# Patient Record
Sex: Female | Born: 1975 | Race: Black or African American | Hispanic: No | Marital: Single | Smoking: Current every day smoker
Health system: Southern US, Community
[De-identification: ages and names within clinical notes are randomized; demographics above are authoritative.]

## PROBLEM LIST (undated history)

## (undated) DIAGNOSIS — L732 Hidradenitis suppurativa: Secondary | ICD-10-CM

## (undated) DIAGNOSIS — M549 Dorsalgia, unspecified: Secondary | ICD-10-CM

## (undated) DIAGNOSIS — Z720 Tobacco use: Secondary | ICD-10-CM

## (undated) DIAGNOSIS — E669 Obesity, unspecified: Secondary | ICD-10-CM

## (undated) DIAGNOSIS — E119 Type 2 diabetes mellitus without complications: Secondary | ICD-10-CM

## (undated) DIAGNOSIS — F319 Bipolar disorder, unspecified: Secondary | ICD-10-CM

## (undated) DIAGNOSIS — F3289 Other specified depressive episodes: Secondary | ICD-10-CM

## (undated) DIAGNOSIS — J40 Bronchitis, not specified as acute or chronic: Secondary | ICD-10-CM

## (undated) DIAGNOSIS — O24419 Gestational diabetes mellitus in pregnancy, unspecified control: Secondary | ICD-10-CM

## (undated) DIAGNOSIS — F329 Major depressive disorder, single episode, unspecified: Secondary | ICD-10-CM

## (undated) HISTORY — DX: Tobacco use: Z72.0

## (undated) HISTORY — DX: Type 2 diabetes mellitus without complications: E11.9

## (undated) HISTORY — PX: TUBAL LIGATION: SHX77

## (undated) HISTORY — DX: Bipolar disorder, unspecified: F31.9

## (undated) HISTORY — DX: Major depressive disorder, single episode, unspecified: F32.9

## (undated) HISTORY — DX: Obesity, unspecified: E66.9

## (undated) HISTORY — DX: Other specified depressive episodes: F32.89

## (undated) HISTORY — DX: Hidradenitis suppurativa: L73.2

---

## 1998-02-19 ENCOUNTER — Emergency Department (HOSPITAL_COMMUNITY): Admission: EM | Admit: 1998-02-19 | Discharge: 1998-02-19 | Payer: Self-pay | Admitting: Internal Medicine

## 1999-06-16 ENCOUNTER — Other Ambulatory Visit: Admission: RE | Admit: 1999-06-16 | Discharge: 1999-06-16 | Payer: Self-pay | Admitting: Obstetrics and Gynecology

## 2000-10-23 ENCOUNTER — Emergency Department (HOSPITAL_COMMUNITY): Admission: EM | Admit: 2000-10-23 | Discharge: 2000-10-23 | Payer: Self-pay | Admitting: Emergency Medicine

## 2001-01-02 ENCOUNTER — Encounter: Admission: RE | Admit: 2001-01-02 | Discharge: 2001-01-02 | Payer: Self-pay | Admitting: Family Medicine

## 2001-01-02 ENCOUNTER — Other Ambulatory Visit: Admission: RE | Admit: 2001-01-02 | Discharge: 2001-01-02 | Payer: Self-pay | Admitting: *Deleted

## 2001-02-14 ENCOUNTER — Encounter: Admission: RE | Admit: 2001-02-14 | Discharge: 2001-02-14 | Payer: Self-pay | Admitting: Family Medicine

## 2001-02-14 ENCOUNTER — Other Ambulatory Visit: Admission: RE | Admit: 2001-02-14 | Discharge: 2001-02-14 | Payer: Self-pay | Admitting: Family Medicine

## 2001-02-24 ENCOUNTER — Encounter: Admission: RE | Admit: 2001-02-24 | Discharge: 2001-02-24 | Payer: Self-pay | Admitting: Family Medicine

## 2001-05-19 ENCOUNTER — Inpatient Hospital Stay (HOSPITAL_COMMUNITY): Admission: AD | Admit: 2001-05-19 | Discharge: 2001-05-19 | Payer: Self-pay | Admitting: *Deleted

## 2001-07-03 ENCOUNTER — Encounter: Admission: RE | Admit: 2001-07-03 | Discharge: 2001-07-03 | Payer: Self-pay | Admitting: Family Medicine

## 2001-08-04 ENCOUNTER — Emergency Department (HOSPITAL_COMMUNITY): Admission: EM | Admit: 2001-08-04 | Discharge: 2001-08-04 | Payer: Self-pay | Admitting: Emergency Medicine

## 2001-11-19 ENCOUNTER — Emergency Department (HOSPITAL_COMMUNITY): Admission: EM | Admit: 2001-11-19 | Discharge: 2001-11-19 | Payer: Self-pay | Admitting: Emergency Medicine

## 2002-06-11 ENCOUNTER — Encounter: Admission: RE | Admit: 2002-06-11 | Discharge: 2002-06-11 | Payer: Self-pay | Admitting: Family Medicine

## 2002-06-11 ENCOUNTER — Encounter (INDEPENDENT_AMBULATORY_CARE_PROVIDER_SITE_OTHER): Payer: Self-pay | Admitting: Specialist

## 2002-06-11 ENCOUNTER — Other Ambulatory Visit: Admission: RE | Admit: 2002-06-11 | Discharge: 2002-06-22 | Payer: Self-pay | Admitting: Family Medicine

## 2002-09-27 ENCOUNTER — Emergency Department (HOSPITAL_COMMUNITY): Admission: EM | Admit: 2002-09-27 | Discharge: 2002-09-27 | Payer: Self-pay | Admitting: Emergency Medicine

## 2002-09-28 ENCOUNTER — Encounter: Payer: Self-pay | Admitting: Emergency Medicine

## 2002-10-01 ENCOUNTER — Inpatient Hospital Stay (HOSPITAL_COMMUNITY): Admission: AD | Admit: 2002-10-01 | Discharge: 2002-10-01 | Payer: Self-pay | Admitting: Obstetrics and Gynecology

## 2002-11-13 ENCOUNTER — Inpatient Hospital Stay (HOSPITAL_COMMUNITY): Admission: EM | Admit: 2002-11-13 | Discharge: 2002-11-19 | Payer: Self-pay | Admitting: Psychiatry

## 2002-12-08 ENCOUNTER — Emergency Department (HOSPITAL_COMMUNITY): Admission: EM | Admit: 2002-12-08 | Discharge: 2002-12-08 | Payer: Self-pay | Admitting: Emergency Medicine

## 2003-01-15 ENCOUNTER — Inpatient Hospital Stay (HOSPITAL_COMMUNITY): Admission: EM | Admit: 2003-01-15 | Discharge: 2003-01-20 | Payer: Self-pay | Admitting: Psychiatry

## 2003-09-04 ENCOUNTER — Encounter: Admission: RE | Admit: 2003-09-04 | Discharge: 2003-09-04 | Payer: Self-pay | Admitting: Family Medicine

## 2003-09-11 ENCOUNTER — Encounter (INDEPENDENT_AMBULATORY_CARE_PROVIDER_SITE_OTHER): Payer: Self-pay | Admitting: Specialist

## 2003-09-11 ENCOUNTER — Other Ambulatory Visit: Admission: RE | Admit: 2003-09-11 | Discharge: 2003-09-11 | Payer: Self-pay | Admitting: Family Medicine

## 2003-09-11 ENCOUNTER — Encounter: Admission: RE | Admit: 2003-09-11 | Discharge: 2003-09-11 | Payer: Self-pay | Admitting: Family Medicine

## 2003-09-13 ENCOUNTER — Ambulatory Visit (HOSPITAL_COMMUNITY): Admission: RE | Admit: 2003-09-13 | Discharge: 2003-09-13 | Payer: Self-pay | Admitting: Family Medicine

## 2003-10-22 ENCOUNTER — Encounter: Admission: RE | Admit: 2003-10-22 | Discharge: 2003-10-22 | Payer: Self-pay | Admitting: Family Medicine

## 2003-11-01 ENCOUNTER — Encounter: Admission: RE | Admit: 2003-11-01 | Discharge: 2003-11-01 | Payer: Self-pay | Admitting: Family Medicine

## 2003-11-04 ENCOUNTER — Encounter: Admission: RE | Admit: 2003-11-04 | Discharge: 2003-11-04 | Payer: Self-pay | Admitting: Family Medicine

## 2003-11-12 ENCOUNTER — Encounter: Admission: RE | Admit: 2003-11-12 | Discharge: 2003-11-12 | Payer: Self-pay | Admitting: *Deleted

## 2003-11-19 ENCOUNTER — Encounter: Admission: RE | Admit: 2003-11-19 | Discharge: 2003-11-19 | Payer: Self-pay | Admitting: Family Medicine

## 2003-12-10 ENCOUNTER — Encounter: Admission: RE | Admit: 2003-12-10 | Discharge: 2003-12-10 | Payer: Self-pay | Admitting: Family Medicine

## 2003-12-10 ENCOUNTER — Ambulatory Visit (HOSPITAL_COMMUNITY): Admission: RE | Admit: 2003-12-10 | Discharge: 2003-12-10 | Payer: Self-pay | Admitting: Family Medicine

## 2003-12-13 ENCOUNTER — Encounter: Admission: RE | Admit: 2003-12-13 | Discharge: 2003-12-13 | Payer: Self-pay | Admitting: *Deleted

## 2003-12-17 ENCOUNTER — Encounter: Admission: RE | Admit: 2003-12-17 | Discharge: 2003-12-17 | Payer: Self-pay | Admitting: Obstetrics and Gynecology

## 2003-12-18 ENCOUNTER — Encounter: Admission: RE | Admit: 2003-12-18 | Discharge: 2003-12-18 | Payer: Self-pay | Admitting: Sports Medicine

## 2003-12-24 ENCOUNTER — Ambulatory Visit (HOSPITAL_COMMUNITY): Admission: RE | Admit: 2003-12-24 | Discharge: 2003-12-24 | Payer: Self-pay | Admitting: Sports Medicine

## 2003-12-24 ENCOUNTER — Encounter: Admission: RE | Admit: 2003-12-24 | Discharge: 2003-12-24 | Payer: Self-pay | Admitting: *Deleted

## 2003-12-27 ENCOUNTER — Encounter: Admission: RE | Admit: 2003-12-27 | Discharge: 2003-12-27 | Payer: Self-pay | Admitting: Family Medicine

## 2003-12-31 ENCOUNTER — Encounter: Admission: RE | Admit: 2003-12-31 | Discharge: 2003-12-31 | Payer: Self-pay | Admitting: *Deleted

## 2004-01-05 ENCOUNTER — Inpatient Hospital Stay (HOSPITAL_COMMUNITY): Admission: AD | Admit: 2004-01-05 | Discharge: 2004-01-08 | Payer: Self-pay | Admitting: *Deleted

## 2004-01-06 ENCOUNTER — Encounter (INDEPENDENT_AMBULATORY_CARE_PROVIDER_SITE_OTHER): Payer: Self-pay | Admitting: Specialist

## 2004-02-13 ENCOUNTER — Encounter: Admission: RE | Admit: 2004-02-13 | Discharge: 2004-02-13 | Payer: Self-pay | Admitting: Family Medicine

## 2004-02-18 ENCOUNTER — Encounter (INDEPENDENT_AMBULATORY_CARE_PROVIDER_SITE_OTHER): Payer: Self-pay | Admitting: *Deleted

## 2004-02-18 ENCOUNTER — Other Ambulatory Visit: Admission: RE | Admit: 2004-02-18 | Discharge: 2004-02-18 | Payer: Self-pay | Admitting: Family Medicine

## 2004-02-18 ENCOUNTER — Encounter: Admission: RE | Admit: 2004-02-18 | Discharge: 2004-02-18 | Payer: Self-pay | Admitting: Family Medicine

## 2004-03-16 ENCOUNTER — Encounter: Admission: RE | Admit: 2004-03-16 | Discharge: 2004-03-16 | Payer: Self-pay | Admitting: Sports Medicine

## 2004-04-30 ENCOUNTER — Other Ambulatory Visit: Admission: RE | Admit: 2004-04-30 | Discharge: 2004-04-30 | Payer: Self-pay | Admitting: Family Medicine

## 2004-04-30 ENCOUNTER — Ambulatory Visit: Payer: Self-pay | Admitting: Family Medicine

## 2004-11-03 ENCOUNTER — Emergency Department (HOSPITAL_COMMUNITY): Admission: EM | Admit: 2004-11-03 | Discharge: 2004-11-03 | Payer: Self-pay | Admitting: Emergency Medicine

## 2004-11-17 ENCOUNTER — Emergency Department (HOSPITAL_COMMUNITY): Admission: EM | Admit: 2004-11-17 | Discharge: 2004-11-17 | Payer: Self-pay | Admitting: Emergency Medicine

## 2005-03-22 ENCOUNTER — Ambulatory Visit: Payer: Self-pay | Admitting: Family Medicine

## 2005-06-18 ENCOUNTER — Emergency Department (HOSPITAL_COMMUNITY): Admission: EM | Admit: 2005-06-18 | Discharge: 2005-06-18 | Payer: Self-pay | Admitting: Emergency Medicine

## 2005-08-02 ENCOUNTER — Encounter (INDEPENDENT_AMBULATORY_CARE_PROVIDER_SITE_OTHER): Payer: Self-pay | Admitting: *Deleted

## 2005-08-02 LAB — CONVERTED CEMR LAB

## 2005-08-04 ENCOUNTER — Other Ambulatory Visit: Admission: RE | Admit: 2005-08-04 | Discharge: 2005-08-04 | Payer: Self-pay | Admitting: Family Medicine

## 2005-08-04 ENCOUNTER — Ambulatory Visit: Payer: Self-pay | Admitting: Family Medicine

## 2005-12-26 IMAGING — US US OB FOLLOW-UP
1 series · 13 of 28 positions shown · non-contrast
Comparison: none

CLINICAL DATA: 36 week 0 day assigned gestational age by prior ultrasound.  Gestational diabetes.  Evaluate fetal growth.
 OBSTETRICAL ULTRASOUND RE-EVALUATION
 Number of Fetuses: 1
 Heart Rate: 147 BPM
 Movement: Yes
 Breathing: Yes
 Presentation: Cephalic
 Placental Location: Anterior
 Grade: I
 Previa: No
 Amniotic Fluid (subjective): Normal
 Amniotic Fluid (objective): 13.0 cm AFI (1th-11th %ile = 7.7 to 24.9 cm for 36 weeks)

[Series 1: unknown · 0.35mm/px · 13 of 39 slices shown]
[im 2/39]
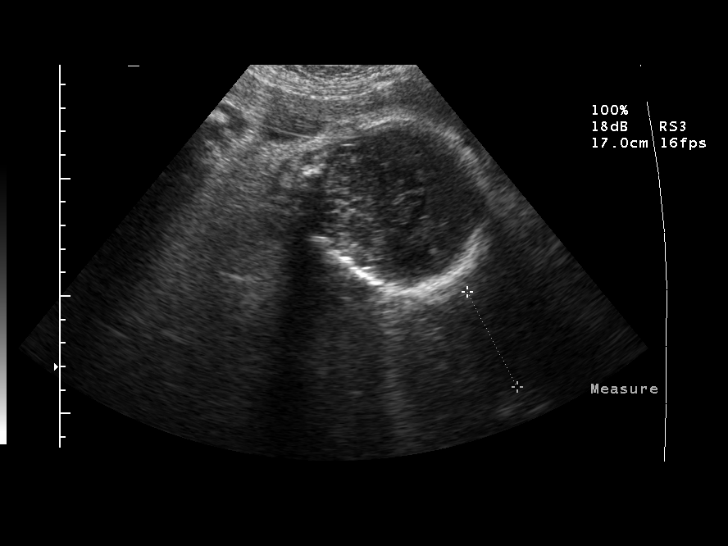
[im 5/39]
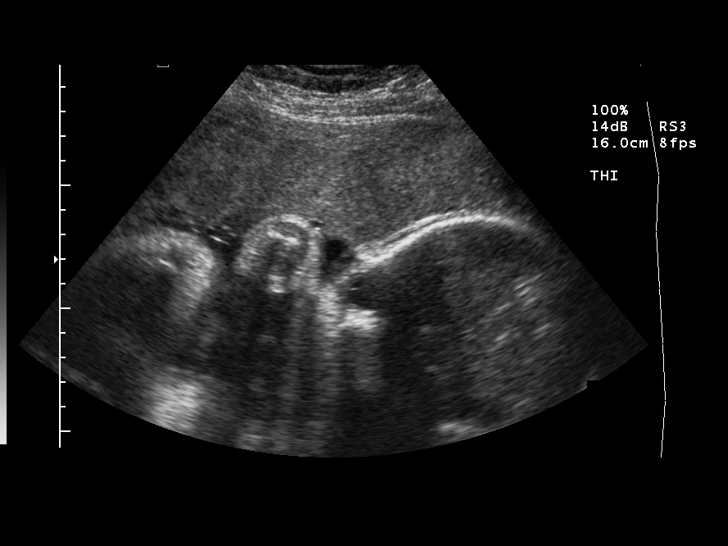
[im 8/39]
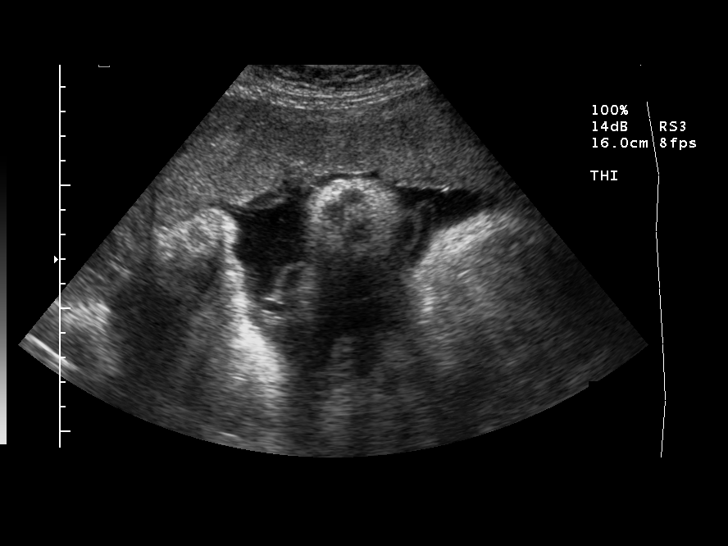
[im 10/39]
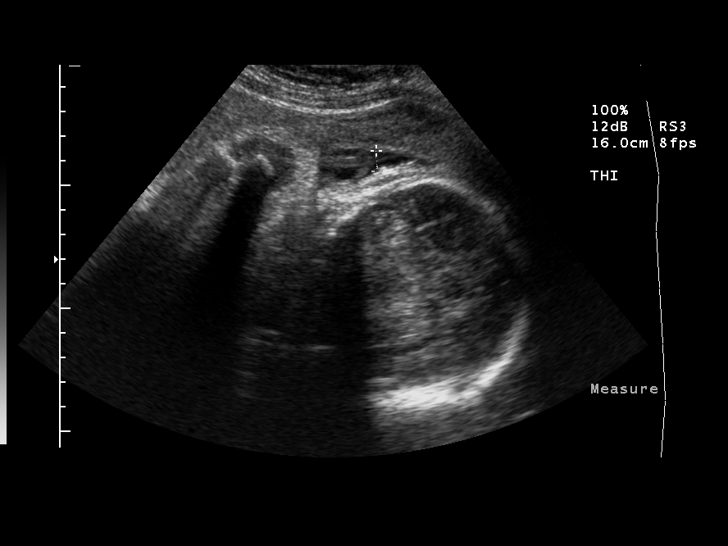
[im 13/39]
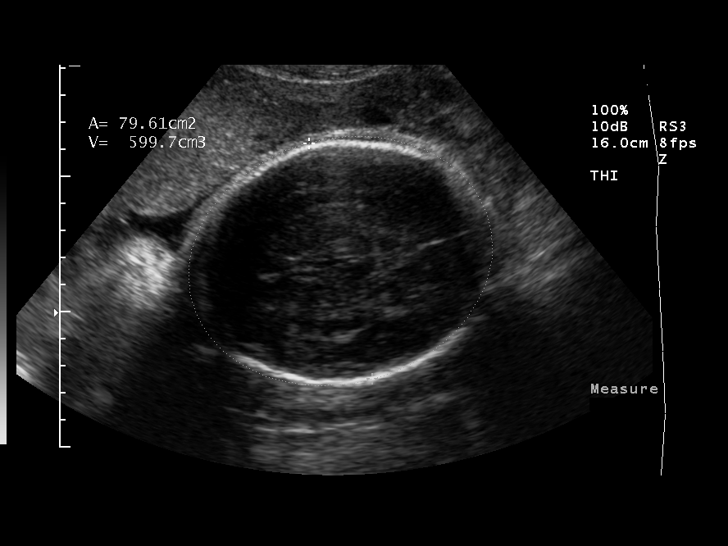
[im 16/39]
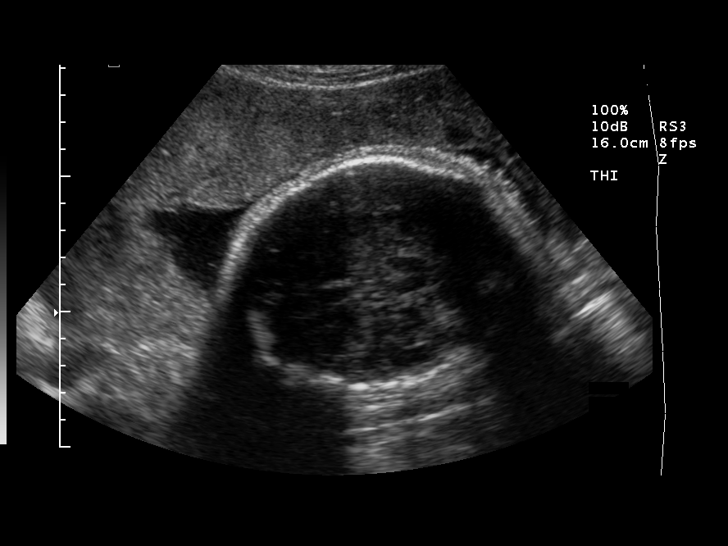
[im 20/39]
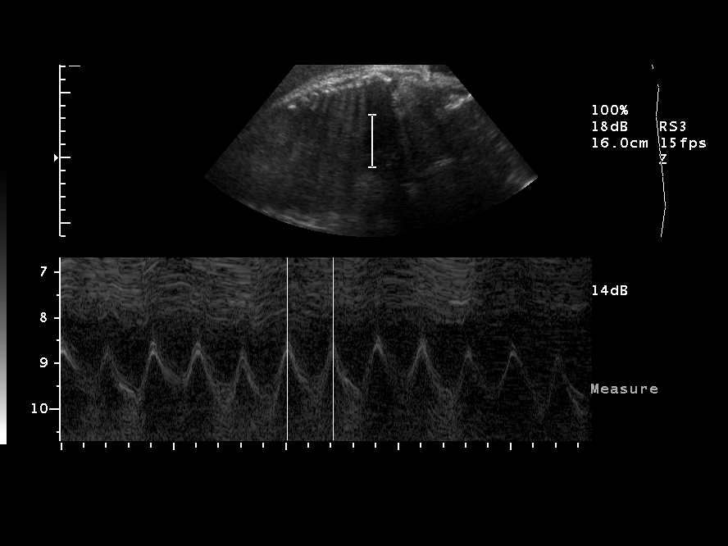
[im 23/39]
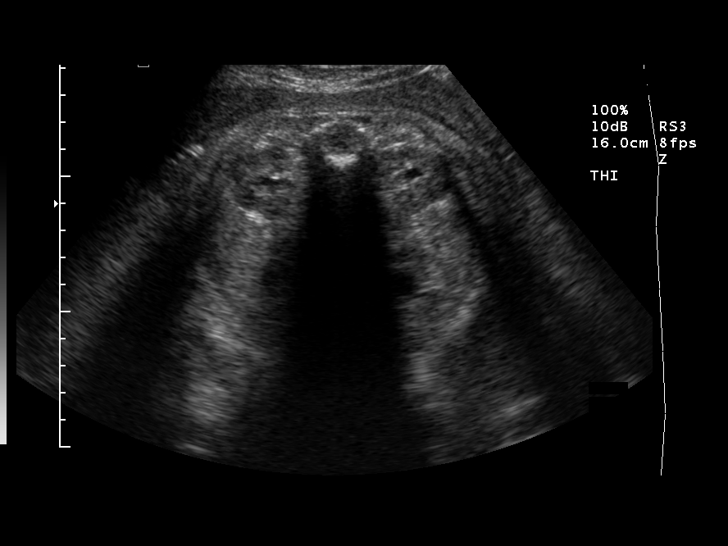
[im 26/39]
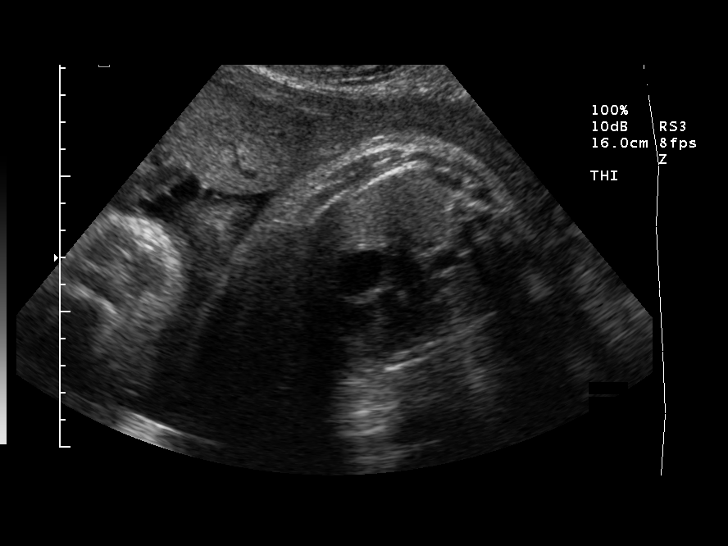
[im 29/39]
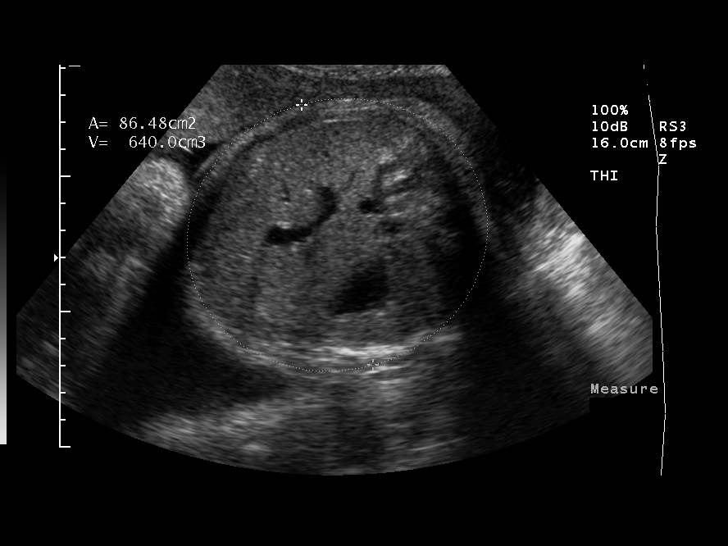
[im 31/39]
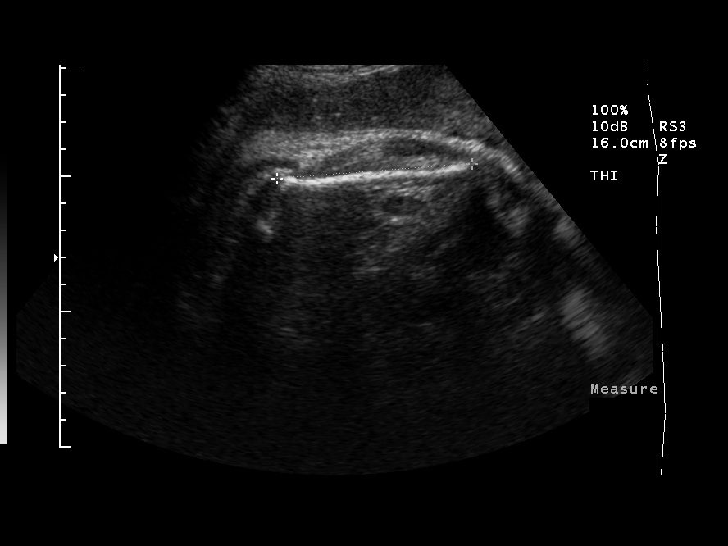
[im 34/39]
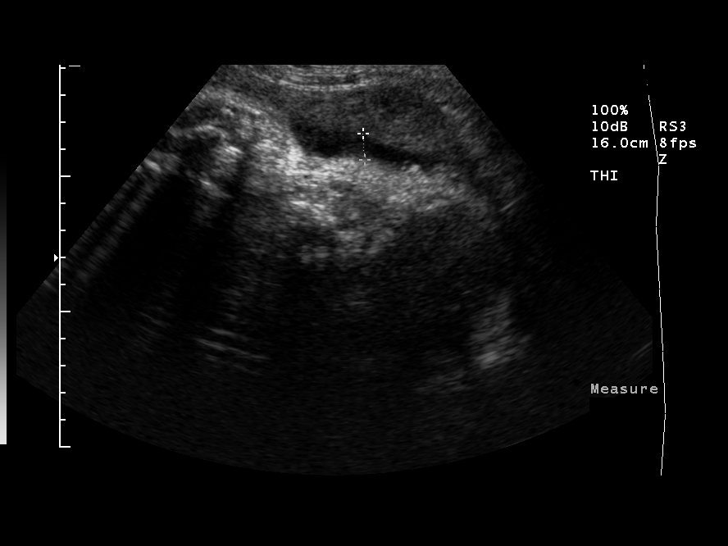
[im 37/39]
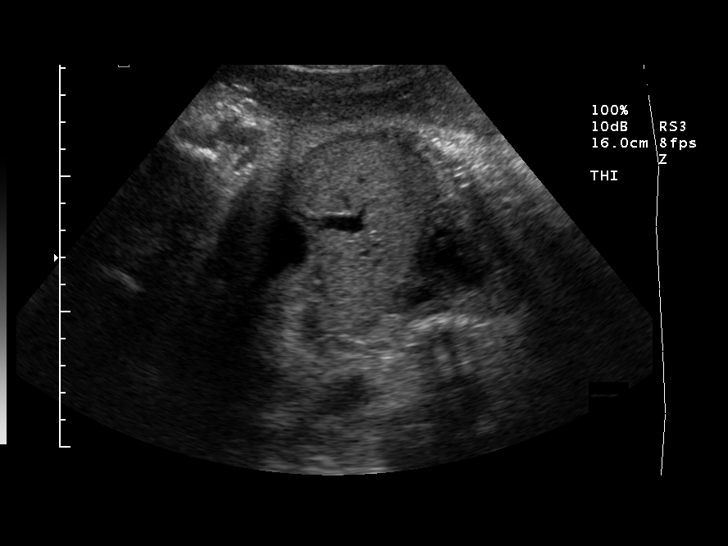

[13 of 28 positions shown; findings below may reference images not displayed]

FETAL BIOMETRY
 BPD: 8.9 cm, 36W 1D
 HC: 32.3 cm, 36W 4D
 AC: 33.0 cm, 37W 0D
 FL: 7.0 cm, 36W 0D

 Mean GA: 36W 3D
 Assigned GA:   36W 0D

 EFW: 9111 g 52th-05th %ile (2022-6068 g for 36 weeks)

 FETAL ANATOMY
 Lateral Ventricles: visualized 
 Thalami/CSP: visualized 
 Posterior Fossa: visualized 
 Nuchal Region: N/A
 Spine: not visualized   
 4 Chamber Heart on Left: visualized 
 Stomach on Left: visualized 
 3 Vessel Cord: visualized 
 Cord Insertion Site: previously seen   
 Kidneys: visualized 
 Bladder: visualized 
 Extremities: previously seen 
 MATERNAL FINDINGS
 Cervix: 4.5 cm transabdominally
IMPRESSION: Assigned gestational age is currently 36 weeks 0 days by prior ultrasound.  Appropriate fetal growth, with EFW slightly less than 75th percentile.
 Normal amniotic fluid volume.

## 2006-09-29 DIAGNOSIS — F329 Major depressive disorder, single episode, unspecified: Secondary | ICD-10-CM

## 2006-09-29 DIAGNOSIS — F17200 Nicotine dependence, unspecified, uncomplicated: Secondary | ICD-10-CM

## 2006-09-29 DIAGNOSIS — Z720 Tobacco use: Secondary | ICD-10-CM | POA: Insufficient documentation

## 2006-09-30 ENCOUNTER — Encounter (INDEPENDENT_AMBULATORY_CARE_PROVIDER_SITE_OTHER): Payer: Self-pay | Admitting: *Deleted

## 2006-11-23 ENCOUNTER — Ambulatory Visit: Payer: Self-pay | Admitting: Sports Medicine

## 2006-11-23 ENCOUNTER — Telehealth: Payer: Self-pay | Admitting: *Deleted

## 2006-11-23 DIAGNOSIS — N764 Abscess of vulva: Secondary | ICD-10-CM | POA: Insufficient documentation

## 2007-01-13 ENCOUNTER — Encounter (INDEPENDENT_AMBULATORY_CARE_PROVIDER_SITE_OTHER): Payer: Self-pay | Admitting: Family Medicine

## 2007-01-25 ENCOUNTER — Encounter (INDEPENDENT_AMBULATORY_CARE_PROVIDER_SITE_OTHER): Payer: Self-pay | Admitting: Family Medicine

## 2007-01-25 ENCOUNTER — Encounter: Payer: Self-pay | Admitting: Family Medicine

## 2007-01-25 ENCOUNTER — Other Ambulatory Visit: Admission: RE | Admit: 2007-01-25 | Discharge: 2007-01-25 | Payer: Self-pay | Admitting: Family Medicine

## 2007-01-25 ENCOUNTER — Ambulatory Visit: Payer: Self-pay | Admitting: Family Medicine

## 2007-01-25 DIAGNOSIS — F3162 Bipolar disorder, current episode mixed, moderate: Secondary | ICD-10-CM | POA: Insufficient documentation

## 2007-01-25 DIAGNOSIS — N760 Acute vaginitis: Secondary | ICD-10-CM | POA: Insufficient documentation

## 2007-01-25 LAB — CONVERTED CEMR LAB: Chlamydia, DNA Probe: NEGATIVE

## 2007-01-26 ENCOUNTER — Encounter (INDEPENDENT_AMBULATORY_CARE_PROVIDER_SITE_OTHER): Payer: Self-pay | Admitting: Family Medicine

## 2007-02-23 ENCOUNTER — Encounter: Payer: Self-pay | Admitting: Family Medicine

## 2007-06-16 ENCOUNTER — Ambulatory Visit: Payer: Self-pay | Admitting: Family Medicine

## 2007-06-16 ENCOUNTER — Encounter: Payer: Self-pay | Admitting: Family Medicine

## 2007-06-19 LAB — CONVERTED CEMR LAB
BUN: 11 mg/dL (ref 6–23)
CO2: 22 meq/L (ref 19–32)
Creatinine, Ser: 0.91 mg/dL (ref 0.40–1.20)
Glucose, Bld: 125 mg/dL — ABNORMAL HIGH (ref 70–99)
Potassium: 4.2 meq/L (ref 3.5–5.3)

## 2007-07-13 ENCOUNTER — Telehealth: Payer: Self-pay | Admitting: *Deleted

## 2008-10-31 ENCOUNTER — Encounter: Admission: RE | Admit: 2008-10-31 | Discharge: 2008-10-31 | Payer: Self-pay | Admitting: Family Medicine

## 2008-10-31 ENCOUNTER — Ambulatory Visit: Payer: Self-pay | Admitting: Family Medicine

## 2008-10-31 DIAGNOSIS — E669 Obesity, unspecified: Secondary | ICD-10-CM

## 2008-10-31 DIAGNOSIS — M545 Low back pain: Secondary | ICD-10-CM

## 2009-03-19 ENCOUNTER — Encounter: Payer: Self-pay | Admitting: Family Medicine

## 2009-03-19 ENCOUNTER — Ambulatory Visit: Payer: Self-pay | Admitting: Family Medicine

## 2009-03-19 ENCOUNTER — Encounter (INDEPENDENT_AMBULATORY_CARE_PROVIDER_SITE_OTHER): Payer: Self-pay | Admitting: Family Medicine

## 2009-03-19 ENCOUNTER — Other Ambulatory Visit: Admission: RE | Admit: 2009-03-19 | Discharge: 2009-03-19 | Payer: Self-pay | Admitting: Family Medicine

## 2009-03-24 ENCOUNTER — Encounter: Payer: Self-pay | Admitting: *Deleted

## 2009-03-24 LAB — CONVERTED CEMR LAB: Chlamydia, DNA Probe: NEGATIVE

## 2009-03-27 ENCOUNTER — Telehealth: Payer: Self-pay | Admitting: *Deleted

## 2009-06-12 ENCOUNTER — Ambulatory Visit: Payer: Self-pay | Admitting: Family Medicine

## 2009-06-12 DIAGNOSIS — L732 Hidradenitis suppurativa: Secondary | ICD-10-CM | POA: Insufficient documentation

## 2009-06-16 ENCOUNTER — Ambulatory Visit: Payer: Self-pay | Admitting: Family Medicine

## 2009-06-30 ENCOUNTER — Telehealth: Payer: Self-pay | Admitting: Family Medicine

## 2009-09-03 ENCOUNTER — Telehealth: Payer: Self-pay | Admitting: Family Medicine

## 2009-09-04 ENCOUNTER — Ambulatory Visit: Payer: Self-pay | Admitting: Family Medicine

## 2009-09-04 ENCOUNTER — Encounter: Payer: Self-pay | Admitting: Family Medicine

## 2009-09-04 DIAGNOSIS — R109 Unspecified abdominal pain: Secondary | ICD-10-CM

## 2009-09-04 LAB — CONVERTED CEMR LAB
Bilirubin Urine: NEGATIVE
Blood in Urine, dipstick: NEGATIVE
Chlamydia, DNA Probe: NEGATIVE
GC Probe Amp, Genital: NEGATIVE
Ketones, urine, test strip: NEGATIVE
Urobilinogen, UA: 0.2

## 2009-09-05 ENCOUNTER — Encounter: Payer: Self-pay | Admitting: Family Medicine

## 2009-09-08 ENCOUNTER — Ambulatory Visit: Payer: Self-pay | Admitting: Family Medicine

## 2010-05-12 ENCOUNTER — Telehealth: Payer: Self-pay | Admitting: Psychology

## 2010-06-03 ENCOUNTER — Ambulatory Visit: Payer: Self-pay | Admitting: Family Medicine

## 2010-06-03 ENCOUNTER — Telehealth: Payer: Self-pay | Admitting: Psychology

## 2010-06-11 ENCOUNTER — Ambulatory Visit: Payer: Self-pay | Admitting: Family Medicine

## 2010-06-11 ENCOUNTER — Encounter: Payer: Self-pay | Admitting: Family Medicine

## 2010-06-17 ENCOUNTER — Ambulatory Visit: Payer: Self-pay | Admitting: Family Medicine

## 2010-06-19 ENCOUNTER — Telehealth: Payer: Self-pay | Admitting: Psychology

## 2010-07-08 ENCOUNTER — Ambulatory Visit: Payer: Self-pay | Admitting: Family Medicine

## 2010-08-05 ENCOUNTER — Ambulatory Visit: Admit: 2010-08-05 | Payer: Self-pay

## 2010-09-01 NOTE — Assessment & Plan Note (Signed)
Summary: acute pelvic pain/[place]/alm   Vital Signs:  Patient profile:   35 year old female Height:      63 inches Weight:      210 pounds BMI:     37.33 BSA:     1.98 Temp:     98.6 degrees F Pulse rate:   99 / minute BP sitting:   136 / 94  Vitals Entered By: Jone Baseman CMA (September 04, 2009 10:40 AM) CC: pelvic pain x 1 day Is Patient Diabetic? No Pain Assessment Patient in pain? yes     Location: LLQ Intensity: 7   Primary Care Provider:  Ancil Boozer  MD  CC:  pelvic pain x 1 day.  History of Present Illness: 35 yo here for 1 day history of lower pelvic pain  States she has not had pain like this before.  Onset yesterday while having intercourse, immediately stopped but discomfort has continued since then.  No vaginal bleeding or discharge.  had an odor yesterday but has stopped.  Notes pain as sore, and throbbingg, worst in left lower pelvis.  Also this mornign starting having a "deep tingling" perhaps in vagina but she is unable to articulate this.   Has taken "pain pills" she was prescribed here for headache but I am unclear what this is.    Patient had BTL in 2005, no other abdominal surgeries.  States she uses  condoms but has a new partner.    ROS neg for fever, chills, dysuria, hematuria but she does note increased urinary frequency.  Habits & Providers  Alcohol-Tobacco-Diet     Tobacco Status: current     Tobacco Counseling: to quit use of tobacco products     Cigarette Packs/Day: 0.5  Allergies: No Known Drug Allergies  Review of Systems      See HPI General:  Denies fatigue, fever, loss of appetite, and weakness. GI:  Complains of gas; denies abdominal pain, change in bowel habits, diarrhea, nausea, and vomiting. GU:  Complains of urinary frequency; denies abnormal vaginal bleeding, discharge, dysuria, genital sores, hematuria, and incontinence.  Physical Exam  General:  Well-developed,well-nourished, mild distress, alert,appropriate and  cooperative throughout examination.  overweight. Abdomen:  tender to palpation greater in left pelvic area.  Mildy tender in right pelvic area.  No rebound or guarding.   No evidence of abdominal or inguinal hernia. Genitalia:  Normal introitus for age, no external lesions, thin watery vaginal discharge, mucosa pink and moist, no vaginal or cervical lesions, no vaginal atrophy, no friaility or hemorrhage, normal uterus size and position, no adnexal masses   cervical motion tenderness.     Impression & Recommendations:  Problem # 1:  PELVIC PAIN, ACUTE (ICD-789.09) Acute pelvic pain, not suspicious for STD but given pain and cervical motion tenderness with history of STD's will treat on clinical diagnosis of PID.  Possibly hemorrhagic corpus luteum cyst.   Patient also with a history of Genital herpes which may explain "tingling sensation" although nothing seen on exam and patient states this is not what it usually feels like.  Given rocpehin IM and doxycycline 100 two times a day x 10 days for PID, asked patient to return for recheck next week.  Advised that it is not uncommon to miss one period intermittantly during times of stress.  Will continue to monitor.  Orders: U Preg-[facility] (81025) Urinalysis-[facility] (00000) Wet Prep- [facility] [phone]) GC/Chlamydia-[facility] (87591/87491) Rocephin  250mg  (U0454) [facility]- Est Level  3 (09811)  Her updated medication list for  this problem includes:    Ibuprofen 600 Mg Tabs (Ibuprofen) .Marland Kitchen... Take one tablet every 6 hours as needed for pain  Complete Medication List: 1)  Bactrim Ds 800-160 Mg Tabs (Sulfamethoxazole-trimethoprim) .... One tab by mouth two times a day x 7 days then one tab by mouth daily starting one week before your period. 2)  Doxycycline Hyclate 100 Mg Caps (Doxycycline hyclate) .... One tablet twice a day for 10 days 3)  Ibuprofen 600 Mg Tabs (Ibuprofen) .... Take one tablet every 6 hours as needed for pain  Patient Instructions: 1)  Because you are  very tender in your cervix, we will treat you for pelvic inflammatory disease today with a shot and doxycycline pills 100 mg twice a day for 10 days.   2)  Make a follow-up appt on Mon-Tuesday for recheck. 3)  May use ibuprofen for pain/cramping. Prescriptions: IBUPROFEN 600 MG TABS (IBUPROFEN) take one tablet every 6 hours as needed for pain  #35 x 0   Entered and Authorized by:   Delbert Harness MD   Signed by:   Delbert Harness MD on 09/04/2009   Method used:   Electronically to        [facility] [address] [phone]* (retail)       [address] [place], [place]  [address]       Ph: [phone]       Fax: [phone]   RxID:   [id number] DOXYCYCLINE HYCLATE 100 MG CAPS (DOXYCYCLINE HYCLATE) one tablet twice a day for 10 days  #20 x 0   Entered and Authorized by:   Delbert Harness MD   Signed by:   Delbert Harness MD on 09/04/2009   Method used:   Electronically to        [facility] [address] [phone]* (retail)       [address] [place], [place]  [address]       Ph: [phone]       Fax: [phone]   RxID:   [id number]   Laboratory Results   Urine Tests  Date/Time Received: September 04, 2009 11:09 AM  Date/Time Reported: September 04, 2009 11:26 AM   Routine Urinalysis   Color: yellow Appearance: Clear Glucose: negative   (Normal Range: Negative) Bilirubin: negative   (Normal Range: Negative) Ketone: negative   (Normal Range: Negative) Spec. Gravity: 1.020   (Normal Range: 1.003-1.035) Blood: negative   (Normal Range: Negative) pH: 7.0   (Normal Range: 5.0-8.0) Protein: negative   (Normal Range: Negative) Urobilinogen: 0.2   (Normal Range: 0-1) Nitrite: negative   (Normal Range: Negative) Leukocyte Esterace: negative   (Normal Range: Negative)    Urine HCG: negative Comments: ...........test performed by..........Marland Kitchen [place], SMA  Date/Time Received: September 04, 2009 11:15 AM  Date/Time Reported: September 04, 2009 11:52 AM   [place] Source: vag WBC/hpf: Rare Bacteria/hpf: 2+ Rod and Cocci present Clue cells/hpf: none  Negative whiff Yeast/hpf: none Trichomonas/hpf: none Comments: ...........test performed by..........Marland Kitchen [place], SMA      Medication Administration  Injection # 1:    Medication: Rocephin  250mg     Diagnosis: PELVIC PAIN, ACUTE (ICD-789.09)    Route: IM    Site: LUOQ gluteus    Exp Date: 10/01/2010    Lot #: [id number]    Mfr: hospira    Patient tolerated injection without complications    Given by: Jone Baseman CMA (  September 04, 2009 12:09 PM)  Orders Added: 1)  U Preg-[facility] [81025] 2)  Urinalysis-[facility] [00000] 3)  Wet Prep- [facility] [87210] 4)  GC/Chlamydia-[facility] [87591/87491] 5)  Rocephin  250mg  [J0696] 6)  [facility]- Est Level  3 [62130]

## 2010-09-01 NOTE — Letter (Signed)
Summary: Out of School  [facility]  [address]   [place], [place] [address]   Phone: [phone]  Fax: [phone]    June 11, 2010   Student:  [name]    To Whom It May Concern:   For Medical reasons, please excuse the above named student from school for the following dates:  Start:   October 26th, 2011  End:    June 15, 2010  If you need additional information, please feel free to contact our office.   Sincerely,    Alvia Grove DO    ****This is a legal document and cannot be tampered with.  Schools are authorized to verify all information and to do so accordingly.

## 2010-09-01 NOTE — Assessment & Plan Note (Signed)
Summary: Initial [facility]   Primary Care Corban Kistler:  Julie Boozer  MD   History of Present Illness: Initial [facility] appt.  Patient called for the appt reporting she was coming off of a manic episode.  Taking Abilify 10 mg and Depakote 750 mg "when she needs it to come down."  History taking today was challenging secondary to a lack of emotional control, pressured speech and racing, tangential thoughts.  We did determine:  2004 - hospitalization at [facility] for schizophrenia.  She reported her mother has this diagnosis.  2008 - hospitalized involuntarily for mania and diagnosed with Bipolar Disorder.  She was "aggressive and sarcastic."  Thinks it was a mixed episode.  She thinks her Dad has Bipolar Disorder.  Report of symptomatology is scattered and tough to nail down.  See assessment for further details.  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  Reports daily THC use.  Usually about three times a day (morning, afternoon and night).  Denied use of cocaine and meth.  Denied use of prescription pills.  In order to feel better, she thinks she needs to: Come off of caffeine Take correct medications Structure for her week (weekly planner) Stop marijuana (but this was more our agenda than hers).  Doesn't want to be a "fat girl."  Doesn't want to take a medicine forever.  Is attending [employer] and thinks she needs special accommodations to be successful.  Has sought disability on at least two occasions.  Is likely going to consult with a lawyer.     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  Affect was tearful / sad.  Displayed pressured speech and thinking was tangential.  Elements of grandiosity.  Treatment team sensed volatility and patient report was consistent  (I can be aggressive).  Evidence of impulsivity as well.  Denied SI and HI.  Although risk is still present given her mental health issues, not immediate risk.  Close follow-up and plan was discussed.  Patient in agreement.  Meets criteria for Bipolar I disorder, current episode mixed based on her brief report of symptoms and history but mostly on her presentation.  Schizophrenia diagnosis that was made initially was likely revised based on presentation with mania in 2008.  No signs of schizophrenic symptoms outside of the mood issue as far as we can tell.  Patient seemed relieved to hear our diagnosis as she wanted to know "what is wrong with [her]" but at the same time did not want a label.  Also does not want to be treated like a [place] pig.  Will pay attention to this issue.  Did educate that we would expect she would need a daily medication for at least a year to determine efficacy.  This was news to her.  Clearly long-term treatment is warranted however creating expectations for one year out seemed reasonable today.  Treatment team strongly considered whether [facility] with our limited meeting times would be an appropriate place to initiate treatment.  Patient has had a bad (traumatic) experience at [facility] (involuntarily committed) and does not feel safe there.  Agreed to initiate a medication today and follow in one week with a plan in place for safety.  See patient instructions for further information.  Treatment team also educated on the detrimental  effects of marijuana, specifically that continued use would be incompatible with good mental health - especially given her Bipolar illness.  She voiced a desire to quit.  This will likely be more challenging than she anticipates.  Complete Medication List: 1)  Bactrim Ds 800-160 Mg Tabs (Sulfamethoxazole-trimethoprim) .... One tab by mouth two times a day x 7 days then one tab by mouth daily starting one week before your  period. 2)  Doxycycline Hyclate 100 Mg Caps (Doxycycline hyclate) .... One tablet twice a day for 10 days 3)  Ibuprofen 600 Mg Tabs (Ibuprofen) .... Take one tablet every 6 hours as needed for pain 4)  Abilify 10 Mg Tabs (Aripiprazole) .... Take one a night.  per dr. Kathrynn Running in mood disorder clinic. 5)  Abilify 5 Mg Tabs (Aripiprazole) .... After a few days on 10 mg a day, add one 5 mg pill a day.  per dr. Kathrynn Running in mood disorder clinic.  [phone]  Patient Instructions: 1)  Please schedule a follow-up appt for [facility] on November 9th at 9:00. 2)  Dr. Kathrynn Running started a medicine that you are familiar with called Abilify.  He recommended you take one 10 mg pill at night for several nights.  After two nights, please add one 5 mg pill of Abilify at night. 3)  A great website is:  [email or web address] 4)  If you are in crisis, please go to the [facility] or [facility].  Your safety is our biggest concern. 5)  Stopping the marijuana and taking a daily medication are two of the best things you can do to help restore a sense of balance in your life. 6)  Call [phone] with questions or concerns. 7)  This is a journey.  It will be helpful to keep that in mind.  There is no easy answer or fix.   Prescriptions: ABILIFY 5 MG TABS (ARIPIPRAZOLE) After a few days on 10 mg a day, add one 5 mg pill a day.  Per Dr. Kathrynn Running in [facility].  #30 x 0   Entered and Authorized by:   Spero Geralds PsyD   Signed by:   Spero Geralds PsyD on 06/03/2010   Method used:   Handwritten   RxID:   [id number] ABILIFY 10 MG TABS (ARIPIPRAZOLE) Take one a night.  Per Dr. Kathrynn Running in [facility].  #30 x 0   Entered and Authorized by:   Spero Geralds PsyD   Signed by:   Spero Geralds PsyD on 06/03/2010   Method used:   Handwritten   RxID:   [id number]    Orders Added: 1)  Diagnostic Interview- [facility] [32440]

## 2010-09-01 NOTE — Progress Notes (Signed)
Summary: triage   Phone Note Call from Patient Call back at Home Phone [phone]   Caller: Patient Summary of Call: having problem with bottom of her stomach and wants to come in today Initial call taken by: De Nurse,  September 03, 2009 10:15 AM  Follow-up for Phone Call        LM Follow-up by: Golden Circle RN,  September 03, 2009 10:15 AM  Additional Follow-up for Phone Call Additional follow up Details #1::        pain has subsided per pt. took a pain pill & is better. pain started after sex. lower abd hurts very bad.  appt made for 8:30am tomorrow. advised no sex until seen Additional Follow-up by: Golden Circle RN,  September 03, 2009 2:17 PM

## 2010-09-01 NOTE — Assessment & Plan Note (Signed)
Summary: [facility]DC appt/eo   Primary Care Provider:  Ancil Boozer  MD   History of Present Illness: Patient is taking 15 mg of Abilify with a few doses of Depakote she threw in to try to help "bring her down."  This has been effective but she is still "not there."  She remains restless and has difficulty focusing.   Denied AE.    She is attending school, reading about Bipolar Illness and getting support from her family.    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  Affect is consistent.  She is energetic but not labile like last visit.  Thoughts are clear and goal directed.  History is much easier to follow.  Modest benefit with Abilify 15 mg.  No evidence of SI / HI.  Options discussed:  Push Abilify dose; restart Depakote consistently; start Llithium.  [name] was not at all interested in increased Abilify dose.  She opted for Depakote but is concerned about dosing.  She is comfortable with an initial target dose of 500 mg.  On an importance scale, taking daily medicines for her Bipolar illness is an 8.  On a confidence scale, it is a 6.  Forgetting is an issue.  Taking the medicines together with a meal was discussed as potentially helpful.  Labs next visit.  Orders: Therapy 20-30 min- [facility] (04540) CBC-[facility] [phone]) Comp Met-[facility] (80053-22900)Future Orders: Valproic Acid-[facility] ([id number]) ... 06/04/2011  Complete Medication List: 1)  Doxycycline Hyclate 100 Mg Caps (Doxycycline hyclate) .... One tablet twice a day for 10 days 2)  Ibuprofen 600 Mg Tabs (Ibuprofen) .... Take one tablet every 6 hours as needed for pain 3)  Abilify 10 Mg Tabs (Aripiprazole) .... Take one a night.  per dr. Kathrynn Running in mood disorder clinic. 4)  Abilify 5 Mg Tabs (Aripiprazole) .... After a few days on 10 mg a day, add one 5 mg pill a day.  per dr. Kathrynn Running in mood disorder  clinic.  Patient Instructions: 1)  Please schedule a follow-up for December 7th at 11:30.  Please get your lab drawn that morning before you see [place]. 2)  Dr. Kathrynn Running left the Abilify the same.  We are glad you are feeling better. 3)  Dr. Kathrynn Running recommended you restart the Depakote at 500 mg.  Taking your medicines DAILY will have the best effect.   Glad you are onboard with the journey piece of this.  There will be steps forward and backward.  We are here to help. 4)  The Unquiet Mind and Touched [employer] by Jorene Minors are two recommended books.  Good luck!   Orders Added: 1)  Therapy 20-30 min- [facility] [90804] 2)  CBC-[facility] [85027] 3)  Comp Met-[facility] [30865-78469] 4)  Valproic Acid-[facility] [62952-84132]

## 2010-09-01 NOTE — Letter (Signed)
Summary: Results Follow-up Letter  [facility]  [address]   [place], [place] [address]   Phone: [phone]  Fax: [phone]    09/05/2009  [address] [place], [place]  [address]  Dear [name],   The following are the results of your recent test(s):  Your tests for gonorrhea and chlamydia are negative.  Sincerely,  Delbert Harness MD [facility]           Appended Document: Results Follow-up Letter mailed.

## 2010-09-01 NOTE — Progress Notes (Signed)
Summary: MDC appt   Phone Note Call from Patient Call back at Home Phone [phone]   Summary of Call: pt tried scheduling appt for [facility]DC on 11/9 but [facility] does not have a template for that date, please advise Initial call taken by: Knox Royalty,  June 03, 2010 12:05 PM  Follow-up for Phone Call        Indeed we are not meeting on the 9th - our mistake.  We are meeting on the 16th.  I phoned the patient and left a VM to call me to talk about an appt on the 16th.  Will schedule her then. Follow-up by: Spero Geralds PsyD,  June 03, 2010 1:02 PM     Appended Document: MDC appt Left VM with her new appt date and time.  Asked her to call to confirm.

## 2010-09-01 NOTE — Progress Notes (Signed)
Summary: Discuss mental health referral   Phone Note Outgoing Call   Call placed by: Spero Geralds PsyD,  May 12, 2010 1:51 PM Call placed to: Patient Summary of Call: Angelique Blonder from front office spoke to this woman about her mental health services.  The patient has been seen at [facility] but does not want to go back.  I reviewed the chart and called the patient.  Left a VM. Initial call taken by: Spero Geralds PsyD,  May 12, 2010 1:54 PM  Follow-up for Phone Call        Exchanged VMs again.  Additional Follow-up for Phone Call Additional follow up Details #1::        Finally connected this a.m.  Reports she has Bipolar Disorder with a recent manic episode.  She is sleeping better (more than four hours per night now) but the racing thoughts continue.  Going through a break-up as well.  Denied SI and HI.  Restarted Depakote and Abilify to help curb the mania but only takes it for as long as she thinks she needs it.  Thinks Abilify makes her sluggish and gain weight.  Wants to look at alternatives.  Scheduled [facility] for November 2nd at 11:00. Additional Follow-up by: Spero Geralds PsyD,  May 22, 2010 9:13 AM

## 2010-09-01 NOTE — Progress Notes (Signed)
Summary: [facility] Clinic   Phone Note Call from Patient   Caller: Patient Call For: Spero Geralds, Psy.D. Summary of Call: Patient called feeling overwhelmed.  Is taking 500 mg of Depakote and 15 mg of Abilify as prescribed.  Feels like people are going to know she is taking the medicine and think she is crazy.  Has to go to school but is afraid won't be able to answer questions.  Feeling overwhelmed and believes she has too much on her plate.  Discussed therapy.  She thought that is what [facility] was for.  We overlooked the therapy referral thus far (not sure how that happened).  My next opening is now into January.  Referred to [name] [phone]).  [name] says she is safe.  In our brief conversation, she was able to state she was "working [her]self up" and needed to focus on the next thing.  Would benefit from support of therapy relationship as well as some reality testing / structure.  Will let [name] know [name] may call. Initial call taken by: Spero Geralds PsyD,  June 19, 2010 11:14 AM
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
## 2010-09-01 NOTE — Assessment & Plan Note (Signed)
Summary: f/u eo   Vital Signs:  Patient profile:   35 year old female Height:      63 inches Weight:      207 pounds BMI:     36.80 BSA:     1.96 Temp:     98.4 degrees F Pulse rate:   80 / minute BP sitting:   133 / 85  Vitals Entered By: Jone Baseman CMA (September 08, 2009 8:42 AM) CC: f/u pelvic pain  Is Patient Diabetic? No Pain Assessment Patient in pain? yes     Location: stomach Intensity: 1   Primary Care Provider:  Ancil Boozer  MD  CC:  f/u pelvic pain .  History of Present Illness: 1) Pelvic pain: Pain started 09/03/09. Treated presumptively for PID (w/ cervical motion tenderness on exam)  w/ rocephin x 1 and doxycycline x 10 days, ibuprofen for pain. Pain has improved to the point that it is almost completely resolved. Started during sexual intercourse, then with constant discomfort. Pain was in her left lower abdomen - sore and throbbing. Denies lesions, discharge, bleeding, diarrhea, constipation, fevers/chills, back pain, dysuria, hematuria. Reports emesis x 1 episode after taking doxyxyxline (this is now resolved). LMP was 08/19/09 and lasted for one day.     Habits & Providers  Alcohol-Tobacco-Diet     Tobacco Status: current     Tobacco Counseling: to quit use of tobacco products     Cigarette Packs/Day: 1.0  Current Medications (verified): 1)  Bactrim Ds 800-160 Mg Tabs (Sulfamethoxazole-Trimethoprim) .... One Tab By Mouth Two Times A Day X 7 Days Then One Tab By Mouth Daily Starting One Week Before Your Period. 2)  Doxycycline Hyclate 100 Mg Caps (Doxycycline Hyclate) .... One Tablet Twice A Day For 10 Days 3)  Ibuprofen 600 Mg Tabs (Ibuprofen) .... Take One Tablet Every 6 Hours As Needed For Pain  Allergies (verified): No Known Drug Allergies  Social History: Packs/Day:  1.0  Physical Exam  General:  Well-developed,well-nourished, mild distress, alert,appropriate and cooperative throughout examination.  overweight. Abdomen:  mild tender to  palpation left pelvic area.  No rebound or guarding.   No evidence of abdominal or inguinal hernia. +BS   Impression & Recommendations:  Problem # 1:  PELVIC PAIN, ACUTE (ICD-789.09) Assessment Improved  Upreg, GC/CT negative at assessment on 09/04/09. Likely ovarian cyst now resolving. Continued pain control advised. Will have patient finish course of abx. Follow up as needed.  Her updated medication list for this problem includes:    Ibuprofen 600 Mg Tabs (Ibuprofen) .Marland Kitchen... Take one tablet every 6 hours as needed for pain  Orders: [facility]- Est Level  3 ([id number])  Complete Medication List: 1)  Bactrim Ds 800-160 Mg Tabs (Sulfamethoxazole-trimethoprim) .... One tab by mouth two times a day x 7 days then one tab by mouth daily starting one week before your period. 2)  Doxycycline Hyclate 100 Mg Caps (Doxycycline hyclate) .... One tablet twice a day for 10 days 3)  Ibuprofen 600 Mg Tabs (Ibuprofen) .... Take one tablet every 6 hours as needed for pain

## 2010-09-01 NOTE — Assessment & Plan Note (Signed)
[facility]   Primary Care Provider:  Ancil Boozer  MD   History of Present Illness: [name] reports taking 500 mg of Depakote with an occasional increase to 750 mg.  She has also sporadically increased her Abilify to 20 mg.  These have been without apparent side effects.  She reports five days in last two weeks of depressed mood.  Thirteen days in the last two weeks of "mania."  Biggest issue for her is focus - can't get her homework done.  She is able to meet the needs of her kids (from her perspective).    She reports she has decreased from three times a day marijuana use to using Friday and Saturday.  She went to the [facility] but it wasn't a good match for her.  She is seeing Letha Cape.    Allergies: No Known Drug Allergies   Impression & Recommendations:  Problem # 1:  BPLR I, MIXED, MOST RECENT EPSD, MODERATE (ICD-296.62)  Report of mood is moderate manic symptoms with racing thoughts and some decreased need for sleep.  Affect is consistent.  She is able to listen and take in information.  No evidence of suicidal or homicidal ideation.  Partially remitted mixed episode.  Less depression and manic symptoms remain present but seems to be responding to medication.  Sought to give her as much control as possible since she needs that in order to feel comfortable.  She agreed with the treatment plan (see patient instructions).  Collaborating on follow-up interval.  She was okay to wait until January 4th.  Will not get bloodwork today but will wait since we are increasing the Depakote.  Orders: Therapy 20-30 min- [facility] ([id number])  Complete Medication List: 1)  Ibuprofen 600 Mg Tabs (Ibuprofen) .... Take one tablet every 6 hours as needed for pain 2)  Abilify 10 Mg Tabs (Aripiprazole) .... Take one a night.  per dr[facility] Kathrynn Running [facility]. 3)  Abilify 5 Mg Tabs (Aripiprazole) .... After a few days on 10 mg a day, add one 5 mg pill a day.  per dr.  Kathrynn Running [facility]. 4)  Depakote 250 Mg Tbec (Divalproex sodium) .... Bid 5)  Bactrim Ds 800-160 Mg Tabs (Sulfamethoxazole-trimethoprim)  Patient Instructions: 1)  Please schedule an appt for 1/4 at 10:00. 2)  Please take 750 mg of Depakote.  Always with a meal. 3)  Dr. Kathrynn Running also asked you to increase your Abilify dose to 20 mg. 4)  You may call me crying in two to three days. 5)  Meeting with Judeth Horn is incredibly important to your treatment.  Look at your plate. See if there is anything that can be taken off.   Orders Added: 1)  Therapy 20-30 min- [facility] [[id number]]

## 2010-09-01 NOTE — Letter (Signed)
Summary: Out of School  [facility]  [address]   [place], [place] [address]   Phone: [phone]  Fax: [phone]    June 11, 2010   Student:  [name]    To Whom It May Concern:   For Medical reasons, please excuse the above named student from school for the following dates:  Start:   October  End:     If you need additional information, please feel free to contact our office.   Sincerely,    Alvia Grove DO    ****This is a legal document and cannot be tampered with.  Schools are authorized to verify all information and to do so accordingly.

## 2010-10-02 ENCOUNTER — Encounter: Payer: Self-pay | Admitting: Family Medicine

## 2010-10-02 ENCOUNTER — Other Ambulatory Visit: Payer: Self-pay | Admitting: Family Medicine

## 2010-10-02 ENCOUNTER — Ambulatory Visit (INDEPENDENT_AMBULATORY_CARE_PROVIDER_SITE_OTHER): Payer: Medicaid Other | Admitting: Family Medicine

## 2010-10-02 ENCOUNTER — Encounter: Payer: Self-pay | Admitting: *Deleted

## 2010-10-02 VITALS — BP 137/89 | HR 74 | Temp 99.0°F | Ht 63.0 in | Wt 200.5 lb

## 2010-10-02 DIAGNOSIS — R109 Unspecified abdominal pain: Secondary | ICD-10-CM

## 2010-10-02 DIAGNOSIS — R358 Other polyuria: Secondary | ICD-10-CM

## 2010-10-02 DIAGNOSIS — R102 Pelvic and perineal pain: Secondary | ICD-10-CM

## 2010-10-02 DIAGNOSIS — N760 Acute vaginitis: Secondary | ICD-10-CM

## 2010-10-02 DIAGNOSIS — N912 Amenorrhea, unspecified: Secondary | ICD-10-CM

## 2010-10-02 LAB — POCT WET PREP (WET MOUNT)
Clue Cells Wet Prep HPF POC: NEGATIVE
Trichomonas Wet Prep HPF POC: NEGATIVE

## 2010-10-02 NOTE — Assessment & Plan Note (Signed)
UA, Urine pregnancy test negative. Given prior history of STD will check GC/CT with concern for possible PID as cause, though no cervical motion tenderness or cervical discharge noted. Will check wet prep with vaginal discharge. Will check pelvic (transvaginal) ultrasound. Given lack of red flag symptoms no need for urgent imaging. Reviewed red flags with patient and handout given. Follow up if not improving. Advised regarding high dose ibuprofen to help alleviate pain and cramping

## 2010-10-02 NOTE — Progress Notes (Signed)
  Subjective:    Patient ID: [name], female    DOB: [birth year], 35 y.o.   MRN: [id number]  HPI 1) Pelvic pain: x past week. Left lower pelvis. Feels like cramping pain "similar to menstrual cramps". Partial relief with low-dose ibuprofen. Last menstrual period was mid January 2012 - periods are usually 30 days apart and last for 5 days and are intermediate flow. Had similar pain in early 2011 felt to be secondary to ovarian cyst. Reports some mild nausea, mild increase in urination and some vaginal tingling. She is concerned about possible STD today. Denies dysuria, vaginal discharge, odor, dyspareunia, emesis, diarrhea, constipation,  Patient is [id number]; 1 SAB at 6wks in 2004, GDM w/ G6 (2005), TAB x 2 (1998, 2003). History of multiple STD including HPV, HSV, chlamydia, trichomonas.    Review of Systems As per HPI otherwise negative     Objective:   Physical Exam  Abdominal: Soft. Normal appearance and bowel sounds are normal. She exhibits no distension and no mass. There is tenderness in the left lower quadrant. There is no rigidity, no rebound, no guarding, no CVA tenderness, no tenderness at McBurney's point and negative Murphy's sign. No hernia. Hernia confirmed negative in the right inguinal area and confirmed negative in the left inguinal area.  Genitourinary: Uterus normal. There is no rash, tenderness or lesion on the right labia. There is no rash, tenderness or lesion on the left labia. Cervix exhibits no motion tenderness, no discharge and no friability. Right adnexum displays no mass, no tenderness and no fullness. Left adnexum displays tenderness. Left adnexum displays no mass and no fullness. No erythema, tenderness or bleeding around the vagina. No foreign body around the vagina. Vaginal discharge found.  White vaginal discharge. Mild to moderate left adnexal tenderness.         Assessment & Plan:
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
## 2010-10-03 LAB — GC/CHLAMYDIA PROBE AMP, GENITAL: Chlamydia, DNA Probe: NEGATIVE

## 2010-10-05 ENCOUNTER — Telehealth: Payer: Self-pay | Admitting: Family Medicine

## 2010-10-05 NOTE — Telephone Encounter (Signed)
Checking status of ultrasound appt that is suppose to be made

## 2010-10-05 NOTE — Telephone Encounter (Signed)
Attempted to contact patient at number but it went VM.  Did not LM because when Asha called Thursday she spoke with an [place] but not the correct one and I did not feel comfortable leaving a message.  Will forward to admin with date of appt so when patient calls back she can get the info.  Appointment is 3.8.12 @ 9:45am @ [facility] Chael Urenda, [employer]

## 2010-10-08 ENCOUNTER — Ambulatory Visit (HOSPITAL_COMMUNITY)
Admission: RE | Admit: 2010-10-08 | Discharge: 2010-10-08 | Disposition: A | Discharge status: Home / Self Care | Payer: Medicaid Other | Source: Ambulatory Visit | Attending: Family Medicine | Admitting: Family Medicine

## 2010-10-08 DIAGNOSIS — N72 Inflammatory disease of cervix uteri: Secondary | ICD-10-CM | POA: Insufficient documentation

## 2010-10-08 DIAGNOSIS — R102 Pelvic and perineal pain: Secondary | ICD-10-CM

## 2010-10-08 DIAGNOSIS — N949 Unspecified condition associated with female genital organs and menstrual cycle: Secondary | ICD-10-CM | POA: Insufficient documentation

## 2010-10-08 DIAGNOSIS — D259 Leiomyoma of uterus, unspecified: Secondary | ICD-10-CM | POA: Insufficient documentation

## 2010-10-09 ENCOUNTER — Telehealth: Payer: Self-pay | Admitting: Family Medicine

## 2010-10-09 NOTE — Telephone Encounter (Signed)
Please call pt with results from U/S

## 2010-10-12 NOTE — Telephone Encounter (Signed)
Called patient and reviewed results of U/S
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
## 2010-12-18 NOTE — H&P (Signed)
NAME:  [name], [name]                         ACCOUNT NO.:  [id number]   MEDICAL RECORD NO.:  [id number]                   PATIENT TYPE:  IPS   LOCATION:  0401                                 [facility]ACILITY[facility]  [facility]   PHYSICIAN:  Jeanice Lim, M.D.              DATE OF BIRTH:  1976/06/24   DATE OF ADMISSION:  01/15/2003  DATE OF DISCHARGE:  01/20/2003                         PSYCHIATRIC ADMISSION ASSESSMENT   IDENTIFYING INFORMATION:  This is a 35 year old single African-American  female who was voluntarily admitted on January 15, 2003.   HISTORY OF PRESENT ILLNESS:  The patient presents with a history of  psychotic symptoms.  She was admitted with increased pacing, agitation,  irritability and paranoia.  She reports that she has been having little  sleep over the past three to four days.  She feels someone is coming to her  home at night ever since Ice, which is her boyfriend, had left.  She is  afraid that someone will hurt her.  She feels very afraid in this facility.  She states that someone is in the system and knows her whereabouts.  Her  stressors include that the patient has not seen her children recently.  She  is convinced that she is schizophrenic like her mother.   PAST PSYCHIATRIC HISTORY:  Second admission to behavioral health center.  She was hospitalized at [facility] in the past in May 2004 for psychotic  symptoms.   SOCIAL HISTORY:  She is a 35 year old single African-American female.  She  has two children, ages 89 and 71.  She lives with her children.  She lost her  job working at [employer].  She was fired for attendance  purposes, was working there for 10 months.  The patient has a court date in  September.   FAMILY HISTORY:  Mother schizophrenic.   ALCOHOL AND DRUG HISTORY:  The patient smokes a pack a day.  Reports alcohol  is not a problem.  No apparent drug use.  Primary care Brittan Butterbaugh is [facility].   MEDICAL PROBLEMS:  None.   MEDICATIONS:  The patient was taking Seroquel, but states that she did not  feel right.  It was prescribed by Dr. Dub Mikes and states she stopped her  medication three days ago.  Also was on Paxil CR 40 mg but states that she  felt too funny.   DRUG ALLERGIES:  No known allergies.   REVIEW OF SYSTEMS:  No cardiac or pulmonary problems, neurological, or  endocrine problems, GI or GU.  Last menstrual period was June 2004.  She has  a history of STDs, herpes.  No ear, nose, and throat problem,  musculoskeletal, or skin problems.  Denies any current pain.   PHYSICAL EXAMINATION:  VITAL SIGNS:  Temperature 99.3.  Heart 53.  Respirations 20.  Blood pressure 147/84.  She is 5 ft. 3 inches tall.  The  patient is  a 35 year old African-American female in no acute distress.  HEENT:  Normocephalic, atraumatic.  No nasal discharge.  No sinus  tenderness.  Negative lymphadenopathy.  CHEST:  Clear to auscultation.  HEART:  Regular rate and rhythm.  ABDOMEN:  Soft, nontender.  MUSCULOSKELETAL:  Muscle strength and tone is equal bilaterally.  No  swelling.  No deformities.  Cranial nerves are grossly intact.  NEUROLOGIC:  No neurological findings.   LABORATORY DATA:  CBC is within normal limits.  CMET is within normal  limits.  TSH is 0.558.  Urine pregnancy test is negative.  Urine drug screen  is positive for marijuana.  Urinalysis is negative.   MENTAL STATUS EXAM:  She is an alert young female.  She is cooperative.  Good eye contact.  Casually dressed.  Speech is clear.  Mood - the patient  feels afraid and anxious.  There is some mild guarding.  There is some  thought disorganization, paranoia evident.  She does not appear to be  responding to internal stimuli.  She voices no suicidal or homicidal  thoughts.  Cognitive function is intact.  Memory is intact.  Judgment and  insight are poor.   DIAGNOSES:   AXIS I:  Psychosis, not otherwise specified.   AXIS II:  Deferred.   AXIS III:   None.   AXIS IV:  Problems with primary support group, occupation, other  psychosocial problems.   AXIS V:  Current is 25; this past year 5.   PLAN:  Involuntary admission for psychotic symptoms.  Contract for safety.  Check every 15 minutes.  The patient will initially be placed on the 400  hall for close monitoring.  Will initiate Zyprexa to decrease psychotic  symptoms.  Will have Ativan available for anxiety.  Short term plan is to  stabilize her mood and thinking so patient can be safe and functional.  The  patient is to follow up with mental health, be medication compliant.  Tentative length of stay is four to five days depending on patient's  response to medication.     Landry Corporal, N.P.                       Jeanice Lim, M.D.    JO/MEDQ  D:  02/11/2003  T:  02/11/2003  Job:  [id number]

## 2010-12-18 NOTE — Op Note (Signed)
NAME:  [name], [name]                         ACCOUNT NO.:  [id number]   MEDICAL RECORD NO.:  [id number]                   PATIENT TYPE:  INP   LOCATION:  9120                                 [facility]ILITY:  [facility]   PHYSICIAN:  Phil D. Okey Dupre, M.D.                  DATE OF BIRTH:  [birth year]   DATE OF PROCEDURE:  01/06/2004  DATE OF DISCHARGE:                                 OPERATIVE REPORT   PREOPERATIVE DIAGNOSIS:  Voluntary sterilization.   POSTOPERATIVE DIAGNOSIS:  Voluntary sterilization.   PROCEDURE:  Bilateral tubal ligation and partial salpingectomy.   ANESTHESIA:  Epidural.   ESTIMATED BLOOD LOSS:  Less than 5 mL.   POSTOPERATIVE CONDITION:  Satisfactory.   SPECIMENS TO PATHOLOGY:  Sections of each fallopian tube.   The procedure went as follows:  Under satisfactory epidural anesthesia with  the patient in the dorsal supine position, the abdomen was prepped and  draped in the usual sterile manner.  The abdomen was entered through a  transverse subumbilical incision, extended for a total length of 4 cm, and  situated 1 cm below the umbilicus in a transverse configuration.  The  abdomen was entered by layers.  On entering the peritoneal cavity, each  fallopian tube was grasped in the midportion with a Babcock clamp and  opening made in the mesosalpinx beneath the tube in an avascular area and a  1 plain suture brought through this opening and tied around the distal and  proximal end of the tube to form a loop approximately 2 cm above the tie.  A  second tie was placed just below the aforementioned tie using the same  material, and a section of tube above the ties was excised and sent for  pathologic diagnosis.  The ends of the tubes thus exposed by the removal of  that portion were coagulated with hot cautery.  They were observed for  bleeding, none was noted, and the tubes were placed back into the peritoneal  cavity, which was closed with a continuous running 0 Vicryl on an  atraumatic  needle that included the peritoneum as well as the fascia.  This was  continued up to a subcuticular closure and then dry sterile dressings  applied.  The patient transferred to the recovery room in satisfactory  condition, having tolerated the procedure well.  Tape, instrument, sponge,  and needle count were reported correct at the end of the procedure.                                               Phil D. Okey Dupre, M.D.    PDR/MEDQ  D:  01/06/2004  T:  01/06/2004  Job:  [id number]

## 2010-12-18 NOTE — Group Therapy Note (Signed)
NAMETHREASA, KINCH NO.:  [id number]   MEDICAL RECORD NO.:  [id number]          PATIENT TYPE:  WOC   LOCATION:  [facility]                   FACILITY:  [facility]   PHYSICIAN:  Tinnie Gens, MD        DATE OF BIRTH:  1975-12-20   DATE OF SERVICE:  04/30/2004                                    CLINIC NOTE   CHIEF COMPLAINT:  Abnormal Pap.   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  The patient is status post BTL.   After appropriate consent was obtained, a colposcopy was performed with  acetic acid.  A small amount of acetowhite area was noted at the 5 o'clock  region.  The cervix was injected with 6 mL of lidocaine with epinephrine for  a cervical block.  A medium size loop was then used to perform the LEEP in  two swipes across the cervix for an anterior and a posterior portion.  The  bed of the LEEP was then cauterized with the electrocautery and Monsel's  used to achieve hemostasis.  The patient will return in 2 weeks.  She was  instructed to have pelvic rest.      TP/MEDQ  D:  04/30/2004  T:  04/30/2004  Job:  [id number]
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
## 2010-12-18 NOTE — Discharge Summary (Signed)
[name], [name]                         ACCOUNT NO.:  [id number]   MEDICAL RECORD NO.:  [id number]                   PATIENT TYPE:  INP   LOCATION:  9120                                 [facility]ILITY[facility]   PHYSICIAN:  Conni Elliot, M.D.             DATE OF BIRTH:  31-Mar-1976   DATE OF ADMISSION:  01/05/2004  DATE OF DISCHARGE:  01/08/2004                                 DISCHARGE SUMMARY   DISCHARGE DIAGNOSES:  1. Spontaneous vaginal delivery of a viable female.  2. Postpartum tubal ligation.   DISCHARGE MEDICATIONS:  1. Ibuprofen 600 mg one tablet p.o. q.6h. p.r.n. cramping.  2. Percocet 5/325 mg one to two pills q.6h. p.r.n. moderate to severe pain     #20 with no refills.  3. Prenatal vitamin one p.o. daily x6 weeks.  4. Simethicone 120 mg one tablet p.o. up to four times a day for gas.   DISCHARGE INSTRUCTIONS:  1. Wound care:  Per instruction booklet.  2. Diet:  Regular.  3. Activity:  No heavy lifting for 3 weeks and nothing in vagina for 6     weeks.  4. Follow-up:  Return to [facility] to see Dr. Dahlia Byes in     6 weeks; call for an appointment.   BRIEF ADMISSION HISTORY:  The patient is a 35 year old G6 P2-0-3-2 with late  prenatal care and suboptimally-controlled gestational diabetes who presented  at 79 and four-sevenths weeks to maternity admissions after spontaneous  rupture of membranes earlier that morning.  The patient had been taking  prenatal vitamin daily and Glucophage 500 mg p.o. b.i.d.  She was GBS  negative and her hepatitis B surface antigen, RPR, and HIV were nonreactive.  On admission her cervix was 3 cm dilated, 50% effaced, and the baby was at a  -2 station.  Fetal heart rate was reassuring with no decelerations and her  contractions were happening every 2-6 minutes.  She was admitted in active  labor.   HOSPITAL COURSE:  The patient progressed quite well and at 5-6 cm dilated,  80% effaced, and -1 station she began having  some variable decelerations for  which she was started on amnioinfusion with improvement in the variability  and resolution of the decelerations.  She was maintained on amnioinfusion  and continued to progress well.  Her blood sugars were within goal of 100-  150.   At 6:43 a.m. on January 06, 2004 the patient became complete and began pushing.  At 7:05 a.m. she delivered a viable female infant by SVD under epidural  anesthesia with mild shoulder dystocia resolved with McRoberts and  suprapubic pressure.  There was no nuchal cord.  The baby was taken to the  warmer with waiting R.N. and the NICU team arrived shortly thereafter.  Apgars were 6 at one minute, 8 at five minutes.  At 7:12 a.m. an intact  three-vessel-cord placenta was delivered spontaneously.  There were no  lacerations requiring suturing.  EBL was less than 500 mL.   Later on postpartum day #0 the patient underwent a bilateral tubal ligation  by Dr. Okey Dupre.  Please see the dictated operative note for further details.  The patient did tolerated the procedure well.   For the remainder of her hospitalization the patient recovered appropriately  and was ambulating without difficulty up to the NICU where her baby was  taken for low blood sugar and poor feeding.  Her pain control was good and  her postpartum hemoglobin fell to 9.9 from 11.9.  On the day of discharge  the patient's vital signs were stable.  Her BTL incision was clean, dry, and  intact with Steri-Strips.  Her fundus was firm below the umbilicus.  She did  complain of some mild gas pain which was improved.  Her lochia had decreased  and she continued to have good pain control and ambulate well.  She was  discharged home and is to follow up at [facility] in 6  weeks.  Of note, the patient needs to have a colposcopy done postpartum  secondary to history of high-grade SIL.   At the time of the patient's discharge her newborn remained in the NICU  secondary  to continued problems with feeding and low blood sugars.     Georgina Peer, M.D.                 Conni Elliot, M.D.    JM/MEDQ  D:  01/25/2004  T:  01/25/2004  Job:  [id number]

## 2010-12-18 NOTE — Discharge Summary (Signed)
NAME:  [name], [name]                         ACCOUNT NO.:  [id number]   MEDICAL RECORD NO.:  [id number]                   PATIENT TYPE:  IPS   LOCATION:  0401                                 FACILITY:  [facility]   PHYSICIAN:  Jeanice Lim, M.D.              DATE OF BIRTH:  October 19, 1975   DATE OF ADMISSION:  01/15/2003  DATE OF DISCHARGE:  01/20/2003                                 DISCHARGE SUMMARY   IDENTIFYING DATA:  A 35 year old single African-American female, voluntarily  admitted, presented with a history of psychosis, increased agitation,  irritability, paranoia, not sleeping for three to four days, afraid someone  was trying to hurt her.   MEDICATIONS:  1. Seroquel.  2. Paxil.   DRUG ALLERGIES:  No known drug allergies.   PHYSICAL EXAMINATION:  GENERAL:  Essentially within normal limits.  NEUROLOGICAL:  Nonfocal.   LABORATORY DATA:  Routine admission labs within normal limits.   MENTAL STATUS EXAM:  An alert, young female.  Good eye contact.  Casually  dressed.  Speech clear.  Mood afraid, anxious, depressed.  Thought processes  goal directed.  Thought content positive for paranoia.  Cognitively intact.  Judgment and insight poor.   ADMISSION DIAGNOSES:   AXIS I:  Psychosis, not otherwise specified.   AXIS II:  Deferred.   AXIS III:  None.   AXIS IV:  Moderate problems with primary support group and other  psychosocial issues.   AXIS V:  25/65.   HOSPITAL COURSE:  Patient was admitted, ordered routine p.r.n. medications,  underwent further monitoring, was requested to contract for safety every 15  minutes initially, and Zyprexa was started along with Ativan for anxiety to  stabilize mood and psychotic symptoms.  Patient was quite paranoid, wanting  to be discharged, felt there might be a plot, and that M.D. and Dr. May were  possibly involved, suspicious.  Patient continued to want to leave.  Was  initially seen by Dr. Dub Mikes and then Dr. Kathrynn Running.  Sat labile.   Patient had  multiple p.r.n. medications of Ativan, Zyprexa secondary to agitation.  Reported a decrease in paranoia, although appeared still quite guarded and  suspicious on January 19, 2003.  On January 20, 2003, there was improvement of  insight, patient was more calm but still somewhat suspicious with racial  themes, clearly paranoid, but more reality based.  On January 20, 2003, patient  had shown significant improvement, reporting wanting to be discharged,  reported positive insight, realizing that she did have an illness, that she  needed to take medications, and that she would be complaint.  Her paranoia  had almost resolved.  Patient was appropriate, showing good judgment and  insight, and discharge planning was pursued.   CONDITION AT DISCHARGE:  Markedly improved.  Patient was less paranoid.  Mood was more stable, affect brighter, thought process goal directed,  thought content negative for dangerous ideation or psychotic  symptoms.   DISCHARGE MEDICATIONS:  1. Zyprexa Zydis 5 mg q.a.m. and 10 mg q.h.s.  2. Ativan 0.5 mg one-half q.8h. p.r.n. agitation.   DISCHARGE INSTRUCTIONS:  Patient was advised not to stop Zyprexa, be  complaint with the medications.   FOLLOW UP:  Patient was to be followed up by the ACT team at [facility].   DISCHARGE DIAGNOSES:   AXIS I:  Psychosis, not otherwise specified.   AXIS II:  Deferred.   AXIS III:  None.   AXIS IV:  Moderate problems with primary support group and other  psychosocial issues.   AXIS V:  Global assessment of functioning on discharge was 50 to 55.                                               Jeanice Lim, M.D.    JEM/MEDQ  D:  02/13/2003  T:  02/14/2003  Job:  [id number]

## 2011-05-31 ENCOUNTER — Emergency Department (HOSPITAL_COMMUNITY)
Admission: EM | Admit: 2011-05-31 | Discharge: 2011-06-01 | Disposition: A | Discharge status: Home / Self Care | Payer: Medicaid Other | Attending: Emergency Medicine | Admitting: Emergency Medicine

## 2011-05-31 ENCOUNTER — Emergency Department (HOSPITAL_COMMUNITY): Payer: Medicaid Other

## 2011-05-31 DIAGNOSIS — R079 Chest pain, unspecified: Secondary | ICD-10-CM | POA: Insufficient documentation

## 2011-05-31 DIAGNOSIS — T148XXA Other injury of unspecified body region, initial encounter: Secondary | ICD-10-CM | POA: Insufficient documentation

## 2011-05-31 DIAGNOSIS — J40 Bronchitis, not specified as acute or chronic: Secondary | ICD-10-CM | POA: Insufficient documentation

## 2011-05-31 DIAGNOSIS — X58XXXA Exposure to other specified factors, initial encounter: Secondary | ICD-10-CM | POA: Insufficient documentation

## 2011-05-31 LAB — POCT PREGNANCY, URINE: Preg Test, Ur: NEGATIVE

## 2011-06-01 LAB — CBC
MCV: 77.1 fL — ABNORMAL LOW (ref 78.0–100.0)
RDW: 13.9 % (ref 11.5–15.5)
WBC: 9.4 10*3/uL (ref 4.0–10.5)

## 2011-06-01 LAB — URINE MICROSCOPIC-ADD ON

## 2011-06-01 LAB — POCT I-STAT, CHEM 8
BUN: 10 mg/dL (ref 6–23)
Calcium, Ion: 1.21 mmol/L (ref 1.12–1.32)
Creatinine, Ser: 0.9 mg/dL (ref 0.50–1.10)
Glucose, Bld: 137 mg/dL — ABNORMAL HIGH (ref 70–99)
HCT: 43 % (ref 36.0–46.0)
Hemoglobin: 14.6 g/dL (ref 12.0–15.0)
TCO2: 26 mmol/L (ref 0–100)

## 2011-06-01 LAB — URINALYSIS, ROUTINE W REFLEX MICROSCOPIC
Leukocytes, UA: NEGATIVE
Nitrite: NEGATIVE
Protein, ur: NEGATIVE mg/dL

## 2011-06-01 LAB — DIFFERENTIAL
Eosinophils Absolute: 0.3 10*3/uL (ref 0.0–0.7)
Eosinophils Relative: 3 % (ref 0–5)
Lymphocytes Relative: 53 % — ABNORMAL HIGH (ref 12–46)
Monocytes Relative: 6 % (ref 3–12)
Neutrophils Relative %: 38 % — ABNORMAL LOW (ref 43–77)

## 2011-11-05 ENCOUNTER — Telehealth: Payer: Self-pay | Admitting: *Deleted

## 2011-11-05 ENCOUNTER — Encounter (HOSPITAL_COMMUNITY): Payer: Self-pay | Admitting: Family Medicine

## 2011-11-05 ENCOUNTER — Emergency Department (HOSPITAL_COMMUNITY)
Admission: EM | Admit: 2011-11-05 | Discharge: 2011-11-05 | Disposition: A | Discharge status: Home / Self Care | Payer: Medicaid Other | Attending: Emergency Medicine | Admitting: Emergency Medicine

## 2011-11-05 DIAGNOSIS — E119 Type 2 diabetes mellitus without complications: Secondary | ICD-10-CM | POA: Insufficient documentation

## 2011-11-05 DIAGNOSIS — Z79899 Other long term (current) drug therapy: Secondary | ICD-10-CM | POA: Insufficient documentation

## 2011-11-05 DIAGNOSIS — R739 Hyperglycemia, unspecified: Secondary | ICD-10-CM

## 2011-11-05 DIAGNOSIS — H538 Other visual disturbances: Secondary | ICD-10-CM | POA: Insufficient documentation

## 2011-11-05 DIAGNOSIS — R350 Frequency of micturition: Secondary | ICD-10-CM | POA: Insufficient documentation

## 2011-11-05 DIAGNOSIS — F3130 Bipolar disorder, current episode depressed, mild or moderate severity, unspecified: Secondary | ICD-10-CM | POA: Insufficient documentation

## 2011-11-05 DIAGNOSIS — E669 Obesity, unspecified: Secondary | ICD-10-CM | POA: Insufficient documentation

## 2011-11-05 HISTORY — DX: Gestational diabetes mellitus in pregnancy, unspecified control: O24.419

## 2011-11-05 HISTORY — DX: Bronchitis, not specified as acute or chronic: J40

## 2011-11-05 LAB — BLOOD GAS, VENOUS
Bicarbonate: 23.3 mEq/L (ref 20.0–24.0)
FIO2: 0.21 %
O2 Saturation: 57.9 %
Patient temperature: 98.6
TCO2: 20.9 mmol/L (ref 0–100)
pH, Ven: 7.372 — ABNORMAL HIGH (ref 7.250–7.300)

## 2011-11-05 LAB — BASIC METABOLIC PANEL
BUN: 9 mg/dL (ref 6–23)
CO2: 22 mEq/L (ref 19–32)
Chloride: 98 mEq/L (ref 96–112)
Creatinine, Ser: 0.68 mg/dL (ref 0.50–1.10)
Glucose, Bld: 402 mg/dL — ABNORMAL HIGH (ref 70–99)
Potassium: 4.8 mEq/L (ref 3.5–5.1)

## 2011-11-05 LAB — GLUCOSE, CAPILLARY

## 2011-11-05 MED ORDER — PROPOFOL 10 MG/ML IV EMUL
INTRAVENOUS | Status: AC
  Filled 2011-11-05: qty 20

## 2011-11-05 MED ORDER — PROPOFOL 10 MG/ML IV BOLUS: INTRAVENOUS | Status: AC | PRN

## 2011-11-05 MED ORDER — METFORMIN HCL ER 750 MG PO TB24: 750.0000 mg | ORAL_TABLET | Freq: Every day | ORAL | Status: DC

## 2011-11-05 MED ORDER — SODIUM CHLORIDE 0.9 % IV BOLUS (SEPSIS)
2000.0000 mL | Freq: Once | INTRAVENOUS | Status: AC
  Administered 2011-11-05: 1000 mL via INTRAVENOUS

## 2011-11-05 NOTE — Telephone Encounter (Signed)
Pt is calling because she is tested her BS (325) @ 1230 today because she had been having increased urination and thirst. She stated that her vision has been blurry. She has not had anything to eat or drink and would like to come in to be seen. She also stated that this has been occuring for the past 2 weeks but today is the only day that she has checked her BS. She has had a hx of gestational DM 7 years ago.Laureen Ochs, Viann Shove

## 2011-11-05 NOTE — ED Provider Notes (Addendum)
History     CSN: [id number]  Arrival date & time 11/05/11  1526   First MD Initiated Contact with Patient 11/05/11 1618      Chief Complaint  Patient presents with  . Hyperglycemia    (Consider location/radiation/quality/duration/timing/severity/associated sxs/prior treatment) HPI Patient reports thirsty with increased urination and blurred vision for approximately 2 weeks. She checked her blood sugar today was noted to be 345 at 12:30 PM. Patient otherwise asymptomatic and feels well. Diagnosed with hyperglycemia 2 weeks ago. No medications started. After checking her blood sugar this afternoon she drank a pepsi and ate a chicken sandwich. Called her PMD at [facility] family practice who advised her to come here. Patient otherwise asymptomatic no treatment prior to coming here Past Medical History  Diagnosis Date  . Hidradenitis suppurativa   . Depressive disorder, not elsewhere classified   . Tobacco abuse   . Bipolar 1 disorder   . Obesity   . Gestational diabetes   . Bronchitis     Past Surgical History  Procedure Date  . Tubal ligation     Family History  Problem Relation Age of Onset  . Hypertension Mother   . Schizophrenia Mother   . Hypertension Father     History  Substance Use Topics  . Smoking status: Current Everyday Smoker -- 0.5 packs/day    Types: Cigarettes  . Smokeless tobacco: Not on file  . Alcohol Use: No   marijuana use  OB History    Grav Para Term Preterm Abortions TAB SAB Ect Mult Living                  Review of Systems  Constitutional: Negative.   HENT: Negative.   Eyes: Positive for visual disturbance.  Respiratory: Negative.   Cardiovascular: Negative.   Gastrointestinal: Negative.   Musculoskeletal: Negative.   Skin: Negative.   Neurological: Negative.   Hematological: Negative.   Psychiatric/Behavioral: Negative.   All other systems reviewed and are negative.    Allergies  Review of patient's allergies indicates no  known allergies.  Home Medications   Current Outpatient Rx  Name Route Sig Dispense Refill  . ARIPIPRAZOLE 10 MG PO TABS Oral Take 10 mg by mouth daily.    Marland Kitchen DIPHENHYDRAMINE-APAP (SLEEP) 25-500 MG PO TABS Oral Take 1 tablet by mouth at bedtime as needed. For night time pain relief    . DIVALPROEX SODIUM 500 MG PO TBEC Oral Take 500 mg by mouth daily.      BP 132/93  Pulse 101  Temp(Src) 99 F (37.2 C) (Oral)  Resp 16  Wt 195 lb (88.451 kg)  SpO2 100%  LMP 10/05/2011  Physical Exam  Constitutional: She is oriented to person, place, and time. She appears well-developed and well-nourished.  HENT:  Head: Normocephalic and atraumatic.       Mucous membranes slightly dry  Eyes: Conjunctivae are normal. Pupils are equal, round, and reactive to light.  Neck: Neck supple. No tracheal deviation present. No thyromegaly present.  Cardiovascular: Normal rate and regular rhythm.   No murmur heard. Pulmonary/Chest: Effort normal and breath sounds normal.  Abdominal: Soft. Bowel sounds are normal. She exhibits no distension. There is no tenderness.       Obeseobese  Musculoskeletal: Normal range of motion. She exhibits no edema and no tenderness.  Neurological: She is alert and oriented to person, place, and time. Coordination normal.  Skin: Skin is warm and dry. No rash noted.  Psychiatric: She has a normal mood  and affect.    ED Course  Procedures (including critical care time)  Labs Reviewed  GLUCOSE, CAPILLARY - Abnormal; Notable for the following:    Glucose-Capillary 530 (*)    All other components within normal limits   No results found.   No diagnosis found.  8:50 PM patient asymptomatic. Vision is now normal patient resting comfortably alert Glasgow Coma Score 15  MDM  Plan prescription metformin Patient advised to avoid sweets Follow up with [facility] next week Diagnosis hyperglycemia        Doug Sou, MD 11/05/11 2055  Doug Sou, MD 11/06/11 1610

## 2011-11-05 NOTE — Discharge Instructions (Signed)
Hyperglycemia Hyperglycemia occurs when the glucose (sugar) in your blood is too high. Hyperglycemia can happen for many reasons, but it most often happens to people who do not know they have diabetes or are not managing their diabetes properly.  CAUSES  Whether you have diabetes or not, there are other causes of hyperglycemia. Hyperglycemia can occur when you have diabetes, but it can also occur in other situations that you might not be as aware of, such as: Diabetes  If you have diabetes and are having problems controlling your blood glucose, hyperglycemia could occur because of some of the following reasons:   Not following your meal plan.   Not taking your diabetes medications or not taking it properly.   Exercising less or doing less activity than you normally do.   Being sick.  Pre-diabetes  This cannot be ignored. Before people develop Type 2 diabetes, they almost always have "pre-diabetes." This is when your blood glucose levels are higher than normal, but not yet high enough to be diagnosed as diabetes. Research has shown that some long-term damage to the body, especially the heart and circulatory system, may already be occurring during pre-diabetes. If you take action to manage your blood glucose when you have pre-diabetes, you may delay or prevent Type 2 diabetes from developing.  Stress  If you have diabetes, you may be "diet" controlled or on oral medications or insulin to control your diabetes. However, you may find that your blood glucose is higher than usual in the hospital whether you have diabetes or not. This is often referred to as "stress hyperglycemia." Stress can elevate your blood glucose. This happens because of hormones put out by the body during times of stress. If stress has been the cause of your high blood glucose, it can be followed regularly by your caregiver. That way he/she can make sure your hyperglycemia does not continue to get worse or progress to diabetes.    Steroids  Steroids are medications that act on the infection fighting system (immune system) to block inflammation or infection. One side effect can be a rise in blood glucose. Most people can produce enough extra insulin to allow for this rise, but for those who cannot, steroids make blood glucose levels go even higher. It is not unusual for steroid treatments to "uncover" diabetes that is developing. It is not always possible to determine if the hyperglycemia will go away after the steroids are stopped. A special blood test called an A1c is sometimes done to determine if your blood glucose was elevated before the steroids were started.  SYMPTOMS  Thirsty.   Frequent urination.   Dry mouth.   Blurred vision.   Tired or fatigue.   Weakness.   Sleepy.   Tingling in feet or leg.  DIAGNOSIS  Diagnosis is made by monitoring blood glucose in one or all of the following ways:  A1c test. This is a chemical found in your blood.   Fingerstick blood glucose monitoring.   Laboratory results.  TREATMENT  First, knowing the cause of the hyperglycemia is important before the hyperglycemia can be treated. Treatment may include, but is not be limited to:  Education.   Change or adjustment in medications.   Change or adjustment in meal plan.   Treatment for an illness, infection, etc.   More frequent blood glucose monitoring.   Change in exercise plan.   Decreasing or stopping steroids.   Lifestyle changes.  HOME CARE INSTRUCTIONS   Test your blood glucose  as directed.   Exercise regularly. Your caregiver will give you instructions about exercise. Pre-diabetes or diabetes which comes on with stress is helped by exercising.   Eat wholesome, balanced meals. Eat often and at regular, fixed times. Your caregiver or nutritionist will give you a meal plan to guide your sugar intake.   Being at an ideal weight is important. If needed, losing as little as 10 to 15 pounds may help  improve blood glucose levels.  SEEK MEDICAL CARE IF:   You have questions about medicine, activity, or diet.   You continue to have symptoms (problems such as increased thirst, urination, or weight gain).  SEEK IMMEDIATE MEDICAL CARE IF:   You are vomiting or have diarrhea.   Your breath smells fruity.   You are breathing faster or slower.   You are very sleepy or incoherent.   You have numbness, tingling, or pain in your feet or hands.   You have chest pain.   Your symptoms get worse even though you have been following your caregiver's orders.   If you have any other questions or concerns.  Document Released: 01/12/2001 Document Revised: 07/08/2011 Document Reviewed: 03/10/2009 [facility] Patient Information 2012 [place], [place].   Call the family practice Center on 11/08/2011 to arrange to be seen next week. Return if your condition worsens for any reason. Avoid all sweets. Drain diet beveragesHyperglycemia Hyperglycemia occurs when the glucose (sugar) in your blood is too high. Hyperglycemia can happen for many reasons, but it most often happens to people who do not know they have diabetes or are not managing their diabetes properly.  CAUSES  Whether you have diabetes or not, there are other causes of hyperglycemia. Hyperglycemia can occur when you have diabetes, but it can also occur in other situations that you might not be as aware of, such as: Diabetes  If you have diabetes and are having problems controlling your blood glucose, hyperglycemia could occur because of some of the following reasons:   Not following your meal plan.   Not taking your diabetes medications or not taking it properly.   Exercising less or doing less activity than you normally do.   Being sick.  Pre-diabetes  This cannot be ignored. Before people develop Type 2 diabetes, they almost always have "pre-diabetes." This is when your blood glucose levels are higher than normal, but not yet high enough  to be diagnosed as diabetes. Research has shown that some long-term damage to the body, especially the heart and circulatory system, may already be occurring during pre-diabetes. If you take action to manage your blood glucose when you have pre-diabetes, you may delay or prevent Type 2 diabetes from developing.  Stress  If you have diabetes, you may be "diet" controlled or on oral medications or insulin to control your diabetes. However, you may find that your blood glucose is higher than usual in the hospital whether you have diabetes or not. This is often referred to as "stress hyperglycemia." Stress can elevate your blood glucose. This happens because of hormones put out by the body during times of stress. If stress has been the cause of your high blood glucose, it can be followed regularly by your caregiver. That way he/she can make sure your hyperglycemia does not continue to get worse or progress to diabetes.  Steroids  Steroids are medications that act on the infection fighting system (immune system) to block inflammation or infection. One side effect can be a rise in blood glucose. Most people  can produce enough extra insulin to allow for this rise, but for those who cannot, steroids make blood glucose levels go even higher. It is not unusual for steroid treatments to "uncover" diabetes that is developing. It is not always possible to determine if the hyperglycemia will go away after the steroids are stopped. A special blood test called an A1c is sometimes done to determine if your blood glucose was elevated before the steroids were started.  SYMPTOMS  Thirsty.   Frequent urination.   Dry mouth.   Blurred vision.   Tired or fatigue.   Weakness.   Sleepy.   Tingling in feet or leg.  DIAGNOSIS  Diagnosis is made by monitoring blood glucose in one or all of the following ways:  A1c test. This is a chemical found in your blood.   Fingerstick blood glucose monitoring.   Laboratory  results.  TREATMENT  First, knowing the cause of the hyperglycemia is important before the hyperglycemia can be treated. Treatment may include, but is not be limited to:  Education.   Change or adjustment in medications.   Change or adjustment in meal plan.   Treatment for an illness, infection, etc.   More frequent blood glucose monitoring.   Change in exercise plan.   Decreasing or stopping steroids.   Lifestyle changes.  HOME CARE INSTRUCTIONS   Test your blood glucose as directed.   Exercise regularly. Your caregiver will give you instructions about exercise. Pre-diabetes or diabetes which comes on with stress is helped by exercising.   Eat wholesome, balanced meals. Eat often and at regular, fixed times. Your caregiver or nutritionist will give you a meal plan to guide your sugar intake.   Being at an ideal weight is important. If needed, losing as little as 10 to 15 pounds may help improve blood glucose levels.  SEEK MEDICAL CARE IF:   You have questions about medicine, activity, or diet.   You continue to have symptoms (problems such as increased thirst, urination, or weight gain).  SEEK IMMEDIATE MEDICAL CARE IF:   You are vomiting or have diarrhea.   Your breath smells fruity.   You are breathing faster or slower.   You are very sleepy or incoherent.   You have numbness, tingling, or pain in your feet or hands.   You have chest pain.   Your symptoms get worse even though you have been following your caregiver's orders.   If you have any other questions or concerns.  Document Released: 01/12/2001 Document Revised: 07/08/2011 Document Reviewed: 03/10/2009 [facility] Patient Information 2012 [place], [place].

## 2011-11-05 NOTE — ED Notes (Signed)
Shoulder reduced 

## 2011-11-05 NOTE — ED Notes (Signed)
MD at bedside. 

## 2011-11-05 NOTE — ED Notes (Signed)
Pt reports CBG was 335 at home at 12:30 today. Pt states that she does not feel bad but for the past 2 weeks she has been having increased thirst, urination, and blurred vision. Reports having gestational diabetes x8 years ago, but has not had any problems since.

## 2011-11-05 NOTE — Telephone Encounter (Signed)
Spoke with patient and  advised her to go to Urgent Care today to be evaluated. Consulted with Dr. Leveda Anna

## 2011-11-08 LAB — BLOOD GAS, VENOUS

## 2011-11-17 ENCOUNTER — Ambulatory Visit: Payer: Medicaid Other | Admitting: Family Medicine

## 2011-12-07 ENCOUNTER — Ambulatory Visit (INDEPENDENT_AMBULATORY_CARE_PROVIDER_SITE_OTHER): Payer: Medicaid Other | Admitting: Family Medicine

## 2011-12-07 ENCOUNTER — Encounter: Payer: Self-pay | Admitting: Family Medicine

## 2011-12-07 VITALS — BP 108/76 | HR 88 | Temp 98.7°F | Ht 63.0 in | Wt 190.0 lb

## 2011-12-07 DIAGNOSIS — E1169 Type 2 diabetes mellitus with other specified complication: Secondary | ICD-10-CM

## 2011-12-07 DIAGNOSIS — E1165 Type 2 diabetes mellitus with hyperglycemia: Secondary | ICD-10-CM | POA: Insufficient documentation

## 2011-12-07 DIAGNOSIS — F3162 Bipolar disorder, current episode mixed, moderate: Secondary | ICD-10-CM

## 2011-12-07 DIAGNOSIS — R062 Wheezing: Secondary | ICD-10-CM | POA: Insufficient documentation

## 2011-12-07 DIAGNOSIS — E669 Obesity, unspecified: Secondary | ICD-10-CM

## 2011-12-07 DIAGNOSIS — E119 Type 2 diabetes mellitus without complications: Secondary | ICD-10-CM

## 2011-12-07 DIAGNOSIS — E785 Hyperlipidemia, unspecified: Secondary | ICD-10-CM

## 2011-12-07 DIAGNOSIS — R739 Hyperglycemia, unspecified: Secondary | ICD-10-CM

## 2011-12-07 DIAGNOSIS — R7309 Other abnormal glucose: Secondary | ICD-10-CM

## 2011-12-07 LAB — LIPID PANEL
Cholesterol: 201 mg/dL — ABNORMAL HIGH (ref 0–200)
HDL: 42 mg/dL (ref >39)
Triglycerides: 213 mg/dL — ABNORMAL HIGH (ref <150)

## 2011-12-07 LAB — COMPREHENSIVE METABOLIC PANEL
Albumin: 4.1 g/dL (ref 3.5–5.2)
BUN: 9 mg/dL (ref 6–23)
CO2: 24 mEq/L (ref 19–32)
Glucose, Bld: 340 mg/dL — ABNORMAL HIGH (ref 70–99)
Potassium: 4.2 mEq/L (ref 3.5–5.3)
Sodium: 134 mEq/L — ABNORMAL LOW (ref 135–145)
Total Protein: 6.6 g/dL (ref 6.0–8.3)

## 2011-12-07 MED ORDER — ONETOUCH ULTRASOFT LANCETS MISC: Status: DC

## 2011-12-07 MED ORDER — GLUCOSE BLOOD VI STRP: ORAL_STRIP | Status: DC

## 2011-12-07 MED ORDER — GLIPIZIDE 5 MG PO TABS: 5.0000 mg | ORAL_TABLET | Freq: Two times a day (BID) | ORAL | Status: DC

## 2011-12-07 MED ORDER — ALBUTEROL SULFATE HFA 108 (90 BASE) MCG/ACT IN AERS: 2.0000 | INHALATION_SPRAY | Freq: Four times a day (QID) | RESPIRATORY_TRACT | Status: DC | PRN

## 2011-12-07 MED ORDER — METFORMIN HCL ER (OSM) 1000 MG PO TB24: 2000.0000 mg | ORAL_TABLET | Freq: Every day | ORAL | Status: DC

## 2011-12-07 NOTE — Patient Instructions (Signed)
Please take one pill of metformin and one pill of glipizide daily for one week Next week increase to 2 pills of metformin  Take your blood sugar every morning before you eat I would like your blood sugar to be less than 200 to start  If after 2 weeks your blood sugar is still greater than 200, increase to 2 pills of glipizide  Come back and see me in 2 weeks. Be seen sooner if you are having symptoms  Monitoring for Diabetes There are two blood tests that help you monitor and manage your diabetes. These include:  An A1c (hemoglobin A1c) test.   This test is an average of your glucose (or blood sugar) control over the past 3 months.   This is recommended as a way for you and your caregiver to understand how well your glucose levels are controlled on the average.   Your A1c goal will be determined by your caregiver, but it is usually best if it is less than 6.5% to 7.0%.   Glucose (sugar) attaches itself to red blood cells. The amount of glucose then can then be measured. The amount of glucose on the cells depends on how high your blood glucose has been.   SMBG test (self-monitoring blood glucose).   Using a blood glucose monitor (meter) to do SMBG testing is an easy way to monitor the amount of glucose in your blood and can help you improve your control. The monitor will tell you what your blood glucose is at that very moment. Every person with diabetes should have a blood glucose monitor and know how to use it. The better you control your blood sugar on a daily basis, the better your A1c levels will be.  HOW OFTEN SHOULD I HAVE AN A1C LEVEL?  Every 3 months if your diabetes is not well controlled or if therapy has changed.   Every 6 months if you are meeting your treatment goals.  HOW OFTEN SHOULD I DO SMBG TESTING?   Your caregiver will recommend how often you should test. Testing times are based on the kind of medicine you take, type of diabetes you have, and your blood glucose  control. Testing times can include:  Type 1 diabetes: test 3 or 4 times a day or as directed.   Type 2 diabetes and if you are taking insulin and diabetes pills: test 3 or 4 times a day or as directed.   If you are taking diabetes pills only and not reaching your target A1c: test 2 to 4 times a day or as directed.   If you are taking diabetes pills and are controlling your diabetes well with diet and exercise, your caregiver will help you decide what is appropriate.  WHAT TIME OF DAY SHOULD I TEST?   The best time of day to test your blood glucose depends on medications, mealtimes, exercise, and blood glucose control. It is best to test at different times because this will help you know how you are doing throughout the day. Your caregiver will help you decide what is best. WHAT SHOULD MY BLOOD GLUCOSE BE? Blood glucose target goals may vary depending on each persons needs, whether they have type 1 or type 2 diabetes or what medications they are taking. However, as a general rule, blood glucose should be:  Before meals...70-130 mg/dl.   After meals .Marland Kitchen...less than 180 mg/dl.  CHECK YOUR BLOOD GLUCOSE IF:  You have symptoms of low blood sugar (hypoglycemia), which may include dizziness,  shaking, sweating, chills and confusion.   You have symptoms of high blood sugar (hyperglycemia), which may include sleepiness, blurred vision, frequent urination and excessive thirst.   You are learning how meals, physical activity and medicine affect your blood glucose level. The more you learn about how various foods, your medications, and activities affect you, the better job you will do of taking care of yourself.   You have a job in which poor control could cause safety problems while driving or operating machinery.  CHECK YOUR BLOOD SUGAR MORE FREQUENTLY:  If you have medication or dietary changes.   If you begin taking other kinds of medicines.   If you become sick or your level of stress  increases. With an illness, your blood sugar may even be high without eating.   Before and after exercise.  Follow your caregiver's testing recommendations during this time.   TO DISPOSE OF SHARPS: Each city or state may have different regulations. Check with your public works or Engineer, structural.  Sharps containers can be purchased from [employer] all used sharps in a container. You do not need to replace any protective covers over the needle or break the needle.   Sharps should be contained in a ridge, leakproof, puncture-resistant container.   Plastic detergent bottle.   Bleach bottle.   When container is almost full, add a solution that is 1 part laundry bleach and 9 parts tap water (it is ok to use undiluted bleach if you wish). You may want to wear gloves since bleach can damage tissue. Let the solution sit for 30 minutes.   Carefully pour all the liquid into the sanitary sewer. Be sure to prevent the sharps from falling out.   Once liquid is drained, reseal the container with lid and tape it shut with duct tape. This will prevent the cap from coming off.   Dispose of the container with your regular household trash and waste. It is a good idea to let your trash hauler know that you will be disposing of sharps.  Document Released: 07/22/2003 Document Revised: 07/08/2011 Document Reviewed: 01/20/2009 [facility] Patient Information 2012 [place], [place].

## 2011-12-09 ENCOUNTER — Telehealth: Payer: Self-pay | Admitting: Family Medicine

## 2011-12-09 NOTE — Telephone Encounter (Signed)
Will route request to Dr. Hulen Luster.  Gaylene Brooks, RN

## 2011-12-09 NOTE — Telephone Encounter (Addendum)
Called pharmacy and was told that medicaid does not pay for One Touch Ultra strips any longer. They will pay for Accu Chek. Advised patient that I will get Dr. Hulen Luster to write Rx for accu-chek Nano and can give her a card for a free meter. Will ask Dr. Hulen Luster to write Rx for strips and lancets for Nano also and she can plan to  pick up tomorrow afternoon

## 2011-12-09 NOTE — Telephone Encounter (Signed)
Need to have new test strips (Accuchek)rx sent to pharmacy.  These are Medicaid approved and only cost 3.00.  The others were more expensive.

## 2011-12-10 DIAGNOSIS — E785 Hyperlipidemia, unspecified: Secondary | ICD-10-CM | POA: Insufficient documentation

## 2011-12-10 MED ORDER — PRAVASTATIN SODIUM 20 MG PO TABS: 20.0000 mg | ORAL_TABLET | Freq: Every day | ORAL | Status: DC

## 2011-12-10 MED ORDER — ACCU-CHEK FASTCLIX LANCET KIT: 1.0000 | PACK | Freq: Every day | Status: DC

## 2011-12-10 MED ORDER — ACCU-CHEK NANO SMARTVIEW W/DEVICE KIT: 1.0000 | PACK | Freq: Once | Status: DC

## 2011-12-10 MED ORDER — GLUCOSE BLOOD VI STRP: ORAL_STRIP | Status: DC

## 2011-12-10 NOTE — Progress Notes (Signed)
  Subjective:    Patient ID: [name], female    DOB: 05-21-1976, 36 y.o.   MRN: [id number]  HPI Patient is following up from a trip to the ED for weeks ago. At that time she was diagnosed with diabetes. She was started on metformin but only given 14 day supply. She's been out of it for 2 weeks. She had an early appointment but she had to cancel it with [place]. She has had increased urination and increased thirst as well as blurred vision. She has not had any chest pain, shortness of breath, headache. She checks her blood sugar at work and her last blood sugar was 369.  Patient is bipolar but she is not on her medications now. She says that she does not like the way they make her feel. She has not had any manic episodes or depressive episodes. She says that she is aware of when she is getting sick and she can take her medicines then. She has a doctor that she can follow with at that time. She says that she feels safe at home and denies SI, HI  Smoking-patient states that she smokes daily. She says that this makes her wheeze and she has an albuterol inhaler help with this. She knows that she needs to quit smoking and she is over this idea. She is not considered how she wants to quit.  Smoker, bipolar. Review of Systems Denies CP, SOB, HA, N/V/D, fever     Objective:   Physical Exam Vital signs reviewed General appearance - alert, well appearing, and in no distress Moist mucous membrane Heart - normal rate, regular rhythm, normal S1, S2, no murmurs, rubs, clicks or gallops Chest - clear to auscultation, no wheezes, rales or rhonchi, symmetric air entry, no tachypnea, retractions or cyanosis Feet-normal, no suspicious lesions, normal nerves sensation       Assessment & Plan:

## 2011-12-10 NOTE — Telephone Encounter (Signed)
Sent in rx to pharmacy.

## 2011-12-10 NOTE — Assessment & Plan Note (Signed)
Will start Pravachol today. See back in 4 weeks for recheck.

## 2011-12-10 NOTE — Assessment & Plan Note (Signed)
Patient with wheezing triggered by smoking. We discussed that smoking cessation would be best for her. She's agreed to talk to Dr. Raymondo Band about this.

## 2011-12-10 NOTE — Telephone Encounter (Signed)
Patient notified that RX have been done and she will need to pick up free coupon for meter.

## 2011-12-10 NOTE — Assessment & Plan Note (Signed)
Not currently on any medications. She says that she can know when she is getting sick. I asked her to please call us or call her psychiatrist if she needs help with this

## 2011-12-10 NOTE — Assessment & Plan Note (Addendum)
New diagnosis of diabetes. Restart metformin and started glipizide. Order diabetes education. We'll follow her in 2 weeks. I have asked her to see Dr. Raymondo Band for help with smoking cessation increased risk reduction.

## 2012-01-11 ENCOUNTER — Ambulatory Visit: Payer: Medicaid Other | Admitting: *Deleted

## 2012-06-12 ENCOUNTER — Encounter: Payer: Self-pay | Admitting: Home Health Services

## 2012-06-13 ENCOUNTER — Encounter: Payer: Self-pay | Admitting: Home Health Services

## 2012-10-04 ENCOUNTER — Telehealth: Payer: Self-pay | Admitting: *Deleted

## 2012-10-04 NOTE — Telephone Encounter (Signed)
Call made to schedule A1C check-overdue. Pt states has no insurance and medicaid expired. Encouraged pt to reapply for medicaid, if unsuccessful, apply for orange card.

## 2012-10-13 ENCOUNTER — Emergency Department (HOSPITAL_COMMUNITY)
Admission: EM | Admit: 2012-10-13 | Discharge: 2012-10-14 | Disposition: A | Discharge status: Home / Self Care | Payer: Medicaid Other | Attending: Emergency Medicine | Admitting: Emergency Medicine

## 2012-10-13 ENCOUNTER — Encounter (HOSPITAL_COMMUNITY): Payer: Self-pay | Admitting: Emergency Medicine

## 2012-10-13 DIAGNOSIS — E669 Obesity, unspecified: Secondary | ICD-10-CM | POA: Insufficient documentation

## 2012-10-13 DIAGNOSIS — S139XXA Sprain of joints and ligaments of unspecified parts of neck, initial encounter: Secondary | ICD-10-CM | POA: Insufficient documentation

## 2012-10-13 DIAGNOSIS — Z8632 Personal history of gestational diabetes: Secondary | ICD-10-CM | POA: Insufficient documentation

## 2012-10-13 DIAGNOSIS — S161XXA Strain of muscle, fascia and tendon at neck level, initial encounter: Secondary | ICD-10-CM

## 2012-10-13 DIAGNOSIS — S39012A Strain of muscle, fascia and tendon of lower back, initial encounter: Secondary | ICD-10-CM

## 2012-10-13 DIAGNOSIS — M549 Dorsalgia, unspecified: Secondary | ICD-10-CM

## 2012-10-13 DIAGNOSIS — F17200 Nicotine dependence, unspecified, uncomplicated: Secondary | ICD-10-CM | POA: Insufficient documentation

## 2012-10-13 DIAGNOSIS — Y92410 Unspecified street and highway as the place of occurrence of the external cause: Secondary | ICD-10-CM | POA: Insufficient documentation

## 2012-10-13 DIAGNOSIS — Y9389 Activity, other specified: Secondary | ICD-10-CM | POA: Insufficient documentation

## 2012-10-13 DIAGNOSIS — S335XXA Sprain of ligaments of lumbar spine, initial encounter: Secondary | ICD-10-CM | POA: Insufficient documentation

## 2012-10-13 DIAGNOSIS — M62830 Muscle spasm of back: Secondary | ICD-10-CM

## 2012-10-13 DIAGNOSIS — Z8659 Personal history of other mental and behavioral disorders: Secondary | ICD-10-CM | POA: Insufficient documentation

## 2012-10-13 DIAGNOSIS — Z8709 Personal history of other diseases of the respiratory system: Secondary | ICD-10-CM | POA: Insufficient documentation

## 2012-10-13 DIAGNOSIS — Z872 Personal history of diseases of the skin and subcutaneous tissue: Secondary | ICD-10-CM | POA: Insufficient documentation

## 2012-10-13 MED ORDER — NAPROXEN 500 MG PO TABS: 500.0000 mg | ORAL_TABLET | Freq: Two times a day (BID) | ORAL | Status: DC | PRN

## 2012-10-13 MED ORDER — METHOCARBAMOL 750 MG PO TABS: 750.0000 mg | ORAL_TABLET | Freq: Four times a day (QID) | ORAL | Status: DC | PRN
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
## 2012-10-14 NOTE — ED Provider Notes (Signed)
Medical screening examination/treatment/procedure(s) were performed by non-physician practitioner and as supervising physician I was immediately available for consultation/collaboration.  [name], MD 10/14/12 [phone]

## 2012-10-18 ENCOUNTER — Encounter: Payer: Self-pay | Admitting: *Deleted

## 2012-10-24 IMAGING — US US PELV - US TRANSVAGINAL
1 series · 13 of 25 positions shown · non-contrast
Comparison: None.

CLINICAL DATA: Pelvic pain.  Last menses missed.  History of
ovarian cyst.  LMP 08/03/2010



[Series 1: us pelvis complete + us transvaginal non-ob · 13 of 83 slices shown]
[im 1/83]
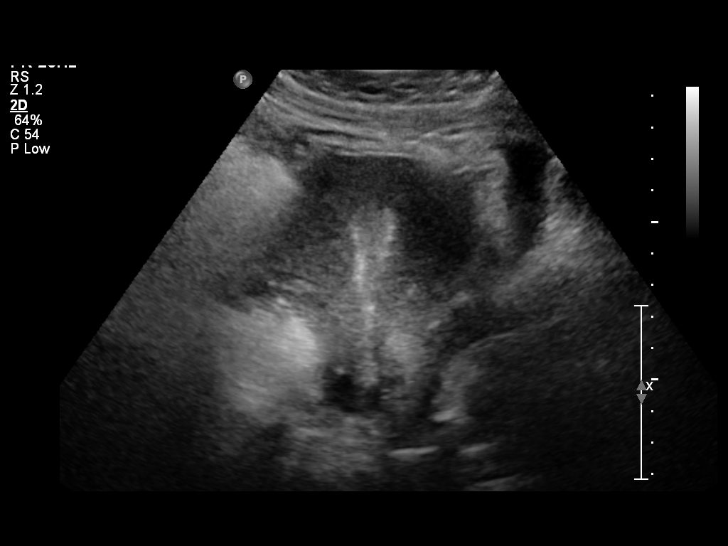
[im 7/83]
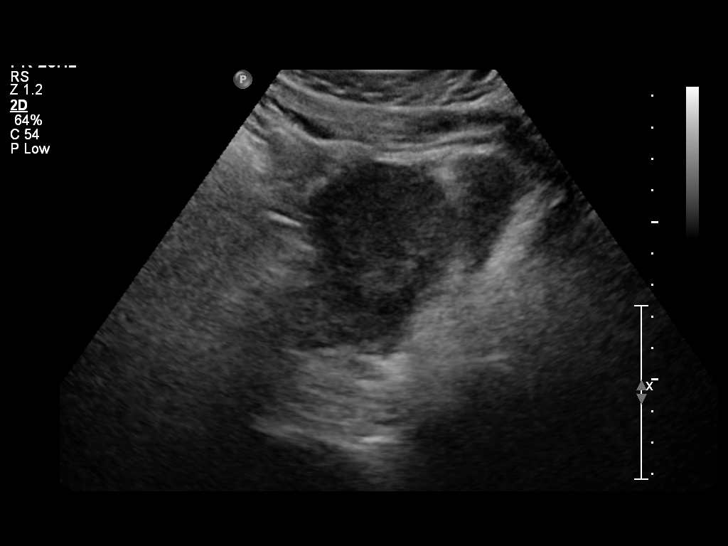
[im 14/83]
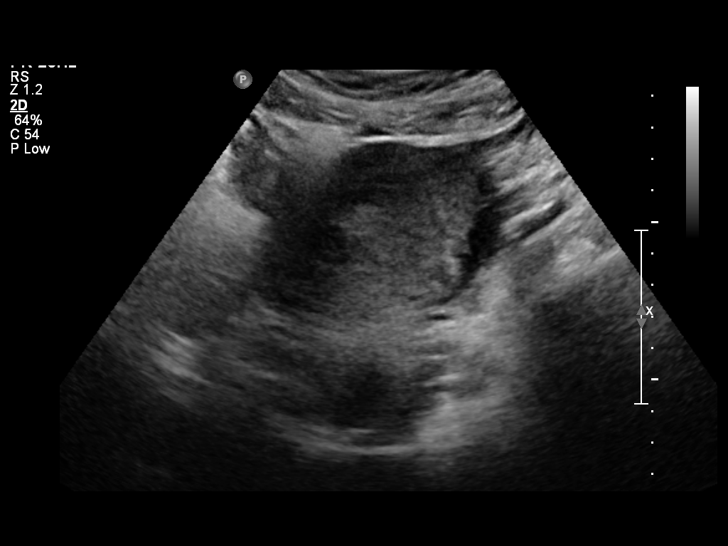
[im 21/83]
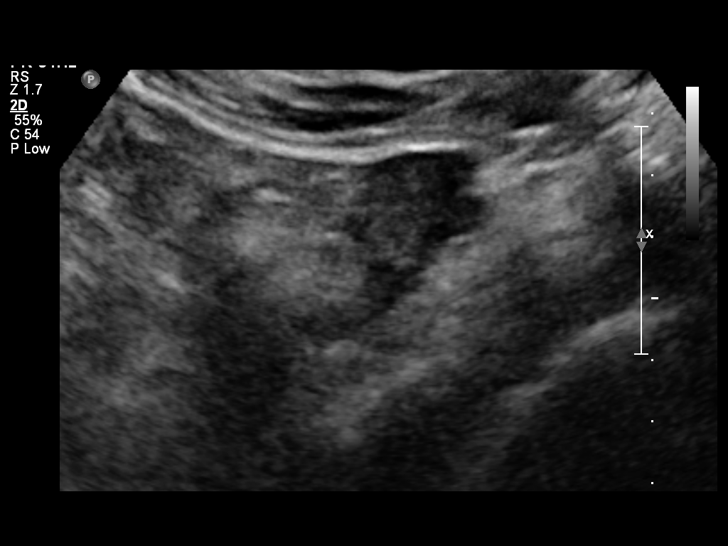
[im 28/83]
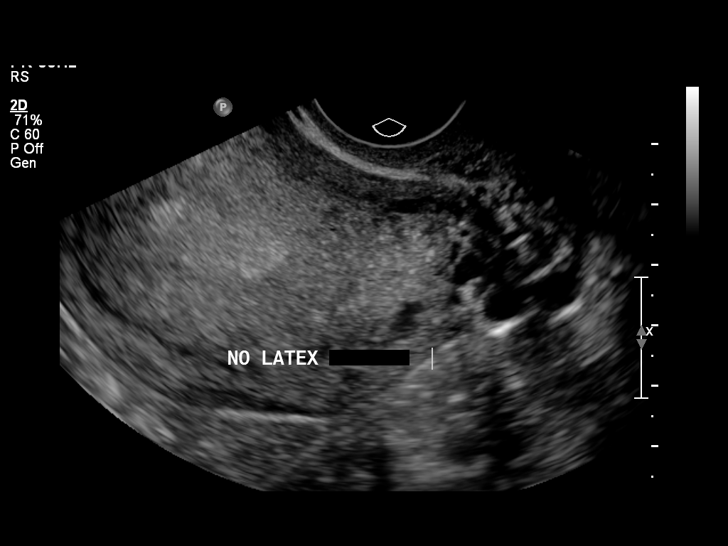
[im 35/83]
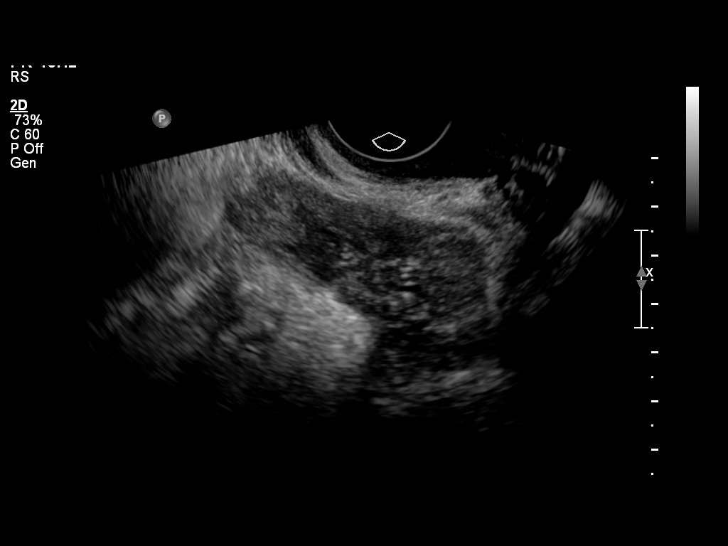
[im 42/83]
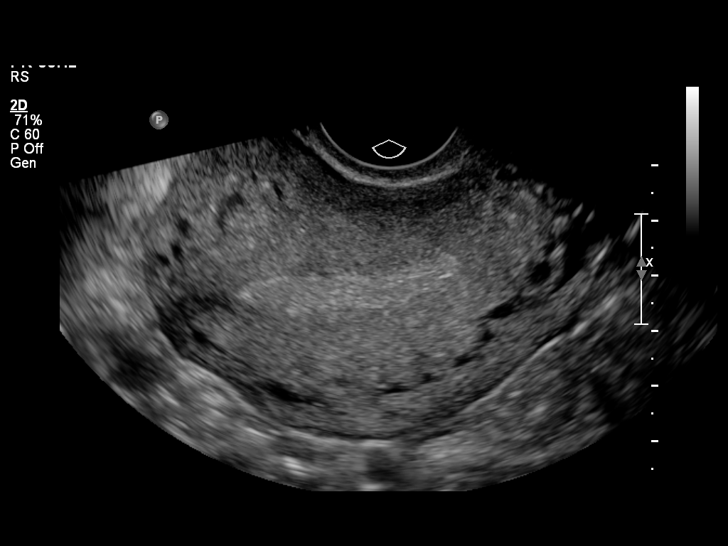
[im 48/83]
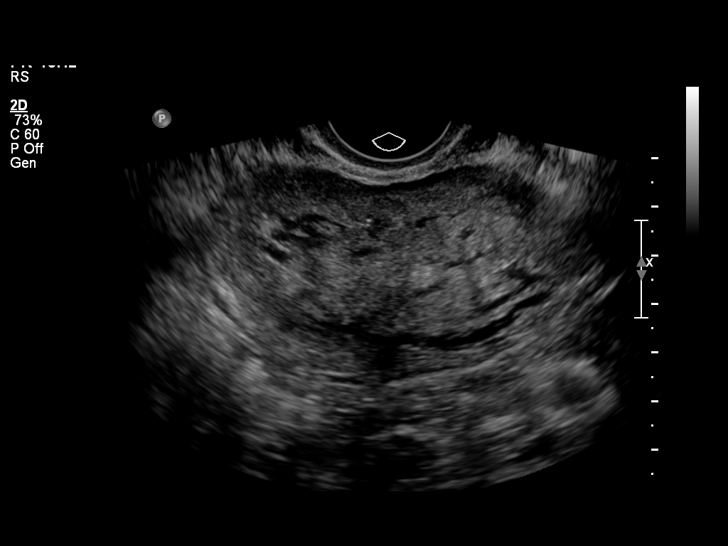
[im 55/83]
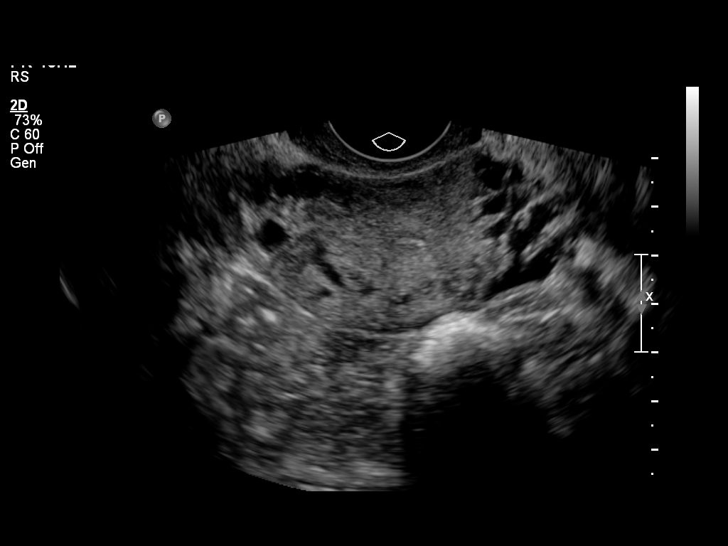
[im 62/83]
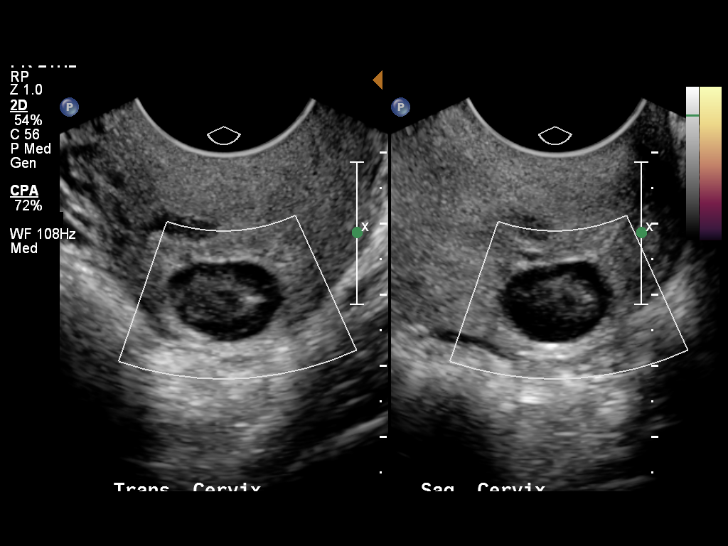
[im 69/83]
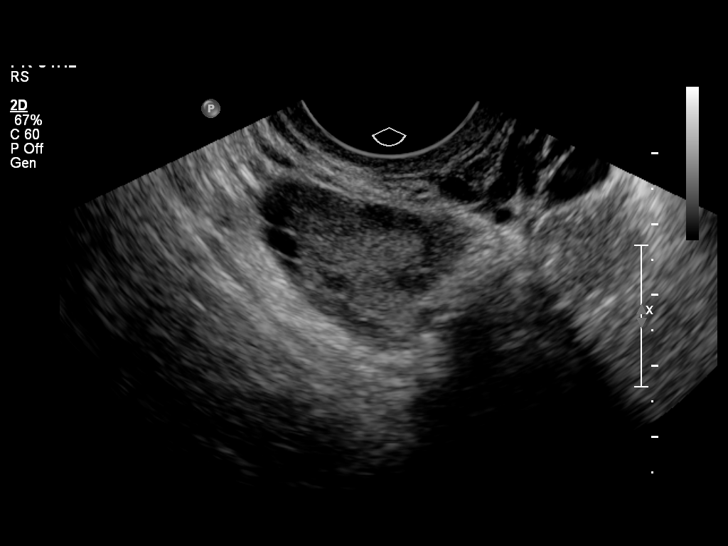
[im 76/83]
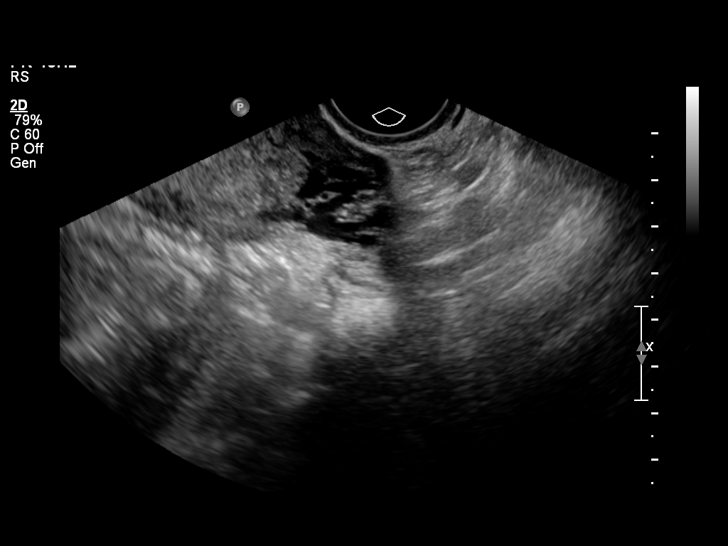
[im 83/83]
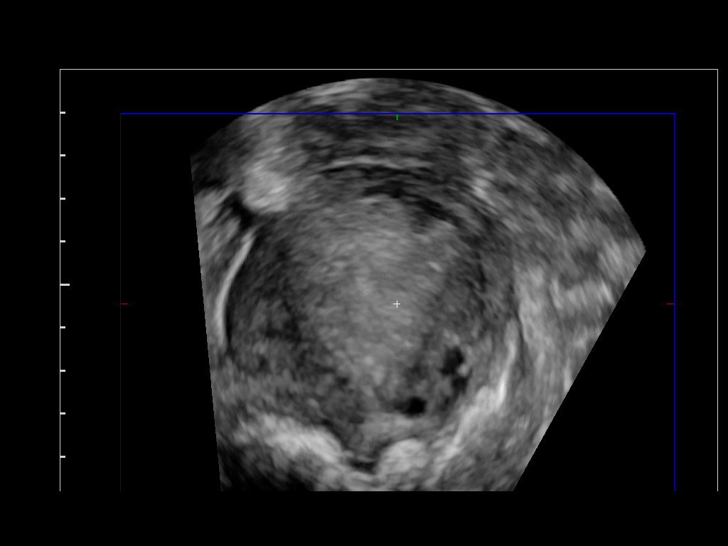

[13 of 25 positions shown; findings below may reference images not displayed]

FINDINGS: Uterus measures 9.4 cm sagittally, 5.3 cm in depth and 8.8 cm in
transverse width.  A small focal fibroid is identified in the
posterior fundal region measuring 0.9 x 0.8 x 0.7 cm and this
demonstrates a small submucosal component.  The remainder of the
myometrium is homogeneous.  A complex Nabothian cyst is seen within
the cervix measuring 1.7 x 1.5 x 1.1 cm and contains diffuse low
level echoes compatible with some proteinaceous debris.

Endometrium measures 11 mm in thickness.  The lining is
homogeneously echogenic with no areas of focal thickening or
heterogeneity and this would suggest a presecretory phase of the
cycle.

Right ovary measures measures 2.5 x 1.8 x 2.5 cm.  Normal
appearance.

Left ovary measures measures 2.6 x 1.4 x 2.0 cm.  Normal
appearance.

Other Findings:  No pelvic fluid or separate adnexal masses are
seen.
IMPRESSION: Small focal fibroid with a partial submucosal component.Otherwise
unremarkable pelvic ultrasound with normal endometrial lining and
ovaries.

## 2013-02-05 ENCOUNTER — Encounter (HOSPITAL_COMMUNITY): Payer: Self-pay | Admitting: Emergency Medicine

## 2013-02-05 ENCOUNTER — Emergency Department (HOSPITAL_COMMUNITY)
Admission: EM | Admit: 2013-02-05 | Discharge: 2013-02-05 | Discharge status: Against Medical Advice | Payer: Medicaid Other | Source: Home / Self Care | Attending: Emergency Medicine | Admitting: Emergency Medicine

## 2013-02-05 ENCOUNTER — Inpatient Hospital Stay (HOSPITAL_COMMUNITY): Admission: AD | Admit: 2013-02-05 | Payer: Medicaid Other | Source: Intra-hospital | Admitting: Psychiatry

## 2013-02-05 ENCOUNTER — Emergency Department (HOSPITAL_COMMUNITY)
Admission: EM | Admit: 2013-02-05 | Discharge: 2013-02-05 | Disposition: A | Discharge status: Home / Self Care | Payer: Medicaid Other | Attending: Emergency Medicine | Admitting: Emergency Medicine

## 2013-02-05 DIAGNOSIS — L732 Hidradenitis suppurativa: Secondary | ICD-10-CM | POA: Insufficient documentation

## 2013-02-05 DIAGNOSIS — Z79899 Other long term (current) drug therapy: Secondary | ICD-10-CM | POA: Insufficient documentation

## 2013-02-05 DIAGNOSIS — F319 Bipolar disorder, unspecified: Secondary | ICD-10-CM | POA: Insufficient documentation

## 2013-02-05 DIAGNOSIS — F17200 Nicotine dependence, unspecified, uncomplicated: Secondary | ICD-10-CM | POA: Insufficient documentation

## 2013-02-05 DIAGNOSIS — Z8632 Personal history of gestational diabetes: Secondary | ICD-10-CM | POA: Insufficient documentation

## 2013-02-05 DIAGNOSIS — E669 Obesity, unspecified: Secondary | ICD-10-CM | POA: Insufficient documentation

## 2013-02-05 DIAGNOSIS — F329 Major depressive disorder, single episode, unspecified: Secondary | ICD-10-CM | POA: Insufficient documentation

## 2013-02-05 DIAGNOSIS — F3289 Other specified depressive episodes: Secondary | ICD-10-CM | POA: Insufficient documentation

## 2013-02-05 DIAGNOSIS — J40 Bronchitis, not specified as acute or chronic: Secondary | ICD-10-CM | POA: Insufficient documentation

## 2013-02-05 LAB — BASIC METABOLIC PANEL
BUN: 12 mg/dL (ref 6–23)
CO2: 24 mEq/L (ref 19–32)
Calcium: 9.2 mg/dL (ref 8.4–10.5)
Chloride: 101 mEq/L (ref 96–112)
Creatinine, Ser: 0.64 mg/dL (ref 0.50–1.10)
GFR calc Af Amer: >90 mL/min (ref >90)
GFR calc non Af Amer: >90 mL/min (ref >90)
Glucose, Bld: 215 mg/dL — ABNORMAL HIGH (ref 70–99)
Potassium: 4.3 mEq/L (ref 3.5–5.1)
Sodium: 134 mEq/L — ABNORMAL LOW (ref 135–145)

## 2013-02-05 LAB — CBC
HCT: 39 % (ref 36.0–46.0)
Hemoglobin: 14.2 g/dL (ref 12.0–15.0)
MCH: 28.3 pg (ref 26.0–34.0)
MCHC: 36.4 g/dL — ABNORMAL HIGH (ref 30.0–36.0)
MCV: 77.7 fL — ABNORMAL LOW (ref 78.0–100.0)
Platelets: 348 10*3/uL (ref 150–400)
RBC: 5.02 MIL/uL (ref 3.87–5.11)
RDW: 14.5 % (ref 11.5–15.5)
WBC: 9.5 10*3/uL (ref 4.0–10.5)

## 2013-02-05 LAB — RAPID URINE DRUG SCREEN, HOSP PERFORMED
Amphetamines: NOT DETECTED
Barbiturates: NOT DETECTED
Benzodiazepines: NOT DETECTED
Cocaine: NOT DETECTED
Opiates: NOT DETECTED
Tetrahydrocannabinol: NOT DETECTED

## 2013-02-05 LAB — ETHANOL: Alcohol, Ethyl (B): <11 mg/dL (ref 0–11)

## 2013-02-05 MED ORDER — LORAZEPAM 1 MG PO TABS: 1.0000 mg | ORAL_TABLET | Freq: Three times a day (TID) | ORAL | Status: DC | PRN

## 2013-02-05 MED ORDER — ZOLPIDEM TARTRATE 5 MG PO TABS: 5.0000 mg | ORAL_TABLET | Freq: Every evening | ORAL | Status: DC | PRN

## 2013-02-05 MED ORDER — NICOTINE 21 MG/24HR TD PT24
21.0000 mg | MEDICATED_PATCH | Freq: Once | TRANSDERMAL | Status: DC
  Administered 2013-02-05: 21 mg via TRANSDERMAL
  Filled 2013-02-05: qty 1

## 2013-02-05 MED ORDER — IBUPROFEN 200 MG PO TABS: 600.0000 mg | ORAL_TABLET | Freq: Three times a day (TID) | ORAL | Status: DC | PRN

## 2013-02-05 MED ORDER — ACETAMINOPHEN 325 MG PO TABS: 650.0000 mg | ORAL_TABLET | ORAL | Status: DC | PRN

## 2013-02-05 MED ORDER — ALUM & MAG HYDROXIDE-SIMETH 200-200-20 MG/5ML PO SUSP: 30.0000 mL | ORAL | Status: DC | PRN

## 2013-02-05 MED ORDER — ONDANSETRON HCL 4 MG PO TABS: 4.0000 mg | ORAL_TABLET | Freq: Three times a day (TID) | ORAL | Status: DC | PRN

## 2013-02-05 NOTE — ED Notes (Signed)
Pt wanted to smoke a cigarette, refusing to change into blue scrubs.  Pt advised that if she stepped outside she will need to check back in.

## 2013-02-05 NOTE — ED Notes (Signed)
Per pt, states she has a history of bipolar, states manic last 3 weeks now she feels depressed

## 2013-02-05 NOTE — ED Provider Notes (Signed)
Medical screening examination/treatment/procedure(s) were performed by non-physician practitioner and as supervising physician I was immediately available for consultation/collaboration.  Shequita Peplinski M Sukhmani Fetherolf, MD 02/05/13 1640 

## 2013-02-05 NOTE — ED Notes (Signed)
Patient states she is here to be put on bipolar medications. States she has a lot of stressors in her life, she is a single mother and is hard on her
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
## 2013-02-05 NOTE — ED Provider Notes (Signed)
Patient no longer once this day in the SI daily. Patient's no longer suicidal or homicidal. Patient has mental health followup all are ready arranged for her diagnosis of bipolar disorder. She'll followup as an outpatient. Patient will be discharged.     Shelda Jakes, MD 02/05/13 2149

## 2013-02-05 NOTE — ED Provider Notes (Signed)
History    CSN: [id number] Arrival date & time 02/05/13  1120  First MD Initiated Contact with Patient 02/05/13 1153     Chief Complaint  Patient presents with  . Medical Clearance   (Consider location/radiation/quality/duration/timing/severity/associated sxs/prior Treatment) HPI Patient presents emergency department for traction, and bipolar disorder.  Patient, states, that she is aware former self, but she got hurt while harming others, she would not mind, it.  The patient, states, that she has several people she would like to harm, "right now."  Patient denies chest pain, shortness of breath, nausea, vomiting, diarrhea, abdominal pain, blurred vision, hallucinations, headache, neck pain, back pain, dizziness, or syncope.  Patient, states, that nothing seems to make her condition, better patient, states she would like to be back on her medication. Past Medical History  Diagnosis Date  . Hidradenitis suppurativa   . Depressive disorder, not elsewhere classified   . Tobacco abuse   . Bipolar 1 disorder   . Obesity   . Gestational diabetes   . Bronchitis    Past Surgical History  Procedure Laterality Date  . Tubal ligation     Family History  Problem Relation Age of Onset  . Hypertension Mother   . Schizophrenia Mother   . Hypertension Father    History  Substance Use Topics  . Smoking status: Current Every Day Smoker -- 0.50 packs/day    Types: Cigarettes  . Smokeless tobacco: Not on file  . Alcohol Use: No   OB History   Grav Para Term Preterm Abortions TAB SAB Ect Mult Living                 Review of Systems All other systems negative except as documented in the HPI. All pertinent positives and negatives as reviewed in the HPI. Allergies  Review of patient's allergies indicates no known allergies.  Home Medications   Current Outpatient Rx  Name  Route  Sig  Dispense  Refill  . albuterol (PROVENTIL HFA;VENTOLIN HFA) 108 (90 BASE) MCG/ACT inhaler    Inhalation   Inhale 2 puffs into the lungs every 6 (six) hours as needed for wheezing or shortness of breath.         Marland Kitchen ibuprofen (ADVIL,MOTRIN) 200 MG tablet   Oral   Take 400 mg by mouth every 8 (eight) hours as needed for pain.         . Melatonin 5 MG CAPS   Oral   Take 1 capsule by mouth at bedtime.         Marland Kitchen OVER THE COUNTER MEDICATION   Oral   Take 1 tablet by mouth 2 (two) times daily. OTC Stress Formula vitamin.         . vitamin B-12 (CYANOCOBALAMIN) 1000 MCG tablet   Oral   Take 1,000 mcg by mouth daily.          LMP 02/05/2013 Physical Exam  Nursing note and vitals reviewed. Constitutional: She is oriented to person, place, and time. She appears well-developed and well-nourished.  HENT:  Head: Normocephalic and atraumatic.  Mouth/Throat: Oropharynx is clear and moist.  Eyes: Pupils are equal, round, and reactive to light.  Neck: Normal range of motion. Neck supple.  Cardiovascular: Normal rate, regular rhythm and normal heart sounds.  Exam reveals no gallop and no friction rub.   No murmur heard. Pulmonary/Chest: Effort normal and breath sounds normal.  Neurological: She is alert and oriented to person, place, and time. She exhibits normal muscle tone. Coordination  normal.  Skin: Skin is warm and dry. No rash noted.  Psychiatric: Her affect is angry and blunt. Her speech is rapid and/or pressured. She is agitated.    ED Course  Procedures (including critical care time) Labs Reviewed  URINE RAPID DRUG SCREEN (HOSP PERFORMED)    Patient will need act team assessment.  Patient is advised of the plan and voices an understanding MDM    Carlyle Dolly, PA-C 02/05/13 1340

## 2013-02-05 NOTE — ED Notes (Addendum)
Pt is wanting to go outside to smoke again.  Pt explained to pt that she cannot leave the ED while she is a pt in the ED d/t liability.  Pt states that she is going outside to smoke, she has asked for a cigarette patch when she checked back in the second time but still waiting for it.  Pt made aware that this nurse needs an order for it and still needs to see the provider.  Pt is telling her family member that "we" (staff) are being difficult and is just wanting to make "[place]" get mad.  After giving her family member keys to her house, pt decided that she is not going outside to smoke.

## 2013-02-08 ENCOUNTER — Other Ambulatory Visit: Payer: Self-pay

## 2013-03-12 ENCOUNTER — Encounter: Payer: Self-pay | Admitting: Family Medicine

## 2013-03-12 ENCOUNTER — Ambulatory Visit (INDEPENDENT_AMBULATORY_CARE_PROVIDER_SITE_OTHER): Payer: Medicaid Other | Admitting: Family Medicine

## 2013-03-12 ENCOUNTER — Other Ambulatory Visit (HOSPITAL_COMMUNITY)
Admission: RE | Admit: 2013-03-12 | Discharge: 2013-03-12 | Disposition: A | Discharge status: Home / Self Care | Payer: Medicaid Other | Source: Ambulatory Visit | Attending: Family Medicine | Admitting: Family Medicine

## 2013-03-12 VITALS — BP 96/62 | HR 71 | Temp 98.6°F | Ht 63.0 in | Wt 182.0 lb

## 2013-03-12 DIAGNOSIS — F633 Trichotillomania: Secondary | ICD-10-CM

## 2013-03-12 DIAGNOSIS — Z124 Encounter for screening for malignant neoplasm of cervix: Secondary | ICD-10-CM

## 2013-03-12 DIAGNOSIS — F6389 Other impulse disorders: Secondary | ICD-10-CM

## 2013-03-12 DIAGNOSIS — J45909 Unspecified asthma, uncomplicated: Secondary | ICD-10-CM

## 2013-03-12 DIAGNOSIS — Z1151 Encounter for screening for human papillomavirus (HPV): Secondary | ICD-10-CM | POA: Insufficient documentation

## 2013-03-12 DIAGNOSIS — E785 Hyperlipidemia, unspecified: Secondary | ICD-10-CM

## 2013-03-12 DIAGNOSIS — N898 Other specified noninflammatory disorders of vagina: Secondary | ICD-10-CM

## 2013-03-12 DIAGNOSIS — F17200 Nicotine dependence, unspecified, uncomplicated: Secondary | ICD-10-CM

## 2013-03-12 DIAGNOSIS — F3162 Bipolar disorder, current episode mixed, moderate: Secondary | ICD-10-CM

## 2013-03-12 DIAGNOSIS — J4530 Mild persistent asthma, uncomplicated: Secondary | ICD-10-CM

## 2013-03-12 DIAGNOSIS — Z01419 Encounter for gynecological examination (general) (routine) without abnormal findings: Secondary | ICD-10-CM | POA: Insufficient documentation

## 2013-03-12 DIAGNOSIS — Z113 Encounter for screening for infections with a predominantly sexual mode of transmission: Secondary | ICD-10-CM | POA: Insufficient documentation

## 2013-03-12 DIAGNOSIS — E1165 Type 2 diabetes mellitus with hyperglycemia: Secondary | ICD-10-CM

## 2013-03-12 LAB — LIPID PANEL
HDL: 67 mg/dL (ref >39)
LDL Cholesterol: 117 mg/dL — ABNORMAL HIGH (ref 0–99)
Total CHOL/HDL Ratio: 3 Ratio
Triglycerides: 95 mg/dL (ref <150)
VLDL: 19 mg/dL (ref 0–40)

## 2013-03-12 LAB — COMPREHENSIVE METABOLIC PANEL
ALT: 10 U/L (ref 0–35)
AST: 11 U/L (ref 0–37)
Alkaline Phosphatase: 49 U/L (ref 39–117)
BUN: 10 mg/dL (ref 6–23)
Chloride: 106 mEq/L (ref 96–112)
Creat: 0.78 mg/dL (ref 0.50–1.10)
Total Bilirubin: 0.4 mg/dL (ref 0.3–1.2)

## 2013-03-12 LAB — POCT GLYCOSYLATED HEMOGLOBIN (HGB A1C): Hemoglobin A1C: 7.8

## 2013-03-12 LAB — POCT WET PREP (WET MOUNT): Clue Cells Wet Prep Whiff POC: POSITIVE

## 2013-03-12 MED ORDER — ALBUTEROL SULFATE HFA 108 (90 BASE) MCG/ACT IN AERS: 2.0000 | INHALATION_SPRAY | Freq: Four times a day (QID) | RESPIRATORY_TRACT | Status: DC | PRN

## 2013-03-12 MED ORDER — METFORMIN HCL 500 MG PO TABS: 500.0000 mg | ORAL_TABLET | Freq: Two times a day (BID) | ORAL | Status: DC

## 2013-03-12 MED ORDER — GLIPIZIDE 5 MG PO TABS: 5.0000 mg | ORAL_TABLET | Freq: Two times a day (BID) | ORAL | Status: DC

## 2013-03-12 MED ORDER — SPACER/AERO-HOLDING CHAMBERS KIT: PACK | Status: DC

## 2013-03-12 NOTE — Assessment & Plan Note (Signed)
Wanting to quit Resources for [facility] QUitline given Discussed E-cig use and need to replace 20mg  nicotine per pack of cig

## 2013-03-12 NOTE — Patient Instructions (Addendum)
You are doing well Please continue to take your medicines prescribed by Dr. Midge Aver Please come back to see me in 3 months Please restart your metformin (at the reduced dose) and glipizide Please use the  Spacer tube with yoru albuterol Please call with any further concerns.

## 2013-03-12 NOTE — Assessment & Plan Note (Signed)
No exacerbation today Albuterol pump sent Counseled on use w/ spacer Spacer Rx sent

## 2013-03-12 NOTE — Assessment & Plan Note (Signed)
A1c 7.8 tyofay Not taking metformin and glipizide GI intolerance to 2000mg  metformin bid Restart glipizide at 5mg  BID and metformin 500 mg BID

## 2013-03-12 NOTE — Assessment & Plan Note (Signed)
F/u Dr Midge Aver PRN

## 2013-03-12 NOTE — Assessment & Plan Note (Addendum)
Cont prava Fasting lipid panel and CMET

## 2013-03-12 NOTE — Assessment & Plan Note (Addendum)
Cont current therapy as directed by Dr. Midge Aver

## 2013-03-12 NOTE — Progress Notes (Signed)
[name] is a 37 y.o. female who presents to [facility] today for routine physical  Depression and bipolar: going to Dr Midge Aver who just started pt on escitalopram and lamotrigine. Well controlled per pt. Denies HI/SI  PAP: not sure when last one done 2010. Unprotected intercourse last week. Denies vaginal discharge bleeding, abdominal pain.   DM: Does not check sugars at home b/c makes nervous. Not taking glipizide and metformin as not sure which was causing diarrhea. Prescribed metformin 2000mg  BID and 5mg  glipizide BID. Denies HA, tremors, syncope. One episode of waxing and waning vision for a few hours.  Asthma: uses albuterol QOD. Pump not helping. No spacer. Uses goddaughters nebulizer w/ significant relief. Triggers includes smoking.   Insomnia: stopped melatonin.   Stress: using Zinc supplement.   Tobacco: 1ppd. Wants to quit.    The following portions of the patient's history were reviewed and updated as appropriate: allergies, current medications, past medical history, family and social history, and problem list.  Patient is a 1ppd smoker  Past Medical History  Diagnosis Date  . Hidradenitis suppurativa   . Depressive disorder, not elsewhere classified   . Tobacco abuse   . Bipolar 1 disorder   . Obesity   . Gestational diabetes   . Bronchitis     ROS as above otherwise neg.    Medications reviewed. Current Outpatient Prescriptions  Medication Sig Dispense Refill  . albuterol (PROVENTIL HFA;VENTOLIN HFA) 108 (90 BASE) MCG/ACT inhaler Inhale 2 puffs into the lungs every 6 (six) hours as needed for wheezing or shortness of breath.      . escitalopram (LEXAPRO) 10 MG tablet Take 10 mg by mouth daily.      Marland Kitchen ibuprofen (ADVIL,MOTRIN) 200 MG tablet Take 400 mg by mouth every 8 (eight) hours as needed for pain.      Marland Kitchen lamoTRIgine (LAMICTAL) 25 MG tablet Take 25 mg by mouth daily.      . vitamin B-12 (CYANOCOBALAMIN) 1000 MCG tablet Take 1,000 mcg by mouth daily.      .  Melatonin 5 MG CAPS Take 1 capsule by mouth at bedtime.      Marland Kitchen OVER THE COUNTER MEDICATION Take 1 tablet by mouth 2 (two) times daily. OTC Stress Formula vitamin.      . pravastatin (PRAVACHOL) 20 MG tablet Take 20 mg by mouth daily.       No current facility-administered medications for this visit.    Exam: BP 96/62  Pulse 71  Temp(Src) 98.6 F (37 C) (Oral)  Ht 5\' 3"  (1.6 m)  Wt 182 lb (82.555 kg)  BMI 32.25 kg/m2 Gen: Well NAD HEENT: EOMI,  MMM Lungs: CTABL Nl WOB Heart: RRR no MRG Abd: NABS, NT, ND Exts: Non edematous BL  LE, warm and well perfused.  GU: vaginal wall well rugated and moist. Some old blood. Cervix closed w/o lesions  Results for orders placed in visit on 03/12/13 (from the past 72 hour(s))  POCT GLYCOSYLATED HEMOGLOBIN (HGB A1C)     Status: None   Collection Time    03/12/13 11:20 AM      Result Value Range   Hemoglobin A1C 7.8

## 2013-10-06 ENCOUNTER — Emergency Department (HOSPITAL_COMMUNITY)
Admission: EM | Admit: 2013-10-06 | Discharge: 2013-10-06 | Disposition: A | Discharge status: Home / Self Care | Payer: Medicaid Other | Source: Home / Self Care

## 2013-10-06 ENCOUNTER — Other Ambulatory Visit (HOSPITAL_COMMUNITY)
Admission: RE | Admit: 2013-10-06 | Discharge: 2013-10-06 | Disposition: A | Discharge status: Home / Self Care | Payer: Medicaid Other | Source: Ambulatory Visit | Attending: Family Medicine | Admitting: Family Medicine

## 2013-10-06 ENCOUNTER — Encounter (HOSPITAL_COMMUNITY): Payer: Self-pay | Admitting: Emergency Medicine

## 2013-10-06 DIAGNOSIS — E119 Type 2 diabetes mellitus without complications: Secondary | ICD-10-CM

## 2013-10-06 DIAGNOSIS — N72 Inflammatory disease of cervix uteri: Secondary | ICD-10-CM

## 2013-10-06 DIAGNOSIS — N730 Acute parametritis and pelvic cellulitis: Secondary | ICD-10-CM

## 2013-10-06 DIAGNOSIS — L0390 Cellulitis, unspecified: Secondary | ICD-10-CM

## 2013-10-06 DIAGNOSIS — N898 Other specified noninflammatory disorders of vagina: Secondary | ICD-10-CM

## 2013-10-06 DIAGNOSIS — L0291 Cutaneous abscess, unspecified: Secondary | ICD-10-CM

## 2013-10-06 DIAGNOSIS — Z113 Encounter for screening for infections with a predominantly sexual mode of transmission: Secondary | ICD-10-CM | POA: Insufficient documentation

## 2013-10-06 DIAGNOSIS — N760 Acute vaginitis: Secondary | ICD-10-CM | POA: Insufficient documentation

## 2013-10-06 LAB — POCT URINALYSIS DIP (DEVICE)
BILIRUBIN URINE: NEGATIVE
GLUCOSE, UA: NEGATIVE mg/dL
Hgb urine dipstick: NEGATIVE
Ketones, ur: NEGATIVE mg/dL
Leukocytes, UA: NEGATIVE
NITRITE: NEGATIVE
PH: 6 (ref 5.0–8.0)
Protein, ur: NEGATIVE mg/dL
Specific Gravity, Urine: 1.025 (ref 1.005–1.030)
Urobilinogen, UA: 0.2 mg/dL (ref 0.0–1.0)

## 2013-10-06 LAB — POCT PREGNANCY, URINE: Preg Test, Ur: NEGATIVE

## 2013-10-06 LAB — GLUCOSE, CAPILLARY: Glucose-Capillary: 196 mg/dL — ABNORMAL HIGH (ref 70–99)

## 2013-10-06 MED ORDER — ONDANSETRON 4 MG PO TBDP
4.0000 mg | ORAL_TABLET | Freq: Once | ORAL | Status: AC
  Administered 2013-10-06: 4 mg via ORAL

## 2013-10-06 MED ORDER — AZITHROMYCIN 250 MG PO TABS: ORAL_TABLET | ORAL | Status: DC

## 2013-10-06 MED ORDER — CEPHALEXIN 250 MG PO CAPS: 250.0000 mg | ORAL_CAPSULE | Freq: Four times a day (QID) | ORAL | Status: DC

## 2013-10-06 MED ORDER — METRONIDAZOLE 500 MG PO TABS: 500.0000 mg | ORAL_TABLET | Freq: Two times a day (BID) | ORAL | Status: DC

## 2013-10-06 MED ORDER — CEFTRIAXONE SODIUM 1 G IJ SOLR
1.0000 g | Freq: Once | INTRAMUSCULAR | Status: AC
  Administered 2013-10-06: 1 g via INTRAMUSCULAR

## 2013-10-06 MED ORDER — CEFTRIAXONE SODIUM 1 G IJ SOLR
INTRAMUSCULAR | Status: AC
  Filled 2013-10-06: qty 10

## 2013-10-06 MED ORDER — ONDANSETRON 4 MG PO TBDP
ORAL_TABLET | ORAL | Status: AC
  Filled 2013-10-06: qty 1

## 2013-10-06 MED ORDER — LIDOCAINE HCL (PF) 1 % IJ SOLN
INTRAMUSCULAR | Status: AC
  Filled 2013-10-06: qty 5

## 2013-10-06 NOTE — ED Notes (Signed)
Patient is aware of post injection delay prior to discharge.

## 2013-10-06 NOTE — ED Provider Notes (Signed)
CSN: [id number]     Arrival date & time 10/06/13  1004 History   First MD Initiated Contact with Patient 10/06/13 1113     Chief Complaint  Patient presents with  . Vaginal Discharge   (Consider location/radiation/quality/duration/timing/severity/associated sxs/prior Treatment) HPI Comments: 38 year old female complaining of periumbilical tenderness for 3 days. Last night she saw a thick tan colored fluid exuding from the umbilicus. Second complaint is that of a vaginal discharge. Her female sexual partner recently released from prison has been discovered to be having sex with other females. She is concerned about STDs. Has a history of diabetes and was treated with metformin and Glucotrol. She stopped taking metformin 6 months ago due to GI side effects. She also refuses to check her blood sugars because it makes her nervous.   Past Medical History  Diagnosis Date  . Hidradenitis suppurativa   . Depressive disorder, not elsewhere classified   . Tobacco abuse   . Bipolar 1 disorder   . Obesity   . Gestational diabetes   . Bronchitis    Past Surgical History  Procedure Laterality Date  . Tubal ligation     Family History  Problem Relation Age of Onset  . Hypertension Mother   . Schizophrenia Mother   . Hypertension Father   . Diabetes Father    History  Substance Use Topics  . Smoking status: Current Every Day Smoker -- 1.00 packs/day    Types: Cigarettes  . Smokeless tobacco: Not on file  . Alcohol Use: Yes   OB History   Grav Para Term Preterm Abortions TAB SAB Ect Mult Living                 Review of Systems  Constitutional: Negative.   HENT: Negative.   Respiratory: Negative.   Cardiovascular: Negative.   Gastrointestinal: Positive for nausea and abdominal pain. Negative for vomiting and blood in stool.  Genitourinary: Positive for vaginal discharge and pelvic pain. Negative for dysuria, flank pain and vaginal bleeding.  Musculoskeletal: Negative.   Skin:  Negative for rash.    Allergies  Review of patient's allergies indicates no known allergies.  Home Medications   Current Outpatient Rx  Name  Route  Sig  Dispense  Refill  . albuterol (PROVENTIL HFA;VENTOLIN HFA) 108 (90 BASE) MCG/ACT inhaler   Inhalation   Inhale 2 puffs into the lungs every 6 (six) hours as needed for wheezing or shortness of breath.   18 g   6   . azithromycin (ZITHROMAX) 250 MG tablet      Take all 4 tabs po stat   4 each   0   . cephALEXin (KEFLEX) 250 MG capsule   Oral   Take 1 capsule (250 mg total) by mouth 4 (four) times daily. Start 10/07/13   28 capsule   0   . escitalopram (LEXAPRO) 10 MG tablet   Oral   Take 10 mg by mouth daily.         Marland Kitchen glipiZIDE (GLUCOTROL) 5 MG tablet   Oral   Take 1 tablet (5 mg total) by mouth 2 (two) times daily before a meal.   60 tablet   3   . ibuprofen (ADVIL,MOTRIN) 200 MG tablet   Oral   Take 400 mg by mouth every 8 (eight) hours as needed for pain.         Marland Kitchen lamoTRIgine (LAMICTAL) 25 MG tablet   Oral   Take 25 mg by mouth daily.         Marland Kitchen  Melatonin 5 MG CAPS   Oral   Take 1 capsule by mouth at bedtime.         . metFORMIN (GLUCOPHAGE) 500 MG tablet   Oral   Take 1 tablet (500 mg total) by mouth 2 (two) times daily with a meal.   180 tablet   3   . metroNIDAZOLE (FLAGYL) 500 MG tablet   Oral   Take 1 tablet (500 mg total) by mouth 2 (two) times daily. X 7 days   14 tablet   0   . OVER THE COUNTER MEDICATION   Oral   Take 1 tablet by mouth 2 (two) times daily. OTC Stress Formula vitamin.         . pravastatin (PRAVACHOL) 20 MG tablet   Oral   Take 20 mg by mouth daily.         Marland Kitchen Spacer/Aero-Holding Chambers KIT      Use spacer as directed with inhaler for every breathing treatment   2 each   1   . vitamin B-12 (CYANOCOBALAMIN) 1000 MCG tablet   Oral   Take 1,000 mcg by mouth daily.          BP 132/91  Pulse 62  Temp(Src) 98.1 F (36.7 C) (Oral)  Resp 17  SpO2  100%  LMP 09/09/2013 Physical Exam  Nursing note and vitals reviewed. Constitutional: She is oriented to person, place, and time. She appears well-developed and well-nourished. No distress.  Eyes: Conjunctivae and EOM are normal.  Neck: Normal range of motion. Neck supple.  Cardiovascular: Normal rate and normal heart sounds.   Pulmonary/Chest: Effort normal and breath sounds normal.  Abdominal: Soft. Bowel sounds are normal. There is no rebound and no guarding.  Is moderate tenderness surrounding the umbilicus. Most of the tenderness is approximately 5 cm inferior. No erythema and no current drainage. No bleeding. No swelling. 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  Genitourinary: Vaginal discharge found.  Normal external female genitalia Vaginal walls and cervical vault is covered with white creamy discharge. Cervix is left of midline and posterior. There is a white fluid exuding from the os. Mild ectocervical erythema. No other lesions seen. Bimanual positive CMT and left adnexal tenderness in which she attributes to an ovarian cyst. No right adnexal tenderness.  Neurological: She is alert and oriented to person, place, and time. She exhibits normal muscle tone.  Skin: Skin is warm and dry.  Psychiatric: She has a normal mood and affect.    ED Course  Procedures (including critical care time) Labs Review Labs Reviewed  GLUCOSE, CAPILLARY - Abnormal; Notable for the following:    Glucose-Capillary 196 (*)    All other components within normal limits  POCT URINALYSIS DIP (DEVICE)  POCT PREGNANCY, URINE  CERVICOVAGINAL ANCILLARY ONLY   Imaging Review No results found. .this Results for orders placed during the hospital encounter of 10/06/13  GLUCOSE, CAPILLARY      Result Value Ref Range   Glucose-Capillary 196 (*) 70 - 99 mg/dL  POCT URINALYSIS DIP (DEVICE)      Result Value Ref Range   Glucose, UA NEGATIVE  NEGATIVE  mg/dL   Bilirubin Urine NEGATIVE  NEGATIVE   Ketones, ur NEGATIVE  NEGATIVE mg/dL   Specific Gravity, Urine 1.025  1.005 - 1.030   Hgb urine dipstick NEGATIVE  NEGATIVE   pH 6.0  5.0 - 8.0   Protein, ur NEGATIVE  NEGATIVE mg/dL  Urobilinogen, UA 0.2  0.0 - 1.0 mg/dL   Nitrite NEGATIVE  NEGATIVE   Leukocytes, UA NEGATIVE  NEGATIVE  POCT PREGNANCY, URINE      Result Value Ref Range   Preg Test, Ur NEGATIVE  NEGATIVE      MDM   1. PID (acute pelvic inflammatory disease)   2. Cervicitis   3. Vaginal discharge   4. Cellulitis   5. T2DM (type 2 diabetes mellitus)      Rocephin 1 gm po, larger dose to cover periumbilical infection. Azithromycin 1 gm ppo by Rx Flagyl 500 bid Keflex 500  To start tomorrow. zofran 4 mg po now. Pending vag cytology Must see your PCP to have your diabetes managed.    Janne Napoleon, NP 10/06/13 1159

## 2013-10-06 NOTE — ED Notes (Signed)
Multiple complaints.  Patient is a diabetic that has taken metformin, but patient has not taken metformin in 6 months.  Patient reports skin irritation around naval and has concerns about vaginal odor, not so much a discharge.  Patient has been having unprotected sex with boyfriend recently released from jail.  Patient has concerns

## 2013-10-06 NOTE — ED Provider Notes (Signed)
Medical screening examination/treatment/procedure(s) were performed by resident physician or non-physician practitioner and as supervising physician I was immediately available for consultation/collaboration.   Pauline Good MD.   Billy Fischer, MD 10/06/13 [phone]

## 2013-10-06 NOTE — ED Notes (Signed)
Patient not ready for discharge.  Patient has had injection and is waiting prior to discharge.

## 2013-10-06 NOTE — Discharge Instructions (Signed)
Cellulitis Cellulitis is an infection of the skin and the tissue beneath it. The infected area is usually red and tender. Cellulitis occurs most often in the arms and lower legs.  CAUSES  Cellulitis is caused by bacteria that enter the skin through cracks or cuts in the skin. The most common types of bacteria that cause cellulitis are Staphylococcus and Streptococcus. SYMPTOMS   Redness and warmth.  Swelling.  Tenderness or pain.  Fever. DIAGNOSIS  Your caregiver can usually determine what is wrong based on a physical exam. Blood tests may also be done. TREATMENT  Treatment usually involves taking an antibiotic medicine. HOME CARE INSTRUCTIONS   Take your antibiotics as directed. Finish them even if you start to feel better.  Keep the infected arm or leg elevated to reduce swelling.  Apply a warm cloth to the affected area up to 4 times per day to relieve pain.  Only take over-the-counter or prescription medicines for pain, discomfort, or fever as directed by your caregiver.  Keep all follow-up appointments as directed by your caregiver. SEEK MEDICAL CARE IF:   You notice red streaks coming from the infected area.  Your red area gets larger or turns dark in color.  Your bone or joint underneath the infected area becomes painful after the skin has healed.  Your infection returns in the same area or another area.  You notice a swollen bump in the infected area.  You develop new symptoms. SEEK IMMEDIATE MEDICAL CARE IF:   You have a fever.  You feel very sleepy.  You develop vomiting or diarrhea.  You have a general ill feeling (malaise) with muscle aches and pains. MAKE SURE YOU:   Understand these instructions.  Will watch your condition.  Will get help right away if you are not doing well or get worse. Document Released: 04/28/2005 Document Revised: 01/18/2012 Document Reviewed: 10/04/2011 [facility] Patient Information 2014 [place].  Cervicitis Cervicitis is a soreness and puffiness (inflammation) of the cervix.  HOME CARE  Do not have sex (intercourse) until your doctor says it is okay.  Do not have sex until your partner is treated or as told by your doctor.  Take your antibiotic medicine as told. Finish it even if you start to feel better. GET HELP IF:   Yours symptoms that brought you to the doctor come back.  You have a fever. MAKE SURE YOU:   Understand these instructions.  Will watch your condition.  Will get help right away if you are not doing well or get worse. Document Released: 04/27/2008 Document Revised: 03/21/2013 Document Reviewed: 01/10/2013 [facility] Patient Information 2014 [place].  Monitoring for Diabetes There are two blood tests that help you monitor and manage your diabetes. These include:  An A1c (hemoglobin A1c) test.  This test is an average of your glucose (or blood sugar) control over the past 3 months.  This is recommended as a way for you and your caregiver to understand how well your glucose levels are controlled on the average.  Your A1c goal will be determined by your caregiver, but it is usually best if it is less than 6.5% to 7.0%.  Glucose (sugar) attaches itself to red blood cells. The amount of glucose then can then be measured. The amount of glucose on the cells depends on how high your blood glucose has been.  SMBG test (self-monitoring blood glucose).  Using a blood glucose monitor (meter) to do SMBG testing is an easy way to monitor the  amount of glucose in your blood and can help you improve your control. The monitor will tell you what your blood glucose is at that very moment. Every person with diabetes should have a blood glucose monitor and know how to use it. The better you control your blood sugar on a daily basis, the better your A1c levels will be. HOW OFTEN SHOULD I HAVE AN A1C LEVEL?  Every 3 months if your diabetes is not well  controlled or if therapy has changed.  Every 6 months if you are meeting your treatment goals. HOW OFTEN SHOULD I DO SMBG TESTING?  Your caregiver will recommend how often you should test. Testing times are based on the kind of medicine you take, type of diabetes you have, and your blood glucose control. Testing times can include:  Type 1 diabetes: test 3 or 4 times a day or as directed.  Type 2 diabetes and if you are taking insulin and diabetes pills: test 3 or 4 times a day or as directed.  If you are taking diabetes pills only and not reaching your target A1c: test 2 to 4 times a day or as directed.  If you are taking diabetes pills and are controlling your diabetes well with diet and exercise, your caregiver will help you decide what is appropriate. WHAT TIME OF DAY SHOULD I TEST?  The best time of day to test your blood glucose depends on medications, mealtimes, exercise, and blood glucose control. It is best to test at different times because this will help you know how you are doing throughout the day. Your caregiver will help you decide what is best. WHAT SHOULD MY BLOOD GLUCOSE BE? Blood glucose target goals may vary depending on each persons needs, whether they have type 1 or type 2 diabetes or what medications they are taking. However, as a general rule, blood glucose should be:  Before meals   70-130 mg/dl.  After meals    ..less than 180 mg/dl. CHECK YOUR BLOOD GLUCOSE IF:  You have symptoms of low blood sugar (hypoglycemia), which may include dizziness, shaking, sweating, chills and confusion.  You have symptoms of high blood sugar (hyperglycemia), which may include sleepiness, blurred vision, frequent urination and excessive thirst.  You are learning how meals, physical activity and medicine affect your blood glucose level. The more you learn about how various foods, your medications, and activities affect you, the better job you will do of taking care of yourself.  You  have a job in which poor control could cause safety problems while driving or operating machinery. CHECK YOUR BLOOD SUGAR MORE FREQUENTLY:  If you have medication or dietary changes.  If you begin taking other kinds of medicines.  If you become sick or your level of stress increases. With an illness, your blood sugar may even be high without eating.  Before and after exercise. Follow your caregiver's testing recommendations during this time.  TO DISPOSE OF SHARPS: Each city or state may have different regulations. Check with your public works or Theatre manager.  Sharps containers can be purchased from pharmacies.  Place all used sharps in a container. You do not need to replace any protective covers over the needle or break the needle.  Sharps should be contained in a ridge, leakproof, puncture-resistant container.  Plastic detergent bottle.  Bleach bottle.  When container is almost full, add a solution that is 1 part laundry bleach and 9 parts tap water (it is okay to use undiluted  bleach if you wish). You may want to wear gloves since bleach can damage tissue. Let the solution sit for 30 minutes.  Carefully pour all the liquid into the sanitary sewer. Be sure to prevent the sharps from falling out.  Once liquid is drained, reseal the container with lid and tape it shut with duct tape. This will prevent the cap from coming off.  Dispose of the container with your regular household trash and waste. It is a good idea to let your trash hauler know that you will be disposing of sharps. Document Released: 07/22/2003 Document Revised: 04/12/2012 Document Reviewed: 01/20/2009 [facility] Patient Information 2014 [place], [place].  Pelvic Inflammatory Disease Pelvic inflammatory disease (PID) refers to an infection in some or all of the female organs. The infection can be in the uterus, ovaries, fallopian tubes, or the surrounding tissues in the pelvis. PID can cause abdominal or  pelvic pain that comes on suddenly (acute pelvic pain). PID is a serious infection because it can lead to lasting (chronic) pelvic pain or the inability to have children (infertile).  CAUSES  The infection is often caused by the normal bacteria found in the vaginal tissues. PID may also be caused by an infection that is spread during sexual contact. PID can also occur following:   The birth of a baby.   A miscarriage.   An abortion.   Major pelvic surgery.   The use of an intrauterine device (IUD).   A sexual assault.  RISK FACTORS Certain factors can put a person at higher risk for PID, such as:  Being younger than 25 years.  Being sexually active at [place] age.  Usingnonbarrier contraception.  Havingmultiple sexual partners.  Having sex with someone who has symptoms of a genital infection.  Using oral contraception. Other times, certain behaviors can increase the possibility of getting PID, such as:  Having sex during your period.  Using a vaginal douche.  Having an intrauterine device (IUD) in place. SYMPTOMS   Abdominal or pelvic pain.   Fever.   Chills.   Abnormal vaginal discharge.  Abnormal uterine bleeding.   Unusual pain shortly after finishing your period. DIAGNOSIS  Your caregiver will choose some of the following methods to make a diagnosis, such as:   Performinga physical exam and history. A pelvic exam typically reveals a very tender uterus and surrounding pelvis.   Ordering laboratory tests including a pregnancy test, blood tests, and urine test.  Orderingcultures of the vagina and cervix to check for a sexually transmitted infection (STI).  Performing an ultrasound.   Performing a laparoscopic procedure to look inside the pelvis.  TREATMENT   Antibiotic medicines may be prescribed and taken by mouth.   Sexual partners may be treated when the infection is caused by a sexually transmitted disease (STD).    Hospitalization may be needed to give antibiotics intravenously.  Surgery may be needed, but this is rare. It may take weeks until you are completely well. If you are diagnosed with PID, you should also be checked for human immunodeficiency virus (HIV). HOME CARE INSTRUCTIONS   If given, take your antibiotics as directed. Finish the medicine even if you start to feel better.   Only take over-the-counter or prescription medicines for pain, discomfort, or fever as directed by your caregiver.   Do not have sexual intercourse until treatment is completed or as directed by your caregiver. If PID is confirmed, your recent sexual partner(s) will need treatment.   Keep your follow-up appointments. SEEK  MEDICAL CARE IF:   You have increased or abnormal vaginal discharge.   You need prescription medicine for your pain.   You vomit.   You cannot take your medicines.   Your partner has an STD.  SEEK IMMEDIATE MEDICAL CARE IF:   You have a fever.   You have increased abdominal or pelvic pain.   You have chills.   You have pain when you urinate.   You are not better after 72 hours following treatment.  MAKE SURE YOU:   Understand these instructions.  Will watch your condition.  Will get help right away if you are not doing well or get worse. Document Released: 07/19/2005 Document Revised: 11/13/2012 Document Reviewed: 07/15/2011 [facility] Patient Information 2014 [place], [place].  Sexually Transmitted Disease A sexually transmitted disease (STD) is a disease or infection that may be passed (transmitted) from person to person, usually during sexual activity. This may happen by way of saliva, semen, blood, vaginal mucus, or urine. Common STDs include:   Gonorrhea.   Chlamydia.   Syphilis.   HIV and AIDS.   Genital herpes.   Hepatitis B and C.   Trichomonas.   Human papillomavirus (HPV).   Pubic lice.   Scabies.  Mites.  Bacterial  vaginosis. WHAT ARE CAUSES OF STDs? An STD may be caused by bacteria, a virus, or parasites. STDs are often transmitted during sexual activity if one person is infected. However, they may also be transmitted through nonsexual means. STDs may be transmitted after:   Sexual intercourse with an infected person.   Sharing sex toys with an infected person.   Sharing needles with an infected person or using unclean piercing or tattoo needles.  Having intimate contact with the genitals, mouth, or rectal areas of an infected person.   Exposure to infected fluids during birth. WHAT ARE THE SIGNS AND SYMPTOMS OF STDs? Different STDs have different symptoms. Some people may not have any symptoms. If symptoms are present, they may include:   Painful or bloody urination.   Pain in the pelvis, abdomen, vagina, anus, throat, or eyes.   Skin rash, itching, irritation, growths, sores (lesions), ulcerations, or warts in the genital or anal area.  Abnormal vaginal discharge with or without bad odor.   Penile discharge in men.   Fever.   Pain or bleeding during sexual intercourse.   Swollen glands in the groin area.   Yellow skin and eyes (jaundice). This is seen with hepatitis.   Swollen testicles.  Infertility.  Sores and blisters in the mouth. HOW ARE STDs DIAGNOSED? To make a diagnosis, your health care provider may:   Take a medical history.   Perform a physical exam.   Take a sample of any discharge for examination.  Swab the throat, cervix, opening to the penis, rectum, or vagina for testing.  Test a sample of your first morning urine.   Perform blood tests.   Perform a Pap smear, if this applies.   Perform a colposcopy.   Perform a laparoscopy.  HOW ARE STDs TREATED? Treatment depends on the STD. Some STDs may be treated but not cured.   Chlamydia, gonorrhea, trichomonas, and syphilis can be cured with antibiotics.   Genital herpes, hepatitis,  and HIV can be treated, but not cured, with prescribed medicines. The medicines lessen symptoms.   Genital warts from HPV can be treated with medicine or by freezing, burning (electrocautery), or surgery. Warts may come back.   HPV cannot be cured with medicine or surgery.  However, abnormal areas may be removed from the cervix, vagina, or vulva.   If your diagnosis is confirmed, your recent sexual partners need treatment. This is true even if they are symptom-free or have a negative culture or evaluation. They should not have sex until their health care providers say it is OK. HOW CAN I REDUCE MY RISK OF GETTING AN STD?  Use latex condoms, dental dams, and water-soluble lubricants during sexual activity. Do not use petroleum jelly or oils.  Get vaccinated for HPV and hepatitis. If you have not received these vaccines in the past, talk to your health care provider about whether one or both might be right for you.   Avoid risky sex practices that can break the skin.  WHAT SHOULD I DO IF I THINK I HAVE AN STD?  See your health care provider.   Inform all sexual partners. They should be tested and treated for any STDs.  Do not have sex until your health care provider says it is OK. WHEN SHOULD I GET HELP? Seek immediate medical care if:  You develop severe abdominal pain.  You are a man and notice swelling or pain in the testicles.  You are a woman and notice swelling or pain in your vagina. Document Released: 10/09/2002 Document Revised: 05/09/2013 Document Reviewed: 02/06/2013 [facility] Patient Information 2014 [place], [place].

## 2013-10-08 LAB — CERVICOVAGINAL ANCILLARY ONLY
Chlamydia: NEGATIVE
Neisseria Gonorrhea: NEGATIVE
WET PREP (BD AFFIRM): NEGATIVE
Wet Prep (BD Affirm): NEGATIVE
Wet Prep (BD Affirm): NEGATIVE

## 2013-10-09 ENCOUNTER — Telehealth (HOSPITAL_COMMUNITY): Payer: Self-pay | Admitting: Emergency Medicine

## 2013-10-09 NOTE — ED Notes (Signed)
Pt. called for her lab results.  Pt. verified x 2 and given results. (GC/Chlamydia/Affirm all neg., U-dip/ U-Preg. neg., CBG 196.)  Pt. admits she is not taking her Metformin the way she is supposed to take it.  She said she is going to f/u with her PCP. Pt. was relieved that she did not have an STD.  Pt. instructed to always make her partner wear a condom. Roselyn Meier 10/09/2013

## 2013-10-10 ENCOUNTER — Ambulatory Visit (INDEPENDENT_AMBULATORY_CARE_PROVIDER_SITE_OTHER): Payer: Medicaid Other | Admitting: Family Medicine

## 2013-10-10 ENCOUNTER — Encounter: Payer: Self-pay | Admitting: Family Medicine

## 2013-10-10 VITALS — BP 136/89 | HR 69 | Temp 98.5°F | Wt 184.0 lb

## 2013-10-10 DIAGNOSIS — E1165 Type 2 diabetes mellitus with hyperglycemia: Secondary | ICD-10-CM

## 2013-10-10 DIAGNOSIS — IMO0001 Reserved for inherently not codable concepts without codable children: Secondary | ICD-10-CM

## 2013-10-10 DIAGNOSIS — IMO0002 Reserved for concepts with insufficient information to code with codable children: Secondary | ICD-10-CM

## 2013-10-10 NOTE — Patient Instructions (Addendum)
Stop the azithromycin (Zpack)  and the flagyl (metronidazole)  Continue to take the keflex (cephalexin) until its gone  You do not have a STD  Consider taking the metformin and glipizide as prescribed  Follow up with your mental health provider and Dr Marily Memos

## 2013-10-10 NOTE — Assessment & Plan Note (Signed)
Recommended to take her diabetes medications and follow up with PCP.   May not need both if A1c is controlled

## 2013-10-10 NOTE — Progress Notes (Signed)
   Subjective:    Patient ID: [name], female    DOB: 18-Nov-1975, 38 y.o.   MRN: [id number]  HPI  Cellulitis and PID Seen in ER with these diagnosises.  The next day develop abscess in her umbilicus which she popped.  Has been draining clear fluid and redness and pain are much decreased.  Taking all 3 antibiotics.  No pelvic pain but did start her menstrual period right after ER visit.  No rashes   Diabetes Not taking her medications.  No polyuria or dipsia  Review of Symptoms - see HPI  PMH - Smoking status noted.     Review of Systems     Objective:   Physical Exam  Alert no acute distress Navel - no redness small area of soft tissue swelling with scant clear discharge Abdomen: soft and non-tender without masses, organomegaly or hernias noted.  No guarding or rebound Lungs:  Normal respiratory effort, chest expands symmetrically. Lungs are clear to auscultation, no crackles or wheezes. Skin:  Intact without suspicious lesions or rashes       Assessment & Plan:   Abscess - resolving,  Finish cephalexin  PID - doubt had PID given other diagnosis and resolution of pain with menstrual period.  Will stop other antibiotics

## 2014-01-20 ENCOUNTER — Emergency Department (HOSPITAL_COMMUNITY)
Admission: EM | Admit: 2014-01-20 | Discharge: 2014-01-20 | Payer: Medicaid Other | Attending: Emergency Medicine | Admitting: Emergency Medicine

## 2014-01-20 ENCOUNTER — Encounter (HOSPITAL_COMMUNITY): Payer: Self-pay | Admitting: Emergency Medicine

## 2014-01-20 DIAGNOSIS — F17200 Nicotine dependence, unspecified, uncomplicated: Secondary | ICD-10-CM | POA: Insufficient documentation

## 2014-01-20 DIAGNOSIS — Z872 Personal history of diseases of the skin and subcutaneous tissue: Secondary | ICD-10-CM | POA: Insufficient documentation

## 2014-01-20 DIAGNOSIS — R451 Restlessness and agitation: Secondary | ICD-10-CM

## 2014-01-20 DIAGNOSIS — F911 Conduct disorder, childhood-onset type: Secondary | ICD-10-CM | POA: Diagnosis present

## 2014-01-20 DIAGNOSIS — Z8709 Personal history of other diseases of the respiratory system: Secondary | ICD-10-CM | POA: Insufficient documentation

## 2014-01-20 DIAGNOSIS — E669 Obesity, unspecified: Secondary | ICD-10-CM | POA: Insufficient documentation

## 2014-01-20 DIAGNOSIS — IMO0002 Reserved for concepts with insufficient information to code with codable children: Secondary | ICD-10-CM | POA: Diagnosis not present

## 2014-01-20 DIAGNOSIS — Z8632 Personal history of gestational diabetes: Secondary | ICD-10-CM | POA: Diagnosis not present

## 2014-01-20 DIAGNOSIS — F319 Bipolar disorder, unspecified: Secondary | ICD-10-CM | POA: Diagnosis not present

## 2014-01-20 DIAGNOSIS — Z79899 Other long term (current) drug therapy: Secondary | ICD-10-CM | POA: Diagnosis not present

## 2014-01-20 DIAGNOSIS — F1210 Cannabis abuse, uncomplicated: Secondary | ICD-10-CM | POA: Insufficient documentation

## 2014-01-20 LAB — COMPREHENSIVE METABOLIC PANEL
ALT: 16 U/L (ref 0–35)
AST: 20 U/L (ref 0–37)
Albumin: 4.4 g/dL (ref 3.5–5.2)
Alkaline Phosphatase: 62 U/L (ref 39–117)
BUN: 12 mg/dL (ref 6–23)
CO2: 17 meq/L — AB (ref 19–32)
CREATININE: 1.12 mg/dL — AB (ref 0.50–1.10)
Calcium: 9.3 mg/dL (ref 8.4–10.5)
Chloride: 96 mEq/L (ref 96–112)
GFR, EST AFRICAN AMERICAN: 71 mL/min — AB (ref >90)
GFR, EST NON AFRICAN AMERICAN: 61 mL/min — AB (ref >90)
GLUCOSE: 252 mg/dL — AB (ref 70–99)
Potassium: 3.5 mEq/L — ABNORMAL LOW (ref 3.7–5.3)
Sodium: 134 mEq/L — ABNORMAL LOW (ref 137–147)
Total Bilirubin: 0.7 mg/dL (ref 0.3–1.2)
Total Protein: 8.2 g/dL (ref 6.0–8.3)

## 2014-01-20 LAB — RAPID URINE DRUG SCREEN, HOSP PERFORMED
Amphetamines: NOT DETECTED
Barbiturates: NOT DETECTED
Benzodiazepines: NOT DETECTED
COCAINE: NOT DETECTED
Opiates: NOT DETECTED
Tetrahydrocannabinol: POSITIVE — AB

## 2014-01-20 LAB — CBC WITH DIFFERENTIAL/PLATELET
BASOS PCT: 0 % (ref 0–1)
Basophils Absolute: 0 10*3/uL (ref 0.0–0.1)
EOS PCT: 0 % (ref 0–5)
Eosinophils Absolute: 0 10*3/uL (ref 0.0–0.7)
HCT: 39.2 % (ref 36.0–46.0)
Hemoglobin: 14.6 g/dL (ref 12.0–15.0)
LYMPHS ABS: 3.3 10*3/uL (ref 0.7–4.0)
Lymphocytes Relative: 29 % (ref 12–46)
MCH: 27.5 pg (ref 26.0–34.0)
MCHC: 37.2 g/dL — ABNORMAL HIGH (ref 30.0–36.0)
MCV: 74 fL — AB (ref 78.0–100.0)
MONO ABS: 1 10*3/uL (ref 0.1–1.0)
Monocytes Relative: 9 % (ref 3–12)
NEUTROS ABS: 7.2 10*3/uL (ref 1.7–7.7)
Neutrophils Relative %: 62 % (ref 43–77)
PLATELETS: 363 10*3/uL (ref 150–400)
RBC: 5.3 MIL/uL — AB (ref 3.87–5.11)
RDW: 15 % (ref 11.5–15.5)
WBC: 11.5 10*3/uL — ABNORMAL HIGH (ref 4.0–10.5)

## 2014-01-20 LAB — ETHANOL: Alcohol, Ethyl (B): <11 mg/dL (ref 0–11)

## 2014-01-20 LAB — HCG, SERUM, QUALITATIVE: Preg, Serum: NEGATIVE

## 2014-01-20 MED ORDER — ZIPRASIDONE MESYLATE 20 MG IM SOLR
20.0000 mg | Freq: Once | INTRAMUSCULAR | Status: AC
  Administered 2014-01-20: 20 mg via INTRAMUSCULAR

## 2014-01-20 MED ORDER — ZIPRASIDONE MESYLATE 20 MG IM SOLR: 20.0000 mg | Freq: Once | INTRAMUSCULAR | Status: AC

## 2014-01-20 MED ORDER — SODIUM CHLORIDE 0.9 % IV BOLUS (SEPSIS)
1000.0000 mL | Freq: Once | INTRAVENOUS | Status: AC
  Administered 2014-01-20: 1000 mL via INTRAVENOUS

## 2014-01-20 NOTE — ED Notes (Signed)
Pt attempted to Bite this RN when trying to obtain VS. Will attempt manual BP.

## 2014-01-20 NOTE — ED Notes (Signed)
[employer] officers remain at bedside.

## 2014-01-20 NOTE — ED Notes (Signed)
Hard restraints removed from left and right ankles. Pt sleeping but gets agitated when aroused.

## 2014-01-20 NOTE — ED Notes (Signed)
Pt alert and oriented when name is called. She answers questions appropriately but when asked if she knew why she was here she states "you tell me".

## 2014-01-20 NOTE — ED Notes (Signed)
Pt too agitated to obtain vital signs. Pt repeatedly screaming for ice water. Ice water provided pt refuses to drink. Pt screaming and cursing at staff and officers to see her children. This RN is unable to obtain any medical HX from patient d/t to behavior.

## 2014-01-20 NOTE — BH Assessment (Signed)
Assessment completed. Consulted Julie Colonel, NP who agrees that patient does not meet inpatient criteria. Dr. Fuller Plan has been notified that patient does not meet inpatient criteria due to patient denying SI, HI, AH and VH at this time.

## 2014-01-20 NOTE — ED Notes (Signed)
MD Regenia Skeeter at bedside. Pt not wanting to answer questions and requests to be left alone. Pt pulling blankets back over her head.

## 2014-01-20 NOTE — ED Notes (Signed)
Pt attempting to spit in Staff and [employer] during blood draw.

## 2014-01-20 NOTE — ED Notes (Addendum)
Wrist restraints removed from patient. Pt agrees to not be aggressive and threatening towards staff. She also agrees to speak with MD and answer his questions. Provided pt with ice water per request and lights dimmed per request. Pt is alert and oriented and just reports that she is tired. Pt denies SI or HI.

## 2014-01-20 NOTE — ED Notes (Signed)
Bed: ZJ67 Expected date:  Expected time:  Means of arrival:  Comments: [id number]

## 2014-01-20 NOTE — ED Notes (Signed)
MD Regenia Skeeter at bedside speaking with [employer].
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
## 2014-01-20 NOTE — ED Notes (Signed)
Pt sleeping but meal tray was offered.

## 2014-01-20 NOTE — ED Notes (Addendum)
Jake Bathe was asked about patient disposition since assessment. Informed pt. Beginning to act out.   Pt is her room, with random, loud outburst saying she has not been getting proper treatment. Since waking up for her assessment request that she has made for sandwich and beverages have been granted. She spontaneously started with stating room is cold. She was given a warm blanket and temperature was adjusted. She states her eye is hurting however she refused for anyone to touch. Being that she was violent and combative and in restraints prior to shift advise was not taken lightly. Encourage pt. To call out for future concerns. She asked to have light turned off and this nurse to exit the room.  Jake Bathe will call back with plan for disposition. [employer] at bedside also aware.
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
## 2014-01-20 NOTE — ED Notes (Addendum)
Pt resting. MD Jeneen Rinks aware of BP.
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
## 2014-02-22 ENCOUNTER — Ambulatory Visit (INDEPENDENT_AMBULATORY_CARE_PROVIDER_SITE_OTHER): Payer: Medicaid Other | Admitting: Family Medicine

## 2014-02-22 ENCOUNTER — Other Ambulatory Visit (HOSPITAL_COMMUNITY)
Admission: RE | Admit: 2014-02-22 | Discharge: 2014-02-22 | Disposition: A | Discharge status: Home / Self Care | Payer: Medicaid Other | Source: Ambulatory Visit | Attending: Family Medicine | Admitting: Family Medicine

## 2014-02-22 ENCOUNTER — Encounter: Payer: Self-pay | Admitting: Family Medicine

## 2014-02-22 ENCOUNTER — Telehealth: Payer: Self-pay | Admitting: Family Medicine

## 2014-02-22 VITALS — BP 127/90 | HR 84 | Temp 97.5°F | Ht 63.0 in | Wt 176.0 lb

## 2014-02-22 DIAGNOSIS — IMO0002 Reserved for concepts with insufficient information to code with codable children: Secondary | ICD-10-CM

## 2014-02-22 DIAGNOSIS — Z01419 Encounter for gynecological examination (general) (routine) without abnormal findings: Secondary | ICD-10-CM | POA: Diagnosis present

## 2014-02-22 DIAGNOSIS — Z113 Encounter for screening for infections with a predominantly sexual mode of transmission: Secondary | ICD-10-CM | POA: Insufficient documentation

## 2014-02-22 DIAGNOSIS — Z124 Encounter for screening for malignant neoplasm of cervix: Secondary | ICD-10-CM

## 2014-02-22 DIAGNOSIS — R61 Generalized hyperhidrosis: Secondary | ICD-10-CM

## 2014-02-22 DIAGNOSIS — E1165 Type 2 diabetes mellitus with hyperglycemia: Secondary | ICD-10-CM

## 2014-02-22 DIAGNOSIS — IMO0001 Reserved for inherently not codable concepts without codable children: Secondary | ICD-10-CM

## 2014-02-22 DIAGNOSIS — N951 Menopausal and female climacteric states: Secondary | ICD-10-CM

## 2014-02-22 DIAGNOSIS — R232 Flushing: Secondary | ICD-10-CM

## 2014-02-22 DIAGNOSIS — Z7251 High risk heterosexual behavior: Secondary | ICD-10-CM

## 2014-02-22 DIAGNOSIS — N898 Other specified noninflammatory disorders of vagina: Secondary | ICD-10-CM

## 2014-02-22 LAB — POCT WET PREP (WET MOUNT): CLUE CELLS WET PREP WHIFF POC: POSITIVE

## 2014-02-22 LAB — CBC WITH DIFFERENTIAL/PLATELET
BASOS PCT: 0 % (ref 0–1)
Basophils Absolute: 0 10*3/uL (ref 0.0–0.1)
EOS ABS: 0.2 10*3/uL (ref 0.0–0.7)
Eosinophils Relative: 2 % (ref 0–5)
HCT: 37.7 % (ref 36.0–46.0)
Hemoglobin: 13.3 g/dL (ref 12.0–15.0)
Lymphocytes Relative: 42 % (ref 12–46)
Lymphs Abs: 4 10*3/uL (ref 0.7–4.0)
MCH: 26.9 pg (ref 26.0–34.0)
MCHC: 35.3 g/dL (ref 30.0–36.0)
MCV: 76.2 fL — ABNORMAL LOW (ref 78.0–100.0)
MONO ABS: 0.5 10*3/uL (ref 0.1–1.0)
Monocytes Relative: 5 % (ref 3–12)
NEUTROS PCT: 51 % (ref 43–77)
Neutro Abs: 4.9 10*3/uL (ref 1.7–7.7)
Platelets: 481 10*3/uL — ABNORMAL HIGH (ref 150–400)
RBC: 4.95 MIL/uL (ref 3.87–5.11)
RDW: 16.7 % — ABNORMAL HIGH (ref 11.5–15.5)
WBC: 9.6 10*3/uL (ref 4.0–10.5)

## 2014-02-22 LAB — TSH: TSH: 0.557 u[IU]/mL (ref 0.350–4.500)

## 2014-02-22 LAB — POCT GLYCOSYLATED HEMOGLOBIN (HGB A1C): Hemoglobin A1C: 7.7

## 2014-02-22 MED ORDER — METFORMIN HCL 500 MG PO TABS: 500.0000 mg | ORAL_TABLET | Freq: Two times a day (BID) | ORAL | Status: DC

## 2014-02-22 MED ORDER — ALBUTEROL SULFATE HFA 108 (90 BASE) MCG/ACT IN AERS: 2.0000 | INHALATION_SPRAY | Freq: Four times a day (QID) | RESPIRATORY_TRACT | Status: DC | PRN

## 2014-02-22 MED ORDER — GLIPIZIDE 5 MG PO TABS: 5.0000 mg | ORAL_TABLET | Freq: Two times a day (BID) | ORAL | Status: DC

## 2014-02-22 MED ORDER — PRAVASTATIN SODIUM 20 MG PO TABS: 20.0000 mg | ORAL_TABLET | Freq: Every day | ORAL | Status: DC

## 2014-02-22 MED ORDER — METRONIDAZOLE 500 MG PO TABS: 500.0000 mg | ORAL_TABLET | Freq: Two times a day (BID) | ORAL | Status: DC

## 2014-02-22 NOTE — Progress Notes (Signed)
Patient ID: [name], female   DOB: October 26, 1975, 38 y.o.   MRN: [id number]   [facility] Julie Bell, MD Phone: [phone]  Subjective:  [name] is a 38 y.o F who presents today for a gyne exam   # questionable pre-menopausal? -sweating, at night especially, also experiencing irritability, moodiness, and difficulty with sleeping  -the sx have lasted for approx about the 3 months -LMP 7/16- cycles usually last 3-4 days, has also noted increased cramping; usually almost a month long cycle -has been taking double doses of bipolar meds and is not feeling any better; also taking "stress reduction" otc medication -denies cough, cold, congestion, unintentional weight loss -has been incarcerated for distinct period of time   All systems were reviewed and were negative unless otherwise noted in the HPI  Past Medical History Patient Active Problem List   Diagnosis Date Noted  . 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 Asthma, chronic 03/12/2013  . Hyperlipidemia 12/10/2011  . DM (diabetes mellitus), type 2, uncontrolled 12/07/2011  . HIDRADENITIS SUPPURATIVA 06/12/2009  . OBESITY, UNSPECIFIED 10/31/2008  . BACK PAIN, LUMBAR 10/31/2008  . BPLR I, MIXED, MOST RECENT EPSD, MODERATE 01/25/2007  . TOBACCO DEPENDENCE 09/29/2006  . PAPANICOLAOU SMEAR, ABNORMAL 09/29/2006  . PAPANICOLAOU SMEAR, ABNORMAL 09/29/2006   Reviewed problem list.  Medications- reviewed and updated Chief complaint-noted No additions to family history Social history- patient is a current every day smoker  Objective: BP 127/90  Pulse 84  Temp(Src) 97.5 F (36.4 C) (Oral)  Ht 5\' 3"  (1.6 m)  Wt 176 lb (79.833 kg)  BMI 31.18 kg/m2  LMP 02/14/2014 Gen: NAD, alert, cooperative with exam HEENT: NCAT, EOMI Neck: FROM, supple CV: RRR, good S1/S2, no murmur, cap refill <3 Resp: CTABL, no wheezes, non-labored GU: copious white smelly discharge in the vaginal vault no lesion appreciated  Ext: No  edema, warm, normal tone, moves UE/LE spontaneously Neuro: Alert and oriented, No gross deficits Skin: no rashes no lesions  Assessment/Plan: See problem based a/p

## 2014-02-22 NOTE — Patient Instructions (Signed)
Ms Leis it was great to see you today!  We will test your hormone levels today as well as other reasons for sweating I will call you if any of the results look abnormal  We also check your hgbA1C today. Please start taking your medications again as prescribed  Schedule a follow up visit with me in 3-4 weeks and we can discuss further  Looking forward to seeing you soon Bernadene Bell, MD

## 2014-02-22 NOTE — Assessment & Plan Note (Addendum)
Broad ddx at this time Potentially related to her mood medications  And/or doubling up on these vitamin supplements Seems to have had fairly regular periods but hx is unclear Does potentially have hx of ovarian cyst Also at risk for ID given high risk behavior and incarceration P: LH/FSH HIV Hep C Called Dr. Ailene Rud office and lvm for pt to set up apptmt CBC GCCT PAP- had high risk HPV 1 year ago

## 2014-02-23 LAB — HEPATITIS C ANTIBODY: HCV Ab: NEGATIVE

## 2014-02-23 LAB — FSH/LH
FSH: 5.9 m[IU]/mL
LH: 4.7 m[IU]/mL

## 2014-02-23 LAB — HIV ANTIBODY (ROUTINE TESTING W REFLEX): HIV: NONREACTIVE

## 2014-02-25 NOTE — Telephone Encounter (Signed)
Called to let her know results of tests. Will f/up in august with me for DM control Is keeping apptmt with Dr. Alphonzo Grieve on Wednesday Will tx herself for BV with flagyl [facility], MD

## 2014-02-26 LAB — CYTOLOGY - PAP

## 2014-03-11 ENCOUNTER — Ambulatory Visit: Payer: Self-pay | Admitting: Family Medicine

## 2014-05-17 ENCOUNTER — Encounter: Payer: Self-pay | Admitting: Family Medicine

## 2014-05-17 ENCOUNTER — Ambulatory Visit (INDEPENDENT_AMBULATORY_CARE_PROVIDER_SITE_OTHER): Payer: Medicaid Other | Admitting: Family Medicine

## 2014-05-17 VITALS — BP 127/82 | HR 73 | Temp 98.2°F | Wt 188.0 lb

## 2014-05-17 DIAGNOSIS — E1165 Type 2 diabetes mellitus with hyperglycemia: Secondary | ICD-10-CM

## 2014-05-17 DIAGNOSIS — IMO0002 Reserved for concepts with insufficient information to code with codable children: Secondary | ICD-10-CM

## 2014-05-17 LAB — GLUCOSE, CAPILLARY: Glucose-Capillary: 257 mg/dL — ABNORMAL HIGH (ref 70–99)

## 2014-05-17 MED ORDER — GLIPIZIDE 10 MG PO TABS: 10.0000 mg | ORAL_TABLET | Freq: Two times a day (BID) | ORAL | Status: DC

## 2014-05-17 MED ORDER — ONETOUCH ULTRASOFT LANCETS MISC: Status: DC

## 2014-05-17 NOTE — Patient Instructions (Signed)
I think you are feeling tired and weak because of your high blood sugars and some dehydration.   I am increasing your glipizide to 10mg  twice a day to help get your sugars under control. Please make sure you are drinking plenty of fluids, at least 8 glasses a day for the few days until you feel better. Try to limit fried food and sweets in your diet and eat plenty of vegetables and lean proteins, like chicken.

## 2014-06-02 NOTE — Progress Notes (Signed)
   Subjective:    Patient ID: [name], female    DOB: 02-12-1976, 38 y.o.   MRN: [id number]  HPI Pt presents for fatigue and hyperglycemia. Pt reports ~1 week of feeling tired and weak and having uncharacteristically high blood sugars of 200-250. Denies, fever, cough, n/v, abdominal pain, diarrhea, cp, sob. Has been eating and drinking normally, denies dietary indiscretion.   Review of Systems See HPI    Objective:   Physical Exam  Constitutional: She is oriented to person, place, and time. She appears well-developed and well-nourished. No distress.  HENT:  Head: Normocephalic and atraumatic.  Eyes: Conjunctivae are normal. Right eye exhibits no discharge. Left eye exhibits no discharge. No scleral icterus.  Cardiovascular: Normal rate, regular rhythm, normal heart sounds and intact distal pulses.   No murmur heard. Pulmonary/Chest: Effort normal and breath sounds normal. No respiratory distress. She has no wheezes.  Abdominal: Soft. Bowel sounds are normal. She exhibits no distension. There is no tenderness.  Neurological: She is alert and oriented to person, place, and time.  Skin: Skin is warm and dry. No rash noted. She is not diaphoretic.  Psychiatric: She has a normal mood and affect. Her behavior is normal.  Nursing note and vitals reviewed.         Assessment & Plan:

## 2014-06-02 NOTE — Assessment & Plan Note (Signed)
High sugars likely causing fatigue. Weakness, some degree of dehydration - oral rehydration - continue metformin - increase glipizide to 10mg  - f/u w/ pcp in 2-3 weeks, sooner if not feeling better

## 2014-06-17 ENCOUNTER — Telehealth: Payer: Self-pay | Admitting: Family Medicine

## 2014-06-17 DIAGNOSIS — E1165 Type 2 diabetes mellitus with hyperglycemia: Secondary | ICD-10-CM

## 2014-06-17 DIAGNOSIS — IMO0002 Reserved for concepts with insufficient information to code with codable children: Secondary | ICD-10-CM

## 2014-06-17 NOTE — Telephone Encounter (Signed)
Needs refill on test strips. Also wants to know how often she should change them. Also does medicaid cover test strips?

## 2014-06-18 MED ORDER — GLUCOSE BLOOD VI STRP: ORAL_STRIP | Status: DC

## 2014-06-18 MED ORDER — ONETOUCH ULTRASOFT LANCETS MISC: Status: AC

## 2014-06-18 NOTE — Telephone Encounter (Signed)
Lancets should be changes every time to minimize risk of infection. Will refill test strips. Does she have medicaid? She is listed as no insurance. That may make a difference as to which type I prescribe. [facility], MD

## 2014-06-18 NOTE — Telephone Encounter (Signed)
Will forward to MD. Radhika Dershem,CMA  

## 2014-06-18 NOTE — Telephone Encounter (Signed)
Correction:  She wants to know how often to change the lancets

## 2014-06-18 NOTE — Telephone Encounter (Signed)
Pt states that she is using the one touch ultra 2.  She will need both the lancets and test strips.  She does currently have medicaid and was originally given this meter while in clinic. Jazmin Hartsell,CMA

## 2014-10-04 ENCOUNTER — Other Ambulatory Visit (HOSPITAL_COMMUNITY)
Admission: RE | Admit: 2014-10-04 | Discharge: 2014-10-04 | Disposition: A | Discharge status: Home / Self Care | Payer: Medicaid Other | Source: Ambulatory Visit | Attending: Family Medicine | Admitting: Family Medicine

## 2014-10-04 ENCOUNTER — Ambulatory Visit (INDEPENDENT_AMBULATORY_CARE_PROVIDER_SITE_OTHER): Payer: Medicaid Other | Admitting: Family Medicine

## 2014-10-04 ENCOUNTER — Encounter: Payer: Self-pay | Admitting: Family Medicine

## 2014-10-04 VITALS — BP 129/88 | HR 84 | Temp 98.2°F | Ht 63.0 in | Wt 191.0 lb

## 2014-10-04 DIAGNOSIS — M545 Low back pain: Secondary | ICD-10-CM

## 2014-10-04 DIAGNOSIS — Z113 Encounter for screening for infections with a predominantly sexual mode of transmission: Secondary | ICD-10-CM | POA: Insufficient documentation

## 2014-10-04 DIAGNOSIS — R3915 Urgency of urination: Secondary | ICD-10-CM

## 2014-10-04 DIAGNOSIS — M549 Dorsalgia, unspecified: Secondary | ICD-10-CM

## 2014-10-04 DIAGNOSIS — N898 Other specified noninflammatory disorders of vagina: Secondary | ICD-10-CM

## 2014-10-04 LAB — POCT URINALYSIS DIPSTICK
Bilirubin, UA: NEGATIVE
Blood, UA: NEGATIVE
GLUCOSE UA: 500
Ketones, UA: NEGATIVE
LEUKOCYTES UA: NEGATIVE
Nitrite, UA: NEGATIVE
Protein, UA: NEGATIVE
Spec Grav, UA: <=1.005
UROBILINOGEN UA: 0.2
pH, UA: 5.5

## 2014-10-04 LAB — POCT WET PREP (WET MOUNT): CLUE CELLS WET PREP WHIFF POC: NEGATIVE

## 2014-10-04 MED ORDER — KETOROLAC TROMETHAMINE 30 MG/ML IJ SOLN
30.0000 mg | Freq: Once | INTRAMUSCULAR | Status: AC
  Administered 2014-10-04: 30 mg via INTRAMUSCULAR

## 2014-10-04 MED ORDER — CYCLOBENZAPRINE HCL 10 MG PO TABS: 10.0000 mg | ORAL_TABLET | Freq: Three times a day (TID) | ORAL | Status: DC | PRN

## 2014-10-04 MED ORDER — IBUPROFEN 600 MG PO TABS: 600.0000 mg | ORAL_TABLET | Freq: Three times a day (TID) | ORAL | Status: DC | PRN

## 2014-10-04 NOTE — Patient Instructions (Signed)
Nice to see you. Your pain is likely related to a muscle strain. Please remain active with this. You can use heat on the area. Please use the ibuprofen and flexeril as prescribed. If you develop fever, numbness, weakness, loss or bowel or bladder function please seek medical attention.

## 2014-10-07 LAB — URINE CYTOLOGY ANCILLARY ONLY
CHLAMYDIA, DNA PROBE: NEGATIVE
Neisseria Gonorrhea: NEGATIVE

## 2014-10-07 NOTE — Progress Notes (Signed)
Patient ID: [name], female   DOB: November 21, 1975, 39 y.o.   MRN: [id number]  Tommi Rumps, MD Phone: [phone]  [name] is a 39 y.o. female who presents today for same day appointment.  Back pain: notes left sided. Different than her chronic back pain. Came on last night like she pulled something. Sharp pain. Intermittent. Worse with movement. No specific injury. No weakness, numbness, saddle anesthesia, loss of bowel or bladder function, fever, or history of cancer. Took flexeril without much benefit.   Also notes vaginal tingling, increased frequency and urgency of urination for the past 2 weeks. No hematuria. No dysuria or discharge.   Patient is a smoker.   ROS: Per HPI   Physical Exam Filed Vitals:   10/04/14 1040  BP: 129/88  Pulse: 84  Temp: 98.2 F (36.8 C)    Gen: Well NAD HEENT: PERRL,  MMM Lungs: CTABL Nl WOB Heart: RRR  Abd: soft, NT, ND Neuro: 5/5 strength in bilateral quads, hamstrings, plantar and dorsiflexion, sensation to light touch intact in bilateral LE, normal gait, 2+ patellar reflexes MSK: tenderness to palpation of left lower back, no midline tenderness, no CVA tenderness, swelling, no spasm GU: patient refused speculum exam Exts: Non edematous BL  LE, warm and well perfused.    Assessment/Plan: Please see individual problem list.  Tommi Rumps, MD [place] PGY-3

## 2014-10-07 NOTE — Assessment & Plan Note (Addendum)
Patient with acute exacerbation of chronic MSK back pain. No red flags on history or exam. No signs of infection on UA. No infection on wet prep or GC/chlamydia. No abdominal pain on exam. Patient refused speculum exam. Will treat acute pain with ibuprofen and flexeril. Advised to stay active. Heat to area. Given return precautions.

## 2014-10-08 NOTE — Progress Notes (Signed)
I was available as preceptor to resident for this patient's office visit.  

## 2014-10-09 ENCOUNTER — Telehealth: Payer: Self-pay | Admitting: *Deleted

## 2014-10-09 NOTE — Telephone Encounter (Signed)
Letter mailed to patients. Hamid Brookens,CMA

## 2014-10-15 ENCOUNTER — Telehealth: Payer: Self-pay | Admitting: Student

## 2014-10-15 NOTE — Telephone Encounter (Signed)
I would suggest that the patient come in for a pelvic exam with her PCP. We were not able to do this at her visit as she opted to no do this due to her back pain. It does not appear to be infectious based on the lab work, though it could be some other irritant that we could see on exam. Thanks.

## 2014-10-15 NOTE — Telephone Encounter (Signed)
Pt is aware and appt was made for tomorrow with Dr. Awanda Mink.  PCP's next available is not until April. [name],CMA

## 2014-10-15 NOTE — Telephone Encounter (Signed)
Will forward to Dr. Caryl Bis to advise. Jazmin Hartsell,CMA

## 2014-10-15 NOTE — Telephone Encounter (Signed)
Pt is having continued vaginal irritation, says all labs were negative but is still itching. Wants to know what MD suggests.

## 2014-10-16 ENCOUNTER — Encounter: Payer: Self-pay | Admitting: Family Medicine

## 2014-10-16 ENCOUNTER — Ambulatory Visit (INDEPENDENT_AMBULATORY_CARE_PROVIDER_SITE_OTHER): Payer: Medicaid Other | Admitting: Family Medicine

## 2014-10-16 VITALS — BP 124/85 | HR 80 | Temp 98.5°F | Ht 63.0 in | Wt 186.5 lb

## 2014-10-16 DIAGNOSIS — N898 Other specified noninflammatory disorders of vagina: Secondary | ICD-10-CM

## 2014-10-16 DIAGNOSIS — B3731 Acute candidiasis of vulva and vagina: Secondary | ICD-10-CM

## 2014-10-16 DIAGNOSIS — L298 Other pruritus: Secondary | ICD-10-CM

## 2014-10-16 DIAGNOSIS — B373 Candidiasis of vulva and vagina: Secondary | ICD-10-CM

## 2014-10-16 LAB — POCT WET PREP (WET MOUNT): CLUE CELLS WET PREP WHIFF POC: NEGATIVE

## 2014-10-16 MED ORDER — FLUCONAZOLE 150 MG PO TABS: 150.0000 mg | ORAL_TABLET | Freq: Once | ORAL | Status: DC

## 2014-10-16 NOTE — Assessment & Plan Note (Addendum)
Rx diflucan x1. Urged to only take medications prescribed for her. Also urged to use gentle cleansers, avoid other hygiene products.

## 2014-10-16 NOTE — Progress Notes (Signed)
I was the preceptor for this visit. 

## 2014-10-16 NOTE — Progress Notes (Signed)
Subjective: [name] is a 39 y.o. female patient of Dr. Yetta Flock Forbes's presenting for vulvar pruritus.  She reports 1 week of gradually constantly increasing severe pruritus of the anterior vulva. She was evaluated October 04, 2014 for vaginal "tingling" in the setting of urinary urgency and frequency with back pain. Self-swab wet prep was negative and she was diagnosed with muscle strain of the lower back. She continued to believe she had a bladder infection so she took some antibiotics prescribed to her friend for a UTI. This did not help any symptoms and in fact made her itching worse. Nothing else tried. No fever, vaginal discharge, bleeding, dysuria, urinary frequency or urgency, or change in bowel habits. No rashes.   Has a history of hidradenitis which is worsened when she is menstruating but denies any boils at this time.   Objective: BP 124/85 mmHg  Pulse 80  Temp(Src) 98.5 F (36.9 C) (Oral)  Ht 5\' 3"  (1.6 m)  Wt 186 lb 8 oz (84.596 kg)  BMI 33.05 kg/m2  LMP 10/05/2014 (Approximate) Gen: Well-appearing 39 y.o. female in no distress. Skin: No rash on palms or soles.  Pelvic: External genitalia, specifically the anterior introitus and clitoris (wher pt identifies worst symptoms) is without excoriation or lesions.  Vaginal mucosa pink, normal rugae. Nonfriable cervix without lesions or bleeding. +Thick white discharge in vaginal vault.  Bimanual exam revealed normal, nongravid uterus.  No cervical motion tenderness. No adnexal masses or inguinal lymphadenopathy.   [name], CMA present throughout duration of exam.   Assessment/Plan: [name] is a 39 y.o. female here for vulvar pruritus - likely vulvovaginal candidiasis and/or lichen sclerosus in setting of hidradenitis suppurativa.  See problem list for plan.

## 2014-10-17 ENCOUNTER — Telehealth: Payer: Self-pay | Admitting: Student

## 2014-10-17 DIAGNOSIS — B373 Candidiasis of vulva and vagina: Secondary | ICD-10-CM

## 2014-10-17 DIAGNOSIS — B3731 Acute candidiasis of vulva and vagina: Secondary | ICD-10-CM

## 2014-10-17 NOTE — Telephone Encounter (Signed)
Will forward to MD who saw patient on 10/16/14 for lab results for advice. Dequante Tremaine, CMA.

## 2014-10-17 NOTE — Telephone Encounter (Signed)
Would like yesterdays test results   °

## 2014-10-18 NOTE — Telephone Encounter (Signed)
The results showed only a very mild (but presumably clinically significant) yeast infection for which diflucan was sent to her pharmacy. The test was negative for trichomonas and BV. No other STI's were checked for. Can you call to let her know this?

## 2014-10-21 NOTE — Telephone Encounter (Signed)
Spoke with patient and she stated that she took medication , but then started her period and now is itching again bad. Would like to know if we can call in another diflucan pill for her

## 2014-10-22 MED ORDER — FLUCONAZOLE 150 MG PO TABS: 150.0000 mg | ORAL_TABLET | Freq: Once | ORAL | Status: DC

## 2014-10-22 NOTE — Telephone Encounter (Signed)
Will call in one more refill. If persisting, would likely need return appointment

## 2014-10-22 NOTE — Telephone Encounter (Signed)
Spoke with patient and informed her of below 

## 2014-11-07 ENCOUNTER — Ambulatory Visit (INDEPENDENT_AMBULATORY_CARE_PROVIDER_SITE_OTHER): Payer: Medicaid Other | Admitting: Student

## 2014-11-07 ENCOUNTER — Encounter: Payer: Self-pay | Admitting: Student

## 2014-11-07 VITALS — BP 140/78 | HR 62 | Temp 98.3°F | Ht 63.0 in | Wt 185.0 lb

## 2014-11-07 DIAGNOSIS — B373 Candidiasis of vulva and vagina: Secondary | ICD-10-CM | POA: Diagnosis not present

## 2014-11-07 DIAGNOSIS — B3731 Acute candidiasis of vulva and vagina: Secondary | ICD-10-CM

## 2014-11-07 DIAGNOSIS — E1165 Type 2 diabetes mellitus with hyperglycemia: Secondary | ICD-10-CM

## 2014-11-07 DIAGNOSIS — N898 Other specified noninflammatory disorders of vagina: Secondary | ICD-10-CM

## 2014-11-07 DIAGNOSIS — IMO0002 Reserved for concepts with insufficient information to code with codable children: Secondary | ICD-10-CM

## 2014-11-07 LAB — POCT WET PREP (WET MOUNT): CLUE CELLS WET PREP WHIFF POC: NEGATIVE

## 2014-11-07 LAB — POCT GLYCOSYLATED HEMOGLOBIN (HGB A1C)

## 2014-11-07 NOTE — Patient Instructions (Addendum)
Follow up in 2-3 weeks  Will refer to Dr Jenne Campus for nutrition teaching PLEASE take Metformin twice daily and glipizide daily! Will refer to Ophthalmology once blood sugars are better controlled Expect yeast vaginitis to improve with improved blood sugar control

## 2014-11-07 NOTE — Assessment & Plan Note (Signed)
Likely secondary to elevated blood sugars Will defer treatment as best way to treat will be to get blood sugars controlled

## 2014-11-07 NOTE — Progress Notes (Signed)
   Subjective:    Patient ID: [name], female    DOB: [birth year], 39 y.o.   MRN: [id number]   CC: Blurry vision, Vaginal itching  HPI:  39 y/o F with PMH sig for DM and HTN presenting for blurry vision, vaginal itching  DM: had not been checking her blood sugars since she says her insurance will not cover blood sugar testing supplies. A1c today >15. Currently on metformin 500 BID, glipizide 10mg    Yeasty infection - Has previously been treated with diflucan x2 with minimal relief. Endorses continued pruritis  HTN: BP higher than the recomended 130/80 for diabetes. Not currently on therapy   Review of Systems   See HPI for ROS. All other systems reviewed and are negative.  Past medical history, surgical, family, and social history reviewed and updated in the EMR as appropriate.  Past Medical History  Diagnosis Date  . Hidradenitis suppurativa   . Depressive disorder, not elsewhere classified   . Tobacco abuse   . Bipolar 1 disorder   . Obesity   . Gestational diabetes   . Bronchitis    History   Social History  . Marital Status: Single    Spouse Name: N/A  . Number of Children: N/A  . Years of Education: N/A   Occupational History  . Not on file.   Social History Main Topics  . Smoking status: Current Every Day Smoker -- 1.00 packs/day    Types: Cigarettes  . Smokeless tobacco: Not on file  . Alcohol Use: 0.0 oz/week    0 Standard drinks or equivalent per week  . Drug Use: Yes    Special: Marijuana  . Sexual Activity: Not on file   Other Topics Concern  . Not on file   Social History Narrative    Objective:  BP 140/78 mmHg  Pulse 62  Temp(Src) 98.3 F (36.8 C) (Oral)  Ht 5\' 3"  (1.6 m)  Wt 185 lb (83.915 kg)  BMI 32.78 kg/m2  LMP 10/05/2014 (Approximate) Vitals and nursing note reviewed  General: NAD Cardiac: RRR, normal heart sounds, no murmurs. 2+ radial and PT pulses bilaterally Respiratory: CTAB, normal effort Abdomen: soft,  nontender, nondistended, no hepatic or splenomegaly. Bowel sounds present Extremities: no edema or cyanosis. WWP. Skin: warm and dry, no rashes noted Neuro: alert and oriented, no focal deficits   Assessment & Plan:  See Problem List      Jerene Yeager A. Lincoln Brigham MD, [facility] Resident PGY-1 Pager [phone]

## 2014-11-07 NOTE — Assessment & Plan Note (Addendum)
A1c 15 today. She has not been taking her metformin or glipizide for the last month. She reports a "bad diet" and has not exercised for the last week  - She has been strongly counseled to take her medicines regularly.  - She does not need to check her blood sugars at home as she is not on insulin - Counseled to very closely monitor carbs intake - encouraged at least 150 mins of exercise weekly -Referral to Dr Jenne Campus for nutrition education -Ophtho after blood sugar better controlled to assess new baseline eye function   She expressed understanding and acceptance RTC in 2-3 weeks to monitor progress

## 2014-11-08 NOTE — Progress Notes (Signed)
I was preceptor the day of this visit.   

## 2015-07-04 ENCOUNTER — Telehealth: Payer: Self-pay | Admitting: Family Medicine

## 2015-07-04 ENCOUNTER — Emergency Department (HOSPITAL_COMMUNITY)
Admission: EM | Admit: 2015-07-04 | Discharge: 2015-07-04 | Disposition: A | Discharge status: Home / Self Care | Payer: Medicaid Other | Attending: Emergency Medicine | Admitting: Emergency Medicine

## 2015-07-04 ENCOUNTER — Encounter (HOSPITAL_COMMUNITY): Payer: Self-pay | Admitting: Emergency Medicine

## 2015-07-04 ENCOUNTER — Encounter: Payer: Self-pay | Admitting: Family Medicine

## 2015-07-04 ENCOUNTER — Ambulatory Visit (INDEPENDENT_AMBULATORY_CARE_PROVIDER_SITE_OTHER): Payer: Medicaid Other | Admitting: Family Medicine

## 2015-07-04 VITALS — BP 158/97 | HR 105 | Temp 98.6°F | Ht 63.0 in | Wt 172.2 lb

## 2015-07-04 DIAGNOSIS — Z7984 Long term (current) use of oral hypoglycemic drugs: Secondary | ICD-10-CM | POA: Insufficient documentation

## 2015-07-04 DIAGNOSIS — E669 Obesity, unspecified: Secondary | ICD-10-CM | POA: Insufficient documentation

## 2015-07-04 DIAGNOSIS — Z8632 Personal history of gestational diabetes: Secondary | ICD-10-CM | POA: Diagnosis not present

## 2015-07-04 DIAGNOSIS — F312 Bipolar disorder, current episode manic severe with psychotic features: Secondary | ICD-10-CM | POA: Diagnosis not present

## 2015-07-04 DIAGNOSIS — F309 Manic episode, unspecified: Secondary | ICD-10-CM

## 2015-07-04 DIAGNOSIS — F17210 Nicotine dependence, cigarettes, uncomplicated: Secondary | ICD-10-CM | POA: Diagnosis not present

## 2015-07-04 DIAGNOSIS — Z8709 Personal history of other diseases of the respiratory system: Secondary | ICD-10-CM | POA: Diagnosis not present

## 2015-07-04 DIAGNOSIS — F319 Bipolar disorder, unspecified: Secondary | ICD-10-CM

## 2015-07-04 DIAGNOSIS — Z008 Encounter for other general examination: Secondary | ICD-10-CM | POA: Diagnosis present

## 2015-07-04 DIAGNOSIS — Z79899 Other long term (current) drug therapy: Secondary | ICD-10-CM | POA: Insufficient documentation

## 2015-07-04 DIAGNOSIS — E1165 Type 2 diabetes mellitus with hyperglycemia: Secondary | ICD-10-CM

## 2015-07-04 DIAGNOSIS — Z872 Personal history of diseases of the skin and subcutaneous tissue: Secondary | ICD-10-CM | POA: Insufficient documentation

## 2015-07-04 DIAGNOSIS — IMO0002 Reserved for concepts with insufficient information to code with codable children: Secondary | ICD-10-CM

## 2015-07-04 DIAGNOSIS — IMO0001 Reserved for inherently not codable concepts without codable children: Secondary | ICD-10-CM

## 2015-07-04 LAB — CBG MONITORING, ED: GLUCOSE-CAPILLARY: 205 mg/dL — AB (ref 65–99)

## 2015-07-04 LAB — POCT GLYCOSYLATED HEMOGLOBIN (HGB A1C): HEMOGLOBIN A1C: 10

## 2015-07-04 MED ORDER — GLIPIZIDE 10 MG PO TABS: 10.0000 mg | ORAL_TABLET | Freq: Two times a day (BID) | ORAL | Status: DC

## 2015-07-04 MED ORDER — METFORMIN HCL 500 MG PO TABS: 500.0000 mg | ORAL_TABLET | Freq: Two times a day (BID) | ORAL | Status: DC

## 2015-07-04 MED ORDER — ESCITALOPRAM OXALATE 10 MG PO TABS: 10.0000 mg | ORAL_TABLET | Freq: Every day | ORAL | Status: DC

## 2015-07-04 MED ORDER — ALBUTEROL SULFATE HFA 108 (90 BASE) MCG/ACT IN AERS
2.0000 | INHALATION_SPRAY | Freq: Once | RESPIRATORY_TRACT | Status: AC
  Administered 2015-07-04: 2 via RESPIRATORY_TRACT
  Filled 2015-07-04: qty 6.7

## 2015-07-04 MED ORDER — ESCITALOPRAM OXALATE 10 MG PO TABS
10.0000 mg | ORAL_TABLET | ORAL | Status: AC
  Administered 2015-07-04: 10 mg via ORAL
  Filled 2015-07-04: qty 1

## 2015-07-04 MED ORDER — LAMOTRIGINE 200 MG PO TABS: 200.0000 mg | ORAL_TABLET | Freq: Every day | ORAL | Status: DC

## 2015-07-04 MED ORDER — LAMOTRIGINE 200 MG PO TABS
200.0000 mg | ORAL_TABLET | ORAL | Status: AC
  Administered 2015-07-04: 200 mg via ORAL
  Filled 2015-07-04: qty 1

## 2015-07-04 NOTE — Telephone Encounter (Signed)
Attempted to reach patient, as I noticed she did not actually go to the ER yet today. No answer by phone. Did not leave VM.  Leeanne Rio, MD

## 2015-07-04 NOTE — Patient Instructions (Signed)
Please go to the ER at [facility] as soon as you get your items for the weekend in order. I'm sorry you've been feeling so bad. If anything gets worse before you go to the ED, please call 911 to stay safe. Schedule a visit here after the hospital, to talk about diabetes and the other medical problems.  Be well, Dr. Ardelia Mems

## 2015-07-04 NOTE — ED Provider Notes (Signed)
CSN: [id number]     Arrival date & time 07/04/15  1935 History   First MD Initiated Contact with Patient 07/04/15 2002     Chief Complaint  Patient presents with  . Medical Clearance     (Consider location/radiation/quality/duration/timing/severity/associated sxs/prior Treatment) The history is provided by the patient and a relative.     Pt with hx bipolar disorder presents with episode of mania with psychotic features.  Sent by PCP office.  Pt states she is feeling paranoid, feels that conversations that are happening across the ER involve her, feels she is involved in conversations with the TV and radio.  She is feeling happy and believes she is in a manic phase but also feels she can control it if she just starts taking her medication again.  She has, in the past, been angry and violent in manic episodes and she does not want this to switch into that, which she insists would likely happen if she stays in the ED or is admitted, given her experiences here in the past.  Her sister is with her and feels comfortable with the idea of patient leaving and getting a hotel room tonight.  She feels this is a safe plan.  Patient denies SI, HI.  States she feels safe.  She is aware that the hallucinations and paranoia are not normal.  Repeats "I know how to handle this.  I just need my medication."  Has taken lamictal and lexapro in the past.  She and sister are aware that it will take a long time for the medication to get into her system and make any actual change.  Patient intends to follow up with her psychiatrist tomorrow.    Past Medical History  Diagnosis Date  . Hidradenitis suppurativa   . Depressive disorder, not elsewhere classified   . Tobacco abuse   . Bipolar 1 disorder ([place])   . Obesity   . Gestational diabetes   . Bronchitis    Past Surgical History  Procedure Laterality Date  . Tubal ligation     Family History  Problem Relation Age of Onset  . Hypertension Mother   .  Schizophrenia Mother   . Hypertension Father   . Diabetes Father    Social History  Substance Use Topics  . Smoking status: Current Every Day Smoker -- 1.00 packs/day    Types: Cigarettes  . Smokeless tobacco: None  . Alcohol Use: 0.0 oz/week    0 Standard drinks or equivalent per week   OB History    No data available     Review of Systems  All other systems reviewed and are negative.     Allergies  Review of patient's allergies indicates no known allergies.  Home Medications   Prior to Admission medications   Medication Sig Start Date End Date Taking? Authorizing Provider  aspirin-sod bicarb-citric acid (ALKA-SELTZER) 325 MG TBEF tablet Take 325 mg by mouth every 6 (six) hours as needed (congestion).   Yes Historical Provider, MD  cyclobenzaprine (FLEXERIL) 5 MG tablet Take 5 mg by mouth 3 (three) times daily as needed for muscle spasms.   Yes Historical Provider, MD  dextromethorphan-guaiFENesin (MUCINEX DM) 30-600 MG 12hr tablet Take 1 tablet by mouth 2 (two) times daily as needed for cough.   Yes Historical Provider, MD  escitalopram (LEXAPRO) 10 MG tablet Take 10 mg by mouth daily.   Yes Lillia Corporal, MD  glipiZIDE (GLUCOTROL) 10 MG tablet Take 1 tablet (10 mg total) by mouth 2 (  two) times daily before a meal. 05/17/14  Yes Frazier Richards, MD  lamoTRIgine (LAMICTAL) 200 MG tablet Take 200 mg by mouth daily.   Yes Historical Provider, MD  metFORMIN (GLUCOPHAGE) 500 MG tablet Take 1 tablet (500 mg total) by mouth 2 (two) times daily with a meal. 02/22/14  Yes Bernadene Bell, MD  albuterol (PROVENTIL HFA;VENTOLIN HFA) 108 (90 BASE) MCG/ACT inhaler Inhale 2 puffs into the lungs every 6 (six) hours as needed for wheezing or shortness of breath. 02/22/14   Bernadene Bell, MD  cyclobenzaprine (FLEXERIL) 10 MG tablet Take 1 tablet (10 mg total) by mouth 3 (three) times daily as needed for muscle spasms. Patient not taking: Reported on 07/04/2015 10/04/14   Leone Haven, MD   fluconazole (DIFLUCAN) 150 MG tablet Take 1 tablet (150 mg total) by mouth once. Patient not taking: Reported on 07/04/2015 10/22/14   Mariel Aloe, MD  glucose blood (ONE TOUCH TEST STRIPS) test strip Use as instructed 06/18/14   Bernadene Bell, MD  ibuprofen (ADVIL,MOTRIN) 600 MG tablet Take 1 tablet (600 mg total) by mouth every 8 (eight) hours as needed. Patient not taking: Reported on 07/04/2015 10/04/14   Leone Haven, MD  Lancets [facility] ULTRASOFT) lancets Use as instructed. 06/18/14   Bernadene Bell, MD  pravastatin (PRAVACHOL) 20 MG tablet Take 1 tablet (20 mg total) by mouth daily. Patient not taking: Reported on 07/04/2015 02/22/14   Bernadene Bell, MD   BP 175/102 mmHg  Pulse 106  Temp(Src)   Resp 20  SpO2 100% Physical Exam  Constitutional: She appears well-developed and well-nourished. No distress.  HENT:  Head: Normocephalic and atraumatic.  Neck: Neck supple.  Cardiovascular: Normal rate and regular rhythm.   Pulmonary/Chest: Effort normal and breath sounds normal. No respiratory distress. She has no wheezes. She has no rales.  Abdominal: Soft. She exhibits no distension. There is no tenderness. There is no rebound and no guarding.  Neurological: She is alert.  Skin: She is not diaphoretic.  Psychiatric: Her speech is normal. She is actively hallucinating. She is not agitated, not aggressive, not hyperactive, not slowed, not withdrawn and not combative. She expresses no homicidal and no suicidal ideation.  Pt speaks in a calm, controlled voice.  Makes eye contact.  Goal directed speech. Expressed insight into disease process.   She is attentive.  Nursing note and vitals reviewed.   ED Course  Procedures (including critical care time) Labs Review Labs Reviewed  CBG MONITORING, ED - Abnormal; Notable for the following:    Glucose-Capillary 205 (*)    All other components within normal limits    Imaging Review No results found. I have personally reviewed  and evaluated these images and lab results as part of my medical decision-making.   EKG Interpretation None         MDM   Final diagnoses:  Bipolar disorder with psychotic features ([place])    Pt with hx bipolar disorder, currently off her medications, requesting to restart her medication.  Pt is having hallucinations but has good insight into this. She does not seem to be acting out or physically involved in these conversations she reports.  Has signed no harm contract with PCP.  She is not suicidal or homicidal and is here voluntarily.  She denies that any of the voices are commanding her to do anything or are instructing her at all.  She feels safe.  Her sister is with her and also feels  that patient is safe for discharge and does not need to be admitted.  She will help patient find a place to stay tonight (hotel is the plan) because pt is currently fighting with her significant other.  I have given patient dose of her home medication in ED and d/c her home with 1 week prescription of psych medications and diabetes medications (at patient's request). I have no reason to involuntarily commit the patient and given her refusal to stay /request to be discharged I have strongly encouraged her to follow up with her psychiatrist tomorrow and have invited her to return for any worsening or concerning symptoms.  Discussed treatment, and follow up  with patient.  Pt given return precautions.  Pt verbalizes understanding and agrees with plan.       Clayton Bibles, PA-C 07/04/15 2151  Harvel Quale, MD 07/05/15 [phone]

## 2015-07-04 NOTE — ED Notes (Signed)
Patient presents for medical clearance, sent by PCP. Denies SI/HI, endorses A/VH. Reports "I feel the tv and radio are talking to me". Hallucinations started yesterday. Hx of bipolar. Reports added stressors.

## 2015-07-04 NOTE — Progress Notes (Signed)
Date of Visit: 07/04/2015   HPI:  Patient presents for a same day appointment to discuss diabetes, medication refills, and possible manic episode.   She reports a history of being diagnosed with bipolar disorder in the past. Previously was treated by [facility], but has not been seen there in many months. Was on lamictal and lexapro but has been out of these medications for some time. Reports she has not been doing well over the last couple of weeks, believes herself to be manic. Endorses paranoid ideations and hallucinations. Has been hearing voices, and also has been having conversations with the TV and radio, as if she knows they are talking with her. Usually when she is manic she feels aggressive and combative, but this time she has been feeling joyful. Danced last night at home until she got sweaty. Thinks she's slept about 7-8 hours total in the last 4 days. Mother recently had a stroke in [place] and she went to visit her for that. Her mother has a history of paranoid schizophrenia.  Patient denies any current suicidality or thoughts of harming others. Endorses the occasional brief suicidal thought which she quickly pushes away, knowing she has children. Denies any plans to harm herself. Has never attempted to hurt herself in the past. Does have a history of incarceration during a manic episode. Has been hospitalized in the past for mental health issues. She feels as though she needs hospitalization now. Thinks she needs a "break" and that hospitalization would be a good option.  Regarding her diabetes, has not taken medications in some time. Needs inhaler filled for her asthma as she lost her inhaler. Body feels tense a lot. Previously was on metformin and glipizide.   ROS: See HPI.  [place]: history of obesity, hidradenitis, bipolar I disorder, hyperlipidemia, type 2 diabetes  PHYSICAL EXAM: BP 158/97 mmHg  Pulse 105  Temp(Src) 98.6 F (37 C) (Oral)  Ht 5\' 3"  (1.6 m)  Wt 172  lb 3.2 oz (78.109 kg)  BMI 30.51 kg/m2 Gen: no acute physical distress. Tearful at times. HEENT: NCAT Psych: well groomed. Affect is primarily sad, but shows full range. Mood is both depressed and joyful, also stressed. Thought process is logical, linear, and goal directed. Good insight and judgment. No SI/HI. +auditory hallucination and ideas of reference. Does not appear to be responding to internal stimuli presently. Normal eye contact. Speech normal in rate and volume. No psychomotor agitation.  Alert and oriented.  ASSESSMENT/PLAN:  39 yo F with history of bipolar disorder, presenting with likely acute mania with psychotic features. Given report of joyful mood, decreased sleep, auditory hallucinations, and ideas of reference, believe this likely represents a manic episode. Patient presently has very good insight into her condition, self-identifying that she is hallucinating and that she has been talking with the TV and radio, knowing that these are normal. She vocalizes the need for hospitalization and is willing to seek care in the ER to start the hospitalization process. She does have an 33 year old son at home, and needs to make sure he is cared for before she is able to go to the ED. She would like to go home first, in order to collect a few things including clothes for herself, and take some clothes for her son to her cousin's house prior to going to the ED. As she appears to have good insight and judgment, is thinking clearly, does NOT have any suicidal or homicidal ideation, and has no history of  self harm in the past, I am comfortable with her going home first to make these arrangements. She will self-present to the ED at [facility] within two hours of leaving our clinic. She contracted for safety and agreed to call 911 should anything get worse before she is able to go to the ED.  Her diabetes and other medical issues can be addressed when she is more psychiatrically stable. A1c  checked today and was improved at 10, previously >15.  FOLLOW UP: Going to ED for psych evaluation.  [place]. Ardelia Mems, [place]

## 2015-07-04 NOTE — Discharge Instructions (Signed)
Read the information below.  Use the prescribed medication as directed.  Please discuss all new medications with your pharmacist.  You may return to the Emergency Department at any time for worsening condition or any new symptoms that concern you.   Please call 911 or return to the ER at any time if you feel like you might be at risk of harming yourself or someone else, of if you feel your symptoms are out of control.  Please follow up first thing tomorrow morning with your psychiatrist and take you medication only as directed.    Bipolar Disorder Bipolar disorder is a mental illness. The term bipolar disorder actually is used to describe a group of disorders that all share varying degrees of emotional highs and lows that can interfere with daily functioning, such as work, school, or relationships. Bipolar disorder also can lead to drug abuse, hospitalization, and suicide. The emotional highs of bipolar disorder are periods of elation or irritability and high energy. These highs can range from a mild form (hypomania) to a severe form (mania). People experiencing episodes of hypomania may appear energetic, excitable, and highly productive. People experiencing mania may behave impulsively or erratically. They often make poor decisions. They may have difficulty sleeping. The most severe episodes of mania can involve having very distorted beliefs or perceptions about the world and seeing or hearing things that are not real (psychotic delusions and hallucinations).  The emotional lows of bipolar disorder (depression) also can range from mild to severe. Severe episodes of bipolar depression can involve psychotic delusions and hallucinations. Sometimes people with bipolar disorder experience a state of mixed mood. Symptoms of hypomania or mania and depression are both present during this mixed-mood episode. SIGNS AND SYMPTOMS There are signs and symptoms of the episodes of hypomania and mania as well as the  episodes of depression. The signs and symptoms of hypomania and mania are similar but vary in severity. They include:  Inflated self-esteem or feeling of increased self-confidence.  Decreased need for sleep.  Unusual talkativeness (rapid or pressured speech) or the feeling of a need to keep talking.  Sensation of racing thoughts or constant talking, with quick shifts between topics that may or may not be related (flight of ideas).  Decreased ability to focus or concentrate.  Increased purposeful activity, such as work, studies, or social activity, or nonproductive activity, such as pacing, squirming and fidgeting, or finger and toe tapping.  Impulsive behavior and use of poor judgment, resulting in high-risk activities, such as having unprotected sex or spending excessive amounts of money. Signs and symptoms of depression include the following:   Feelings of sadness, hopelessness, or helplessness.  Frequent or uncontrollable episodes of crying.  Lack of feeling anything or caring about anything.  Difficulty sleeping or sleeping too much.  Inability to enjoy the things you used to enjoy.   Desire to be alone all the time.   Feelings of guilt or worthlessness.  Lack of energy or motivation.   Difficulty concentrating, remembering, or making decisions.  Change in appetite or weight beyond normal fluctuations.  Thoughts of death or the desire to harm yourself. DIAGNOSIS  Bipolar disorder is diagnosed through an assessment by your caregiver. Your caregiver will ask questions about your emotional episodes. There are two main types of bipolar disorder. People with type I bipolar disorder have manic episodes with or without depressive episodes. People with type II bipolar disorder have hypomanic episodes and major depressive episodes, which are more serious than mild  depression. The type of bipolar disorder you have can make an important difference in how your illness is  monitored and treated. Your caregiver may ask questions about your medical history and use of alcohol or drugs, including prescription medication. Certain medical conditions and substances also can cause emotional highs and lows that resemble bipolar disorder (secondary bipolar disorder).  TREATMENT  Bipolar disorder is a long-term illness. It is best controlled with continuous treatment rather than treatment only when symptoms occur. The following treatments can be prescribed for bipolar disorders:  Medication--Medication can be prescribed by a doctor that is an expert in treating mental disorders (psychiatrists). Medications called mood stabilizers are usually prescribed to help control the illness. Other medications are sometimes added if symptoms of mania, depression, or psychotic delusions and hallucinations occur despite the use of a mood stabilizer.  Talk therapy--Some forms of talk therapy are helpful in providing support, education, and guidance. A combination of medication and talk therapy is best for managing the disorder over time. A procedure in which electricity is applied to your brain through your scalp (electroconvulsive therapy) is used in cases of severe mania when medication and talk therapy do not work or work too slowly.   This information is not intended to replace advice given to you by your health care provider. Make sure you discuss any questions you have with your health care provider.   Document Released: 10/25/2000 Document Revised: 08/09/2014 Document Reviewed: 08/14/2012 Elsevier Interactive Patient Education [employer].

## 2015-07-07 ENCOUNTER — Emergency Department (HOSPITAL_COMMUNITY)
Admission: EM | Admit: 2015-07-07 | Discharge: 2015-07-07 | Disposition: A | Discharge status: Home / Self Care | Payer: Medicaid Other | Attending: Emergency Medicine | Admitting: Emergency Medicine

## 2015-07-07 ENCOUNTER — Encounter (HOSPITAL_COMMUNITY): Payer: Self-pay | Admitting: Emergency Medicine

## 2015-07-07 DIAGNOSIS — R4689 Other symptoms and signs involving appearance and behavior: Secondary | ICD-10-CM

## 2015-07-07 DIAGNOSIS — E669 Obesity, unspecified: Secondary | ICD-10-CM | POA: Diagnosis not present

## 2015-07-07 DIAGNOSIS — F912 Conduct disorder, adolescent-onset type: Secondary | ICD-10-CM | POA: Insufficient documentation

## 2015-07-07 DIAGNOSIS — F17210 Nicotine dependence, cigarettes, uncomplicated: Secondary | ICD-10-CM | POA: Insufficient documentation

## 2015-07-07 DIAGNOSIS — F911 Conduct disorder, childhood-onset type: Secondary | ICD-10-CM | POA: Diagnosis present
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
## 2015-07-07 NOTE — Discharge Instructions (Signed)
Anger Management °Anger is a normal human emotion. However, anger can range from mild irritation to rage. When your anger becomes harmful to yourself or others, it is unhealthy anger.  °CAUSES  °There are many reasons for unhealthy anger. Many people learn how to express anger from observing how their family expressed anger. In troubled, chaotic, or abusive families, anger can be expressed as rage or even violence. Children can grow up never learning how healthy anger can be expressed. Factors that contribute to unhealthy anger include:  °· Drug or alcohol abuse. °· Post-traumatic stress disorder. °· Traumatic brain injury. °COMPLICATIONS  °People with unhealthy anger tend to overreact and retaliate against a real or imagined threat. The need to retaliate can turn into violence or verbal abuse against another person. Chronic anger can lead to health problems, such as hypertension, high blood pressure, and depression. °TREATMENT  °Exercising, relaxing, meditating, or writing out your feelings all can be beneficial in managing moderate anger. For unhealthy anger, the following methods may be used: °· Cognitive-behavioral counseling (learning skills to change the thoughts that influence your mood). °· Relaxation training. °· Interpersonal counseling. °· Assertive communication skills. °· Medication. °  °This information is not intended to replace advice given to you by your health care provider. Make sure you discuss any questions you have with your health care provider. °  °Document Released: 05/16/2007 Document Revised: 10/11/2011 Document Reviewed: 09/24/2010 °Elsevier Interactive Patient Education ©2016 Elsevier Inc. ° °

## 2015-07-19 ENCOUNTER — Other Ambulatory Visit: Payer: Self-pay | Admitting: Family Medicine

## 2015-07-21 ENCOUNTER — Other Ambulatory Visit: Payer: Self-pay | Admitting: *Deleted

## 2015-07-21 DIAGNOSIS — E1165 Type 2 diabetes mellitus with hyperglycemia: Principal | ICD-10-CM

## 2015-07-21 DIAGNOSIS — IMO0001 Reserved for inherently not codable concepts without codable children: Secondary | ICD-10-CM

## 2015-07-22 MED ORDER — GLUCOSE BLOOD VI STRP: ORAL_STRIP | Status: DC

## 2015-07-22 NOTE — Telephone Encounter (Signed)
Test strips ordered.

## 2015-07-22 NOTE — Telephone Encounter (Signed)
Diabetes supplies odered

## 2015-07-23 ENCOUNTER — Other Ambulatory Visit: Payer: Self-pay | Admitting: *Deleted

## 2015-07-23 NOTE — Telephone Encounter (Signed)
Patient also requested DM supplies.  Patient has medicaid and preferred is Accu-Chek brand.  DM standardized form completed for Meter, test strips, lancets and lancet device; sign by Dr. Ardelia Mems. Form faxed to [facility].  Derl Barrow, RN

## 2015-07-25 MED ORDER — ESCITALOPRAM OXALATE 10 MG PO TABS: 10.0000 mg | ORAL_TABLET | Freq: Every day | ORAL | Status: DC

## 2015-07-25 MED ORDER — LAMOTRIGINE 200 MG PO TABS: 200.0000 mg | ORAL_TABLET | Freq: Every day | ORAL | Status: DC

## 2015-07-25 NOTE — Telephone Encounter (Signed)
Lamictal and lexapro refilled

## 2015-08-12 ENCOUNTER — Emergency Department (HOSPITAL_COMMUNITY)
Admission: EM | Admit: 2015-08-12 | Discharge: 2015-08-13 | Disposition: A | Discharge status: Other Acute Inpatient Hospital | Payer: Medicaid Other | Attending: Emergency Medicine | Admitting: Emergency Medicine

## 2015-08-12 ENCOUNTER — Ambulatory Visit (HOSPITAL_COMMUNITY)
Admission: AD | Admit: 2015-08-12 | Discharge: 2015-08-12 | Disposition: A | Discharge status: Home / Self Care | Payer: Medicaid Other | Attending: Psychiatry | Admitting: Psychiatry

## 2015-08-12 DIAGNOSIS — Z3202 Encounter for pregnancy test, result negative: Secondary | ICD-10-CM | POA: Insufficient documentation

## 2015-08-12 DIAGNOSIS — F17210 Nicotine dependence, cigarettes, uncomplicated: Secondary | ICD-10-CM | POA: Diagnosis not present

## 2015-08-12 DIAGNOSIS — Z8632 Personal history of gestational diabetes: Secondary | ICD-10-CM | POA: Diagnosis not present

## 2015-08-12 DIAGNOSIS — F319 Bipolar disorder, unspecified: Secondary | ICD-10-CM | POA: Diagnosis present

## 2015-08-12 DIAGNOSIS — Z872 Personal history of diseases of the skin and subcutaneous tissue: Secondary | ICD-10-CM | POA: Insufficient documentation

## 2015-08-12 DIAGNOSIS — F3163 Bipolar disorder, current episode mixed, severe, without psychotic features: Secondary | ICD-10-CM

## 2015-08-12 DIAGNOSIS — F250 Schizoaffective disorder, bipolar type: Secondary | ICD-10-CM | POA: Diagnosis not present

## 2015-08-12 DIAGNOSIS — Z79899 Other long term (current) drug therapy: Secondary | ICD-10-CM | POA: Diagnosis not present

## 2015-08-12 DIAGNOSIS — F1220 Cannabis dependence, uncomplicated: Secondary | ICD-10-CM | POA: Diagnosis present

## 2015-08-12 DIAGNOSIS — E669 Obesity, unspecified: Secondary | ICD-10-CM | POA: Diagnosis not present

## 2015-08-12 DIAGNOSIS — F1210 Cannabis abuse, uncomplicated: Secondary | ICD-10-CM | POA: Insufficient documentation

## 2015-08-12 LAB — RAPID URINE DRUG SCREEN, HOSP PERFORMED
Amphetamines: NOT DETECTED
BARBITURATES: NOT DETECTED
Benzodiazepines: NOT DETECTED
COCAINE: NOT DETECTED
Opiates: NOT DETECTED
TETRAHYDROCANNABINOL: POSITIVE — AB

## 2015-08-12 LAB — COMPREHENSIVE METABOLIC PANEL
ALT: 12 U/L — ABNORMAL LOW (ref 14–54)
AST: 17 U/L (ref 15–41)
Albumin: 4.7 g/dL (ref 3.5–5.0)
Alkaline Phosphatase: 58 U/L (ref 38–126)
Anion gap: 14 (ref 5–15)
BUN: 11 mg/dL (ref 6–20)
CALCIUM: 9.2 mg/dL (ref 8.9–10.3)
CHLORIDE: 99 mmol/L — AB (ref 101–111)
CO2: 21 mmol/L — AB (ref 22–32)
CREATININE: 0.85 mg/dL (ref 0.44–1.00)
GLUCOSE: 251 mg/dL — AB (ref 65–99)
POTASSIUM: 3.3 mmol/L — AB (ref 3.5–5.1)
SODIUM: 134 mmol/L — AB (ref 135–145)
TOTAL PROTEIN: 8.5 g/dL — AB (ref 6.5–8.1)
Total Bilirubin: 0.9 mg/dL (ref 0.3–1.2)

## 2015-08-12 LAB — ETHANOL: Alcohol, Ethyl (B): <5 mg/dL (ref <5)

## 2015-08-12 LAB — CBC
HEMATOCRIT: 39.2 % (ref 36.0–46.0)
HEMOGLOBIN: 14 g/dL (ref 12.0–15.0)
MCH: 27.1 pg (ref 26.0–34.0)
MCHC: 35.7 g/dL (ref 30.0–36.0)
MCV: 76 fL — ABNORMAL LOW (ref 78.0–100.0)
Platelets: 506 10*3/uL — ABNORMAL HIGH (ref 150–400)
RBC: 5.16 MIL/uL — ABNORMAL HIGH (ref 3.87–5.11)
RDW: 14.5 % (ref 11.5–15.5)
WBC: 12.9 10*3/uL — ABNORMAL HIGH (ref 4.0–10.5)

## 2015-08-12 LAB — CBG MONITORING, ED: GLUCOSE-CAPILLARY: 98 mg/dL (ref 65–99)

## 2015-08-12 LAB — ACETAMINOPHEN LEVEL

## 2015-08-12 LAB — SALICYLATE LEVEL

## 2015-08-12 LAB — POC URINE PREG, ED: Preg Test, Ur: NEGATIVE

## 2015-08-12 MED ORDER — LORAZEPAM 2 MG/ML IJ SOLN
2.0000 mg | Freq: Once | INTRAMUSCULAR | Status: AC
  Administered 2015-08-13: 2 mg via INTRAMUSCULAR
  Filled 2015-08-12: qty 1

## 2015-08-12 MED ORDER — ESCITALOPRAM OXALATE 10 MG PO TABS
20.0000 mg | ORAL_TABLET | Freq: Every day | ORAL | Status: DC
  Administered 2015-08-12 – 2015-08-13 (×2): 20 mg via ORAL
  Filled 2015-08-12 (×2): qty 2

## 2015-08-12 MED ORDER — PRAVASTATIN SODIUM 20 MG PO TABS
20.0000 mg | ORAL_TABLET | Freq: Every day | ORAL | Status: DC
  Administered 2015-08-12 – 2015-08-13 (×2): 20 mg via ORAL
  Filled 2015-08-12 (×2): qty 1

## 2015-08-12 MED ORDER — LEVONORGEST-ETH ESTRAD 91-DAY 0.15-0.03 MG PO TABS
1.0000 | ORAL_TABLET | Freq: Every day | ORAL | Status: DC
  Filled 2015-08-12 (×2): qty 1

## 2015-08-12 MED ORDER — DIPHENHYDRAMINE HCL 50 MG/ML IJ SOLN
50.0000 mg | Freq: Once | INTRAMUSCULAR | Status: AC
  Administered 2015-08-13: 50 mg via INTRAMUSCULAR
  Filled 2015-08-12: qty 1

## 2015-08-12 MED ORDER — LAMOTRIGINE 200 MG PO TABS
200.0000 mg | ORAL_TABLET | Freq: Every day | ORAL | Status: DC
  Administered 2015-08-12 – 2015-08-13 (×2): 200 mg via ORAL
  Filled 2015-08-12 (×2): qty 1

## 2015-08-12 MED ORDER — ALBUTEROL SULFATE HFA 108 (90 BASE) MCG/ACT IN AERS: 2.0000 | INHALATION_SPRAY | Freq: Four times a day (QID) | RESPIRATORY_TRACT | Status: DC | PRN

## 2015-08-12 MED ORDER — STERILE WATER FOR INJECTION IJ SOLN
INTRAMUSCULAR | Status: AC
  Administered 2015-08-13: 1.2 mL via INTRAMUSCULAR
  Filled 2015-08-12: qty 10

## 2015-08-12 MED ORDER — IBUPROFEN 200 MG PO TABS: 600.0000 mg | ORAL_TABLET | Freq: Four times a day (QID) | ORAL | Status: DC | PRN

## 2015-08-12 MED ORDER — GLIPIZIDE 10 MG PO TABS
10.0000 mg | ORAL_TABLET | Freq: Two times a day (BID) | ORAL | Status: DC
  Administered 2015-08-12 – 2015-08-13 (×2): 10 mg via ORAL
  Filled 2015-08-12 (×4): qty 1

## 2015-08-12 MED ORDER — METFORMIN HCL 500 MG PO TABS
500.0000 mg | ORAL_TABLET | Freq: Two times a day (BID) | ORAL | Status: DC
Start: 2015-08-12 — End: 2015-08-13
  Administered 2015-08-12 – 2015-08-13 (×2): 500 mg via ORAL
  Filled 2015-08-12 (×4): qty 1

## 2015-08-12 MED ORDER — ZIPRASIDONE MESYLATE 20 MG IM SOLR
20.0000 mg | Freq: Once | INTRAMUSCULAR | Status: AC
  Administered 2015-08-13: 20 mg via INTRAMUSCULAR
  Filled 2015-08-12: qty 20

## 2015-08-12 NOTE — ED Notes (Signed)
Pt sleeping at present, no distress noted, easily arouseable to verbal stimuli.  Monitoring for safety, Q 15 min checks in effect. 

## 2015-08-12 NOTE — ED Notes (Signed)
Patients contacts are husband, [name] at [phone] and son [name] [phone]) at [phone].  Son states that she really needs to be hospitalized for a few days.  States she has not been well.
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
## 2015-08-12 NOTE — ED Notes (Signed)
Bed: WTR6 Expected date:  Expected time:  Means of arrival:  Comments: psych

## 2015-08-12 NOTE — ED Notes (Signed)
Charge called [place], 2 children are with pts boyfriend.

## 2015-08-12 NOTE — ED Provider Notes (Signed)
CSN: [id number]     Arrival date & time 08/12/15  69 History   First MD Initiated Contact with Patient 08/12/15 1647     Chief Complaint  Patient presents with  . Manic    HPI Patient presents to the emergency room after briefly being evaluated at [facility] behavioral health. He walked into [facility] to be evaluated because of increasing trouble with her bipolar disorder. Patient has been delusional recently. She has been concerned that people are out to get her in her children. They're in the process of evaluating the patient when it seems that the patient left here at [facility]. Patient walked into the was along the emergency department. While in the emergency department the patient started running around the ED screaming her children's names.  She was agitated and disruptive. Security had to restrain the patient and bring her into the triage area. Patient is now calm and admits she has not been taking her medications recently. She denies any trouble with suicidal or homicidal ideation. Past Medical History  Diagnosis Date  . Hidradenitis suppurativa   . Depressive disorder, not elsewhere classified   . Tobacco abuse   . Bipolar 1 disorder ([place])   . Obesity   . Gestational diabetes   . Bronchitis    Past Surgical History  Procedure Laterality Date  . Tubal ligation     Family History  Problem Relation Age of Onset  . Hypertension Mother   . Schizophrenia Mother   . Hypertension Father   . Diabetes Father    Social History  Substance Use Topics  . Smoking status: Current Every Day Smoker -- 1.00 packs/day    Types: Cigarettes  . Smokeless tobacco: Not on file  . Alcohol Use: 0.0 oz/week    0 Standard drinks or equivalent per week   OB History    No data available     Review of Systems  All other systems reviewed and are negative.     Allergies  Review of patient's allergies indicates no known allergies.  Home Medications   Prior to Admission  medications   Medication Sig Start Date End Date Taking? Authorizing Provider  albuterol (PROVENTIL HFA;VENTOLIN HFA) 108 (90 BASE) MCG/ACT inhaler Inhale 2 puffs into the lungs every 6 (six) hours as needed for wheezing or shortness of breath. 02/22/14  Yes Bernadene Bell, MD  aspirin-sod bicarb-citric acid (ALKA-SELTZER) 325 MG TBEF tablet Take 325 mg by mouth every 6 (six) hours as needed (congestion).    Historical Provider, MD  cyclobenzaprine (FLEXERIL) 10 MG tablet Take 1 tablet (10 mg total) by mouth 3 (three) times daily as needed for muscle spasms. 10/04/14   Leone Haven, MD  dextromethorphan-guaiFENesin [facility] DM) 30-600 MG 12hr tablet Take 1 tablet by mouth 2 (two) times daily as needed for cough.    Historical Provider, MD  escitalopram (LEXAPRO) 10 MG tablet Take 1 tablet (10 mg total) by mouth daily. 07/25/15   Alyssa A Lincoln Brigham, MD  glipiZIDE (GLUCOTROL) 10 MG tablet Take 1 tablet (10 mg total) by mouth 2 (two) times daily before a meal. 07/04/15   Clayton Bibles, PA-C  glucose blood (ONE TOUCH TEST STRIPS) test strip Use as instructed 07/22/15   Veatrice Bourbon, MD  ibuprofen (ADVIL,MOTRIN) 600 MG tablet Take 1 tablet (600 mg total) by mouth every 8 (eight) hours as needed. Patient taking differently: Take 600 mg by mouth every 8 (eight) hours as needed for moderate pain.  10/04/14  Leone Haven, MD  lamoTRIgine (LAMICTAL) 200 MG tablet Take 1 tablet (200 mg total) by mouth daily. 07/25/15   Veatrice Bourbon, MD  Lancets (ONETOUCH ULTRASOFT) lancets Use as instructed. 06/18/14   Bernadene Bell, MD  Lancets [facility] ULTRASOFT) lancets USE AS INSTRUCTED. 07/22/15   Veatrice Bourbon, MD  metFORMIN (GLUCOPHAGE) 500 MG tablet Take 1 tablet (500 mg total) by mouth 2 (two) times daily with a meal. 07/04/15   Clayton Bibles, PA-C  pravastatin (PRAVACHOL) 20 MG tablet Take 1 tablet (20 mg total) by mouth daily. Patient not taking: Reported on 07/04/2015 02/22/14   Bernadene Bell, MD   BP 158/109  mmHg  Pulse 106  Temp(Src) 98.5 F (36.9 C) (Oral)  SpO2 96% Physical Exam  Constitutional: She appears well-developed and well-nourished. No distress.  HENT:  Head: Normocephalic and atraumatic.  Right Ear: External ear normal.  Left Ear: External ear normal.  Eyes: Conjunctivae are normal. Right eye exhibits no discharge. Left eye exhibits no discharge. No scleral icterus.  Neck: Neck supple. No tracheal deviation present.  Cardiovascular: Normal rate, regular rhythm and intact distal pulses.   Pulmonary/Chest: Effort normal and breath sounds normal. No stridor. No respiratory distress. She has no wheezes. She has no rales.  Abdominal: Soft. Bowel sounds are normal. She exhibits no distension. There is no tenderness. There is no rebound and no guarding.  Musculoskeletal: She exhibits no edema or tenderness.  Neurological: She is alert. She has normal strength. No cranial nerve deficit (no facial droop, extraocular movements intact, no slurred speech) or sensory deficit. She exhibits normal muscle tone. She displays no seizure activity. Coordination normal.  Skin: Skin is warm and dry. No rash noted.  Psychiatric: Her affect is labile. Her affect is not angry. Her speech is not rapid and/or pressured. She is withdrawn. She expresses no homicidal and no suicidal ideation.  Nursing note and vitals reviewed.   ED Course  Procedures (including critical care time) Labs Review Labs Reviewed  CBC - Abnormal; Notable for the following:    WBC 12.9 (*)    RBC 5.16 (*)    MCV 76.0 (*)    Platelets 506 (*)    All other components within normal limits  COMPREHENSIVE METABOLIC PANEL  ETHANOL  SALICYLATE LEVEL  ACETAMINOPHEN LEVEL  URINE RAPID DRUG SCREEN, HOSP PERFORMED  POC URINE PREG, ED     MDM   1651  patient is now calm.  IVC paperwork filled since she was very agitated and disruptive and combative just a short time ago.  Labs reviewed.  No acute issues.   Hyperglycemic but she  can continue her oral regimen. Pt is medically stable for psych evaluation.    Dorie Rank, MD 08/12/15 [phone]

## 2015-08-12 NOTE — ED Notes (Signed)
Patient came back to the unit ambulatory.  She was oriented to the unit and her surroundings.  She was given a cold drink and her son was allowed to come back and visit for 30 minutes.  He is very supportive and concerned about her recent behaviors.

## 2015-08-13 ENCOUNTER — Inpatient Hospital Stay (HOSPITAL_COMMUNITY)
Admission: AD | Admit: 2015-08-13 | Discharge: 2015-08-16 | DRG: 885 | Disposition: A | Discharge status: Home / Self Care | Payer: Medicaid Other | Attending: Psychiatry | Admitting: Psychiatry

## 2015-08-13 ENCOUNTER — Encounter (HOSPITAL_COMMUNITY): Payer: Self-pay

## 2015-08-13 DIAGNOSIS — F3163 Bipolar disorder, current episode mixed, severe, without psychotic features: Principal | ICD-10-CM | POA: Diagnosis present

## 2015-08-13 DIAGNOSIS — F17210 Nicotine dependence, cigarettes, uncomplicated: Secondary | ICD-10-CM | POA: Diagnosis not present

## 2015-08-13 DIAGNOSIS — F250 Schizoaffective disorder, bipolar type: Secondary | ICD-10-CM | POA: Diagnosis present

## 2015-08-13 DIAGNOSIS — E119 Type 2 diabetes mellitus without complications: Secondary | ICD-10-CM | POA: Diagnosis not present

## 2015-08-13 DIAGNOSIS — F1220 Cannabis dependence, uncomplicated: Secondary | ICD-10-CM

## 2015-08-13 DIAGNOSIS — F259 Schizoaffective disorder, unspecified: Secondary | ICD-10-CM | POA: Diagnosis present

## 2015-08-13 DIAGNOSIS — F3162 Bipolar disorder, current episode mixed, moderate: Secondary | ICD-10-CM | POA: Diagnosis present

## 2015-08-13 DIAGNOSIS — F29 Unspecified psychosis not due to a substance or known physiological condition: Secondary | ICD-10-CM | POA: Diagnosis present

## 2015-08-13 DIAGNOSIS — E669 Obesity, unspecified: Secondary | ICD-10-CM

## 2015-08-13 DIAGNOSIS — F319 Bipolar disorder, unspecified: Secondary | ICD-10-CM | POA: Diagnosis present

## 2015-08-13 LAB — GLUCOSE, CAPILLARY: GLUCOSE-CAPILLARY: 251 mg/dL — AB (ref 65–99)

## 2015-08-13 LAB — CBG MONITORING, ED: GLUCOSE-CAPILLARY: 161 mg/dL — AB (ref 65–99)

## 2015-08-13 MED ORDER — AMANTADINE HCL 100 MG PO CAPS
100.0000 mg | ORAL_CAPSULE | Freq: Two times a day (BID) | ORAL | Status: DC
  Administered 2015-08-13: 100 mg via ORAL
  Filled 2015-08-13 (×2): qty 1

## 2015-08-13 MED ORDER — GLIPIZIDE 10 MG PO TABS
10.0000 mg | ORAL_TABLET | Freq: Two times a day (BID) | ORAL | Status: DC
  Administered 2015-08-14 – 2015-08-16 (×5): 10 mg via ORAL
  Filled 2015-08-13 (×9): qty 1
  Filled 2015-08-13: qty 2

## 2015-08-13 MED ORDER — INSULIN ASPART 100 UNIT/ML ~~LOC~~ SOLN
0.0000 [IU] | Freq: Every day | SUBCUTANEOUS | Status: DC
  Administered 2015-08-13: 3 [IU] via SUBCUTANEOUS

## 2015-08-13 MED ORDER — AMANTADINE HCL 100 MG PO CAPS
100.0000 mg | ORAL_CAPSULE | Freq: Two times a day (BID) | ORAL | Status: DC
  Administered 2015-08-13 – 2015-08-14 (×2): 100 mg via ORAL
  Filled 2015-08-13 (×5): qty 1

## 2015-08-13 MED ORDER — RISPERIDONE 2 MG PO TBDP
2.0000 mg | ORAL_TABLET | Freq: Three times a day (TID) | ORAL | Status: DC | PRN
  Administered 2015-08-13: 2 mg via ORAL
  Filled 2015-08-13: qty 1

## 2015-08-13 MED ORDER — LORAZEPAM 1 MG PO TABS
1.0000 mg | ORAL_TABLET | ORAL | Status: AC | PRN
  Administered 2015-08-15: 1 mg via ORAL
  Filled 2015-08-13: qty 1

## 2015-08-13 MED ORDER — PALIPERIDONE ER 6 MG PO TB24
6.0000 mg | ORAL_TABLET | Freq: Every day | ORAL | Status: DC
  Administered 2015-08-14: 6 mg via ORAL
  Filled 2015-08-13 (×2): qty 1

## 2015-08-13 MED ORDER — PRAVASTATIN SODIUM 20 MG PO TABS
20.0000 mg | ORAL_TABLET | Freq: Every day | ORAL | Status: DC
  Administered 2015-08-14 – 2015-08-16 (×3): 20 mg via ORAL
  Filled 2015-08-13 (×5): qty 1

## 2015-08-13 MED ORDER — IBUPROFEN 600 MG PO TABS: 600.0000 mg | ORAL_TABLET | Freq: Four times a day (QID) | ORAL | Status: DC | PRN

## 2015-08-13 MED ORDER — PALIPERIDONE ER 6 MG PO TB24
6.0000 mg | ORAL_TABLET | Freq: Every day | ORAL | Status: DC
  Administered 2015-08-13: 6 mg via ORAL
  Filled 2015-08-13: qty 1

## 2015-08-13 MED ORDER — LEVONORGEST-ETH ESTRAD 91-DAY 0.15-0.03 MG PO TABS
1.0000 | ORAL_TABLET | Freq: Every day | ORAL | Status: DC
  Administered 2015-08-14 – 2015-08-16 (×3): 1 via ORAL

## 2015-08-13 MED ORDER — LAMOTRIGINE 200 MG PO TABS
200.0000 mg | ORAL_TABLET | Freq: Every day | ORAL | Status: DC
  Administered 2015-08-14 – 2015-08-16 (×3): 200 mg via ORAL
  Filled 2015-08-13 (×5): qty 1

## 2015-08-13 MED ORDER — ALBUTEROL SULFATE HFA 108 (90 BASE) MCG/ACT IN AERS: 2.0000 | INHALATION_SPRAY | Freq: Four times a day (QID) | RESPIRATORY_TRACT | Status: DC | PRN

## 2015-08-13 MED ORDER — METFORMIN HCL 500 MG PO TABS
500.0000 mg | ORAL_TABLET | Freq: Two times a day (BID) | ORAL | Status: DC
  Administered 2015-08-14 – 2015-08-16 (×5): 500 mg via ORAL
  Filled 2015-08-13 (×9): qty 1

## 2015-08-13 MED ORDER — INSULIN ASPART 100 UNIT/ML ~~LOC~~ SOLN
0.0000 [IU] | Freq: Three times a day (TID) | SUBCUTANEOUS | Status: DC
  Administered 2015-08-14: 2 [IU] via SUBCUTANEOUS
  Administered 2015-08-14 – 2015-08-16 (×6): 3 [IU] via SUBCUTANEOUS

## 2015-08-13 MED ORDER — ZIPRASIDONE MESYLATE 20 MG IM SOLR: 20.0000 mg | INTRAMUSCULAR | Status: DC | PRN

## 2015-08-13 NOTE — Progress Notes (Signed)
Admission Note: Patient is alert and oriented to time, person, and place.  Patient presents with anxious mood and affect.  Denies pain, auditory and visual hallucinations.  Patient was angry and anxious during assessment.  Paranoid about being here and concern about the safety of her children.  Admission packet reviewed and consent signed for treatment.  Patient oriented to the unit, staff and room.  Routine safety checks initiated per protocol.  Patient safe on the unit.

## 2015-08-13 NOTE — Tx Team (Signed)
Initial Interdisciplinary Treatment Plan   PATIENT STRESSORS: Financial difficulties Health problems Marital or family conflict Medication change or noncompliance Substance abuse   PATIENT STRENGTHS: Ability for insight Average or above average intelligence Capable of independent living Communication skills Motivation for treatment/growth Supportive family/friends   PROBLEM LIST: Problem List/Patient Goals Date to be addressed Date deferred Reason deferred Estimated date of resolution  "Peace" 08/13/15           "Motivation" 08/13/15           Depression 08/13/15           Substance Abuse 08/13/15                  DISCHARGE CRITERIA:  Ability to meet basic life and health needs Adequate post-discharge living arrangements Medical problems require only outpatient monitoring Motivation to continue treatment in a less acute level of care Verbal commitment to aftercare and medication compliance  PRELIMINARY DISCHARGE PLAN: Attend aftercare/continuing care group Attend PHP/IOP Outpatient therapy Return to previous living arrangement  PATIENT/FAMIILY INVOLVEMENT: This treatment plan has been presented to and reviewed with the patient, [name].  The patient and family have been given the opportunity to ask questions and make suggestions.  Mart Piggs 08/13/2015, 6:22 PM

## 2015-08-13 NOTE — Progress Notes (Signed)
D:Patient in hallway on approach.  Patient appears anxious.  Patient states she is having visual hallucinations and denies SI/HI.  Patient verbally contracts for safety.  Patient appears anxious and has been pacing in the hallways.  Patient states she is watching everyone and admits she is paranoid.  Patient states she is happy that her boyfriend visited today.    A: Staff to monitor Q 15 mins for safety.  Encouragement and support offered.  Scheduled medications administered per orders.  Risperdal administered prn for agitation. R: Patient remains safe on the unit.  Patient attended group tonight. Patient visible on the unit and interacting with peers.

## 2015-08-13 NOTE — Progress Notes (Signed)
Adult Psychoeducational Group Note  Date:  08/13/2015 Time:  9:25 PM  Group Topic/Focus:  Wrap-Up Group:   The focus of this group is to help patients review their daily goal of treatment and discuss progress on daily workbooks.  Participation Level:  Active  Participation Quality:  Appropriate and Attentive  Affect:  Appropriate  Cognitive:  Appropriate  Insight: Appropriate and Good  Engagement in Group:  Engaged  Modes of Intervention:  Education  Additional Comments:  Pt mentioned she had a long day. Pt goal is to get some rest so she can have "peace of mind"   Jerline Pain 08/13/2015, 9:25 PM

## 2015-08-13 NOTE — Consult Note (Signed)
[facility] Face-to-Face Psychiatry Consult   Reason for Consult:  Delusion, Agitation, Paranoia. Referring Physician:  EDP Patient Identification: [name] MRN:  [id number] Principal Diagnosis: Schizoaffective disorder, bipolar type [facility]) Diagnosis:   Patient Active Problem List   Diagnosis Date Noted  . Schizoaffective disorder, bipolar type ([place]) [F25.0] 08/13/2015    Priority: High  . Cannabis use disorder, severe, dependence ([place]) [F12.20] 08/13/2015  . Yeast vaginitis [B37.3] 11/07/2014  . Vulvovaginal candidiasis [B37.3] 10/16/2014  . Night sweats [R61] 02/22/2014  . 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 Asthma, chronic [J45.909] 03/12/2013  . Hyperlipidemia [E78.5] 12/10/2011  . DM (diabetes mellitus), type 2, uncontrolled ([place]) [E11.65] 12/07/2011  . HIDRADENITIS SUPPURATIVA [L73.2] 06/12/2009  . OBESITY, UNSPECIFIED [E66.9] 10/31/2008  . BACK PAIN, LUMBAR [M54.5] 10/31/2008  . 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 TOBACCO DEPENDENCE [F17.200] 09/29/2006  . PAPANICOLAOU SMEAR, ABNORMAL [795.0] 09/29/2006    Total Time spent with patient: 45 minutes  Subjective:   [name] is a 40 y.o. female patient admitted with  Paranoia, Delusion, Paranoia.  HPI:  AA female, 40 years old was evaluated after she was IVC by the Police.  Patient reports that she has been dealing with excessive anger out burt and not sleeping.  She stated that her family members think "I am delusional and crazy"  She stated that she felt that people are out to hurt her and her children.  She walked to [facility] with her boyfriend seeking help.  She reports she was diagnosed with Bipolar disorder and Schizophrenia in the past by her PMD.  Patient reports going through a lot of stress but could not explain.  Patient reports receiving only 4 hours of sleep at night and states she smokes a blunt of Marijuana daily.  She has been accepted for admission and we will be seeking placement at  any facility with available beds.  Past Psychiatric History: Bipolar disorder, mixed type  Risk to Self:   Risk to Others:   Prior Inpatient Therapy:   Prior Outpatient Therapy:    Past Medical History:  Past Medical History  Diagnosis Date  . Hidradenitis suppurativa   . Depressive disorder, not elsewhere classified   . Tobacco abuse   . Bipolar 1 disorder ([place])   . Obesity   . Gestational diabetes   . Bronchitis     Past Surgical History  Procedure Laterality Date  . Tubal ligation     Family History:  Family History  Problem Relation Age of Onset  . Hypertension Mother   . Schizophrenia Mother   . Hypertension Father   . Diabetes Father    Family Psychiatric  History:   Unknown Social History:  History  Alcohol Use  . 0.0 oz/week  . 0 Standard drinks or equivalent per week     History  Drug Use  . Yes  . Special: Marijuana    Social History   Social History  . Marital Status: Single    Spouse Name: N/A  . Number of Children: N/A  . Years of Education: N/A   Social History Main Topics  . Smoking status: Current Every Day Smoker -- 1.00 packs/day    Types: Cigarettes  . Smokeless tobacco: Not on file  . Alcohol Use: 0.0 oz/week    0 Standard drinks or equivalent per week  . Drug Use: Yes    Special: Marijuana  . Sexual Activity: Not on file   Other Topics Concern  . Not  on file   Social History Narrative   Additional Social History:    Pain Medications: unk Prescriptions: unk Over the Counter: unk History of alcohol / drug use?:  (unk)   Allergies:  No Known Allergies  Labs:  Results for orders placed or performed during the hospital encounter of 08/12/15 (from the past 48 hour(s))  Comprehensive metabolic panel     Status: Abnormal   Collection Time: 08/12/15  4:22 PM  Result Value Ref Range   Sodium 134 (L) 135 - 145 mmol/L   Potassium 3.3 (L) 3.5 - 5.1 mmol/L   Chloride 99 (L) 101 - 111 mmol/L   CO2 21 (L) 22 - 32 mmol/L    Glucose, Bld 251 (H) 65 - 99 mg/dL   BUN 11 6 - 20 mg/dL   Creatinine, Ser 0.85 0.44 - 1.00 mg/dL   Calcium 9.2 8.9 - 10.3 mg/dL   Total Protein 8.5 (H) 6.5 - 8.1 g/dL   Albumin 4.7 3.5 - 5.0 g/dL   AST 17 15 - 41 U/L   ALT 12 (L) 14 - 54 U/L   Alkaline Phosphatase 58 38 - 126 U/L   Total Bilirubin 0.9 0.3 - 1.2 mg/dL   GFR calc non Af Amer >60 >60 mL/min   GFR calc Af Amer >60 >60 mL/min    Comment: (NOTE) The eGFR has been calculated using the CKD EPI equation. This calculation has not been validated in all clinical situations. eGFR's persistently <60 mL/min signify possible Chronic Kidney Disease.    Anion gap 14 5 - 15  CBC     Status: Abnormal   Collection Time: 08/12/15  4:22 PM  Result Value Ref Range   WBC 12.9 (H) 4.0 - 10.5 K/uL   RBC 5.16 (H) 3.87 - 5.11 MIL/uL   Hemoglobin 14.0 12.0 - 15.0 g/dL   HCT 39.2 36.0 - 46.0 %   MCV 76.0 (L) 78.0 - 100.0 fL   MCH 27.1 26.0 - 34.0 pg   MCHC 35.7 30.0 - 36.0 g/dL   RDW 14.5 11.5 - 15.5 %   Platelets 506 (H) 150 - 400 K/uL  Ethanol (ETOH)     Status: None   Collection Time: 08/12/15  4:23 PM  Result Value Ref Range   Alcohol, Ethyl (B) <5 <5 mg/dL    Comment:        LOWEST DETECTABLE LIMIT FOR SERUM ALCOHOL IS 5 mg/dL FOR MEDICAL PURPOSES ONLY   Salicylate level     Status: None   Collection Time: 08/12/15  4:23 PM  Result Value Ref Range   Salicylate Lvl <3.0 2.8 - 30.0 mg/dL  Acetaminophen level     Status: Abnormal   Collection Time: 08/12/15  4:23 PM  Result Value Ref Range   Acetaminophen (Tylenol), Serum <10 (L) 10 - 30 ug/mL    Comment:        THERAPEUTIC CONCENTRATIONS VARY SIGNIFICANTLY. A RANGE OF 10-30 ug/mL MAY BE AN EFFECTIVE CONCENTRATION FOR MANY PATIENTS. HOWEVER, SOME ARE BEST TREATED AT CONCENTRATIONS OUTSIDE THIS RANGE. ACETAMINOPHEN CONCENTRATIONS >150 ug/mL AT 4 HOURS AFTER INGESTION AND >50 ug/mL AT 12 HOURS AFTER INGESTION ARE OFTEN ASSOCIATED WITH TOXIC REACTIONS.   Urine rapid  drug screen (hosp performed) (Not at [facility])     Status: Abnormal   Collection Time: 08/12/15  4:31 PM  Result Value Ref Range   Opiates NONE DETECTED NONE DETECTED   Cocaine NONE DETECTED NONE DETECTED   Benzodiazepines NONE DETECTED NONE DETECTED  Amphetamines NONE DETECTED NONE DETECTED   Tetrahydrocannabinol POSITIVE (A) NONE DETECTED   Barbiturates NONE DETECTED NONE DETECTED    Comment:        DRUG SCREEN FOR MEDICAL PURPOSES ONLY.  IF CONFIRMATION IS NEEDED FOR ANY PURPOSE, NOTIFY LAB WITHIN 5 DAYS.        LOWEST DETECTABLE LIMITS FOR URINE DRUG SCREEN Drug Class       Cutoff (ng/mL) Amphetamine      1000 Barbiturate      200 Benzodiazepine   017 Tricyclics       510 Opiates          300 Cocaine          300 THC              50   POC urine preg, ED (not at [facility])     Status: None   Collection Time: 08/12/15  4:38 PM  Result Value Ref Range   Preg Test, Ur NEGATIVE NEGATIVE    Comment:        THE SENSITIVITY OF THIS METHODOLOGY IS >24 mIU/mL   CBG monitoring, ED     Status: None   Collection Time: 08/12/15 11:14 PM  Result Value Ref Range   Glucose-Capillary 98 65 - 99 mg/dL  CBG monitoring, ED     Status: Abnormal   Collection Time: 08/13/15  8:18 AM  Result Value Ref Range   Glucose-Capillary 161 (H) 65 - 99 mg/dL    Current Facility-Administered Medications  Medication Dose Route Frequency Provider Last Rate Last Dose  . albuterol (PROVENTIL HFA;VENTOLIN HFA) 108 (90 Base) MCG/ACT inhaler 2 puff  2 puff Inhalation Q6H PRN Dorie Rank, MD      . amantadine (SYMMETREL) capsule 100 mg  100 mg Oral BID Candido Flott      . glipiZIDE (GLUCOTROL) tablet 10 mg  10 mg Oral BID AC Dorie Rank, MD   10 mg at 08/13/15 0847  . ibuprofen (ADVIL,MOTRIN) tablet 600 mg  600 mg Oral Q6H PRN Dorie Rank, MD      . lamoTRIgine (LAMICTAL) tablet 200 mg  200 mg Oral Daily Dorie Rank, MD   200 mg at 08/13/15 1058  . levonorgestrel-ethinyl estradiol (SEASONALE,INTROVALE,JOLESSA)  0.15-0.03 MG per tablet 1 tablet  1 tablet Oral Daily Dorie Rank, MD   1 tablet at 08/12/15 1827  . metFORMIN (GLUCOPHAGE) tablet 500 mg  500 mg Oral BID WC Dorie Rank, MD   500 mg at 08/13/15 0847  . paliperidone (INVEGA) 24 hr tablet 6 mg  6 mg Oral Daily Dontavian Marchi      . pravastatin (PRAVACHOL) tablet 20 mg  20 mg Oral Daily Dorie Rank, MD   20 mg at 08/13/15 1057   Current Outpatient Prescriptions  Medication Sig Dispense Refill  . albuterol (PROVENTIL HFA;VENTOLIN HFA) 108 (90 BASE) MCG/ACT inhaler Inhale 2 puffs into the lungs every 6 (six) hours as needed for wheezing or shortness of breath. 2 Inhaler 3  . aspirin-sod bicarb-citric acid (ALKA-SELTZER) 325 MG TBEF tablet Take 325 mg by mouth every 6 (six) hours as needed (congestion).    . cyclobenzaprine (FLEXERIL) 10 MG tablet Take 10 mg by mouth at bedtime.  4  . dextromethorphan-guaiFENesin (MUCINEX DM) 30-600 MG 12hr tablet Take 1 tablet by mouth 2 (two) times daily as needed for cough.    . escitalopram (LEXAPRO) 20 MG tablet Take 20 mg by mouth daily.  4  . glipiZIDE (GLUCOTROL) 10 MG tablet Take 1  tablet (10 mg total) by mouth 2 (two) times daily before a meal. 14 tablet 0  . lamoTRIgine (LAMICTAL) 200 MG tablet Take 1 tablet (200 mg total) by mouth daily. 30 tablet 5  . levonorgestrel-ethinyl estradiol (SEASONALE,INTROVALE,JOLESSA) 0.15-0.03 MG tablet Take 1 tablet by mouth daily.  3  . meloxicam (MOBIC) 15 MG tablet Take 15 mg by mouth daily.  3  . metFORMIN (GLUCOPHAGE) 500 MG tablet Take 1 tablet (500 mg total) by mouth 2 (two) times daily with a meal. 14 tablet 0  . glucose blood (ONE TOUCH TEST STRIPS) test strip Use as instructed 100 each 12  . ibuprofen (ADVIL,MOTRIN) 600 MG tablet Take 1 tablet (600 mg total) by mouth every 8 (eight) hours as needed. (Patient not taking: Reported on 08/12/2015) 90 tablet 0  . Lancets (ONETOUCH ULTRASOFT) lancets Use as instructed. 100 each 12  . Lancets (ONETOUCH ULTRASOFT) lancets USE AS  INSTRUCTED. 100 each 5  . pravastatin (PRAVACHOL) 20 MG tablet Take 1 tablet (20 mg total) by mouth daily. (Patient not taking: Reported on 07/04/2015) 30 tablet 3    Musculoskeletal: Strength & Muscle Tone: within normal limits Gait & Station: normal Patient leans: N/A  Psychiatric Specialty Exam: Review of Systems  Constitutional: Negative.   HENT: Negative.   Eyes: Negative.   Respiratory: Negative.   Cardiovascular: Negative.   Gastrointestinal: Negative.   Genitourinary: Negative.   Musculoskeletal: Negative.   Skin: Negative.   Neurological: Negative.   Endo/Heme/Allergies: Negative.     Blood pressure 145/80, pulse 80, temperature 98.6 F (37 C), temperature source Oral, resp. rate 19, SpO2 100 %.There is no weight on file to calculate BMI.  General Appearance: Casual  Eye Contact::  Good  Speech:  Clear and Coherent and Normal Rate  Volume:  Normal  Mood:  Anxious and Dysphoric  Affect:  Congruent  Thought Process:  Coherent  Orientation:  Full (Time, Place, and Person)  Thought Content:  Delusions and Paranoid Ideation  Suicidal Thoughts:  No  Homicidal Thoughts:  No  Memory:  Immediate;   Good Recent;   Good Remote;   Good  Judgement:  Poor  Insight:  Fair  Psychomotor Activity:  Psychomotor Retardation  Concentration:  Fair  Recall:  [place] of Knowledge:Fair  Language: Good  Akathisia:  No  Handed:  Right  AIMS (if indicated):     Assets:  Desire for Improvement  ADL's:  Intact  Cognition: WNL  Sleep:      Treatment Plan Summary: Daily contact with patient to assess and evaluate symptoms and progress in treatment and Medication management  Disposition:  Accepted for admission, we will seek placement at any facility with available beds.   We will resume home medications.  Delfin Gant   PMHNP-BC 08/13/2015 12:03 PM Patient seen face-to-face for psychiatric evaluation, chart reviewed and case discussed with the physician extender and  developed treatment plan. Reviewed the information documented and agree with the treatment plan. Corena Pilgrim, MD

## 2015-08-13 NOTE — ED Notes (Signed)
Patient has been discharged to adult behavioral health hospital.  Patient was accompanied by [employer].  Patient was given discharge information and acknowledge understanding all belongings were returned to patient.

## 2015-08-13 NOTE — Progress Notes (Signed)
Inpatient Diabetes Program Recommendations  AACE/ADA: New Consensus Statement on Inpatient Glycemic Control (2015)  Target Ranges:  Prepandial:   less than 140 mg/dL      Peak postprandial:   less than 180 mg/dL (1-2 hours)      Critically ill patients:  140 - 180 mg/dL   Review of Glycemic Control  Diabetes history: DM2 Outpatient Diabetes medications: glipizide 10 bid, metformin 500 bid Current orders for Inpatient glycemic control: same as above  Results for MADIHA, CISSEL (MRN [id number]) as of 08/13/2015 09:45  Ref. Range 08/12/2015 23:14 08/13/2015 08:18  Glucose-Capillary Latest Ref Range: 65-99 mg/dL 98 161 (H)  Results for TERISSA, STARR (MRN [id number]) as of 08/13/2015 09:45  Ref. Range 07/04/2015 08:54  Hemoglobin A1C Unknown 10.0   Poor control prior to admission. Likely not taking meds at home.  Inpatient Diabetes Program Recommendations:    Please consider addition of Novolog moderate tidwc and hs.  Will continue to follow. Thank you. Lorenda Peck, RD, LDN, CDE Inpatient Diabetes Coordinator [phone]

## 2015-08-13 NOTE — ED Notes (Signed)
D-  Patient has been resting in her room for the majority of the shift. Patient has had an incident of agitation toward peer but was easily redirect.  Patient has been compliant with medications and treatment while in the ED.   A- assess patient for safety, offer medications as prescribed engage writer in 1:1 staff talks, prepare patient for discharge to behavioral health hospital.   D-  Patient able to contract for safety.

## 2015-08-13 NOTE — ED Notes (Signed)
Pt very agitated, pushing furniture in the rooms, crying, yelling and pacing.  PA [facility] notified, med orders given.

## 2015-08-14 ENCOUNTER — Encounter (HOSPITAL_COMMUNITY): Payer: Self-pay | Admitting: Psychiatry

## 2015-08-14 DIAGNOSIS — F3162 Bipolar disorder, current episode mixed, moderate: Secondary | ICD-10-CM

## 2015-08-14 LAB — BASIC METABOLIC PANEL
Anion gap: 12 (ref 5–15)
BUN: 9 mg/dL (ref 6–20)
CHLORIDE: 100 mmol/L — AB (ref 101–111)
CO2: 24 mmol/L (ref 22–32)
Calcium: 9.2 mg/dL (ref 8.9–10.3)
Creatinine, Ser: 0.78 mg/dL (ref 0.44–1.00)
GFR calc Af Amer: >60 mL/min (ref >60)
Glucose, Bld: 203 mg/dL — ABNORMAL HIGH (ref 65–99)
POTASSIUM: 3.2 mmol/L — AB (ref 3.5–5.1)
SODIUM: 136 mmol/L (ref 135–145)

## 2015-08-14 LAB — TSH: TSH: 0.564 u[IU]/mL (ref 0.350–4.500)

## 2015-08-14 LAB — GLUCOSE, CAPILLARY
GLUCOSE-CAPILLARY: 132 mg/dL — AB (ref 65–99)
GLUCOSE-CAPILLARY: 192 mg/dL — AB (ref 65–99)
Glucose-Capillary: 192 mg/dL — ABNORMAL HIGH (ref 65–99)
Glucose-Capillary: 196 mg/dL — ABNORMAL HIGH (ref 65–99)

## 2015-08-14 MED ORDER — POTASSIUM CHLORIDE CRYS ER 10 MEQ PO TBCR
10.0000 meq | EXTENDED_RELEASE_TABLET | Freq: Three times a day (TID) | ORAL | Status: AC
  Administered 2015-08-14 – 2015-08-15 (×4): 10 meq via ORAL
  Filled 2015-08-14 (×4): qty 1

## 2015-08-14 MED ORDER — RISPERIDONE 2 MG PO TBDP
2.0000 mg | ORAL_TABLET | Freq: Every day | ORAL | Status: DC
  Administered 2015-08-14: 2 mg via ORAL
  Filled 2015-08-14 (×3): qty 1

## 2015-08-14 NOTE — BHH Suicide Risk Assessment (Signed)
[facility] Admission Suicide Risk Assessment   Nursing information obtained from:  Patient Demographic factors:  Unemployed Current Mental Status:  NA Loss Factors:  Financial problems / change in socioeconomic status Historical Factors:  NA Risk Reduction Factors:  Responsible for children under 40 years of age Total Time spent with patient: 45 minutes Principal Problem:  Bipolar Disorder, Mixed  Diagnosis:   Patient Active Problem List   Diagnosis Date Noted  . Schizoaffective disorder, bipolar type ([place]) [F25.0] 08/13/2015  . Cannabis use disorder, severe, dependence ([place]) [F12.20] 08/13/2015  . Schizoaffective disorder ([place]) [F25.9] 08/13/2015  . Yeast vaginitis [B37.3] 11/07/2014  . Vulvovaginal candidiasis [B37.3] 10/16/2014  . Night sweats [R61] 02/22/2014  . 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 Asthma, chronic [J45.909] 03/12/2013  . Hyperlipidemia [E78.5] 12/10/2011  . DM (diabetes mellitus), type 2, uncontrolled ([place]) [E11.65] 12/07/2011  . HIDRADENITIS SUPPURATIVA [L73.2] 06/12/2009  . OBESITY, UNSPECIFIED [E66.9] 10/31/2008  . BACK PAIN, LUMBAR [M54.5] 10/31/2008  . 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 TOBACCO DEPENDENCE [F17.200] 09/29/2006  . PAPANICOLAOU SMEAR, ABNORMAL [795.0] 09/29/2006     Continued Clinical Symptoms:  Alcohol Use Disorder Identification Test Final Score (AUDIT): 0 The "Alcohol Use Disorders Identification Test", Guidelines for Use in Primary Care, Second Edition.  World Pharmacologist [facility]). Score between 0-7:  no or low risk or alcohol related problems. Score between 8-15:  moderate risk of alcohol related problems. Score between 16-19:  high risk of alcohol related problems. Score 20 or above:  warrants further diagnostic evaluation for alcohol dependence and treatment.   CLINICAL FACTORS:  40 year old female, reports prior history of Bipolar Disorder diagnosis, has also been diagnosed with Schizoaffective  Disorder in the past. Presents with worsening depression which she attributes to significant psycho-social stressors and non compliance to medications for several weeks. Also, reports cannabis dependence.  Reports symptoms suggestive of mixed episode- depression, sadness, but at the same time recent subjective sense of increased energy, irritability.   Physical Exam  ROS  Blood pressure 110/67, pulse 120, temperature 98.2 F (36.8 C), temperature source Oral, resp. rate 18, height 5\' 2"  (1.575 m), weight 163 lb (73.936 kg), last menstrual period 07/18/2015, SpO2 99 %.Body mass index is 29.81 kg/(m^2).   see admit note MSE   COGNITIVE FEATURES THAT CONTRIBUTE TO RISK:  Closed-mindedness and Loss of executive function    SUICIDE RISK:   Moderate:  Frequent suicidal ideation with limited intensity, and duration, some specificity in terms of plans, no associated intent, good self-control, limited dysphoria/symptomatology, some risk factors present, and identifiable protective factors, including available and accessible social support.  PLAN OF CARE: Patient will be admitted to inpatient psychiatric unit for stabilization and safety. Will provide and encourage milieu participation. Provide medication management and maked adjustments as needed.  Will follow daily.    Medical Decision Making:  Review of Psycho-Social Stressors (1), Review or order clinical lab tests (1), Established Problem, Worsening (2) and Review of New Medication or Change in Dosage (2)  I certify that inpatient services furnished can reasonably be expected to improve the patient's condition.   COBOS, FERNANDO 08/14/2015, 12:25 PM

## 2015-08-14 NOTE — Tx Team (Signed)
Interdisciplinary Treatment Plan Update (Adult) Date: 08/14/2015   Date: 08/14/2015 1:49 PM  Progress in Treatment:  Attending groups: Yes  Participating in groups: Yes  Taking medication as prescribed: Yes  Tolerating medication: Yes  Family/Significant othe contact made: No, CSW assessing for appropriate contact Patient understands diagnosis: Yes AEB seeking help with depression Discussing patient identified problems/goals with staff: Yes  Medical problems stabilized or resolved: Yes  Denies suicidal/homicidal ideation: Yes Patient has not harmed self or Others: Yes   New problem(s) identified: None identified at this time.   Discharge Plan or Barriers: CSW will assess for appropriate discharge plan and relevant barriers.   Additional comments:  Patient and CSW reviewed pt's identified goals and treatment plan. Patient verbalized understanding and agreed to treatment plan. CSW reviewed [facility] "Discharge Process and Patient Involvement" Form. Pt verbalized understanding of information provided and signed form.   Reason for Continuation of Hospitalization:  Anxiety Depression Medical Issues Medication stabilization Suicidal ideation Withdrawal symptoms Psychotic  Estimated length of stay: 3-5 days  Review of initial/current patient goals per problem list:   1.  Goal(s): Patient will participate in aftercare plan  Met:  No  Target date: 3-5 days from date of admission   As evidenced by: Patient will participate within aftercare plan AEB aftercare provider and housing plan at discharge being identified.  08/14/15: CSW to work with Pt to assess for appropriate discharge plan and faciliate appointments and referrals as needed prior to d/c.  2.  Goal (s): Patient will exhibit decreased depressive symptoms and suicidal ideations.  Met:  No  Target date: 3-5 days from date of admission   As evidenced by: Patient will utilize self rating of depression at 3 or below and  demonstrate decreased signs of depression or be deemed stable for discharge by MD. 08/14/15: Pt was admitted with symptoms of depression, rating 10/10. Pt continues to present with flat affect and depressive symptoms.  Pt will demonstrate decreased symptoms of depression and rate depression at 3/10 or lower prior to discharge.  5.  Goal(s): Patient will demonstrate decreased signs of psychosis  . Met:  No . Target date: 3-5 days from date of admission  . As evidenced by: Patient will demonstrate decreased frequency of AVH or return to baseline function o 08/14/15: Pt endorses visual hallucinations and expresses feeling paranoid. Observed to be pacing the halls with anxious affect  Attendees:  Patient:    Family:    Physician: Dr. Parke Poisson, MD  08/14/2015 1:49 PM  Nursing: Lars Pinks, RN Case manager  08/14/2015 1:49 PM  Clinical Social Worker Peri Maris, [place] 08/14/2015 1:49 PM  Other: Tilden Fossa, [place] 08/14/2015 1:49 PM  Clinical: Marcella Dubs, RN 08/14/2015 1:49 PM  Other: , RN Charge Nurse 08/14/2015 1:49 PM  Other: Hilda Lias, [place], [place] Social Work [phone]

## 2015-08-14 NOTE — Progress Notes (Signed)
DAR: Pt present with jovial mood and bright affect. Pt has been observed in the milieu interacting with peers and staff. Pt took her medications as prescribed, attended the group and participated. As per self inventory, pt had a good sleep, fair appetite, normal energy and good concentration. Pt rates anxiety at 4, hopelessness at 0 and anxiety at 7. Pt's safety ensured with 15 minute and environmental checks. Pt currently denies SI/HI and A/V hallucinations. Pt verbally agrees to seek staff if SI/HI or A/VH occurs and to consult with staff before acting on these thoughts. Will continue POC.

## 2015-08-14 NOTE — BHH Group Notes (Signed)
[facility] Association Group Therapy 08/14/2015 1:15pm  Type of Therapy: Mental Health Association Presentation  Participation Level: Active  Participation Quality: Attentive  Affect: Appropriate  Cognitive: Oriented  Insight: Developing/Improving  Engagement in Therapy: Engaged  Modes of Intervention: Discussion, Education and Socialization  Summary of Progress/Problems: Mental Health Association ([place]) Speaker came to talk about his personal journey with substance abuse and addiction. The pt processed ways by which to relate to the speaker. [place] speaker provided handouts and educational information pertaining to groups and services offered by the [facility]. Pt was engaged in speaker's presentation and was receptive to resources provided.    Peri Maris, LCSWA 08/14/2015 1:49 PM
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
## 2015-08-14 NOTE — Progress Notes (Signed)
Patient ID: [name], female   DOB: [birth year], 40 y.o.   MRN: [id number]  Adult Psychoeducational Group Note  Date:  08/14/2015 Time:  09:00am  Group Topic/Focus:  Orientation:   The focus of this group is to educate the patient on the purpose and policies of crisis stabilization and provide a format to answer questions about their admission.  The group details unit policies and expectations of patients while admitted.  Participation Level:  Active  Participation Quality:  Appropriate  Affect:  Appropriate  Cognitive:  Appropriate  Insight: Improving  Engagement in Group:  Improving  Modes of Intervention:  Discussion, Education, Orientation and Support  Additional Comments:  Pt able to identify one goal to accomplish today.   Elenore Rota 08/14/2015, 10:20 AM
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
## 2015-08-14 NOTE — Progress Notes (Signed)
Adult Psychoeducational Group Note  Date:  08/14/2015 Time:  8:52 PM  Group Topic/Focus:  Wrap-Up Group:   The focus of this group is to help patients review their daily goal of treatment and discuss progress on daily workbooks.  Participation Level:  Active  Participation Quality:  Appropriate and Attentive  Affect:  Appropriate  Cognitive:  Alert and Appropriate  Insight: Appropriate and Good  Engagement in Group:  Engaged  Modes of Intervention:  Education  Additional Comments:  Pt had a good day. Pt goal for tomorrow is to start moving forward in positive directions.   Jerline Pain 08/14/2015, 8:52 PM

## 2015-08-14 NOTE — Progress Notes (Signed)
Patient ID: [name], female   DOB: 07-23-1976, 40 y.o.   MRN: [id number] D: client has a visitor this evening, she is animated and pleasant. Client reports she was admitted because "I was too stressed, put me in delusion, psychosis, then my period came on two times in December" "needed to get medications regulated" Client reports she has three boys 26, 97, and 40 years old. A: Writer provided emotional support, reviewed medications, administered as prescribed. Staff will monitor q51min for safety. R: Client is safe of the unit, attend group.

## 2015-08-15 ENCOUNTER — Other Ambulatory Visit: Payer: Self-pay

## 2015-08-15 LAB — GLUCOSE, CAPILLARY
Glucose-Capillary: 187 mg/dL — ABNORMAL HIGH (ref 65–99)
Glucose-Capillary: 161 mg/dL — ABNORMAL HIGH (ref 65–99)

## 2015-08-15 MED ORDER — LORAZEPAM 1 MG PO TABS: 1.0000 mg | ORAL_TABLET | Freq: Once | ORAL | Status: DC

## 2015-08-15 MED ORDER — RISPERIDONE 2 MG PO TBDP
3.0000 mg | ORAL_TABLET | Freq: Every day | ORAL | Status: DC
  Administered 2015-08-15: 3 mg via ORAL
  Filled 2015-08-15 (×3): qty 1

## 2015-08-15 MED ORDER — ALUM & MAG HYDROXIDE-SIMETH 200-200-20 MG/5ML PO SUSP
30.0000 mL | ORAL | Status: DC | PRN
  Administered 2015-08-15: 30 mL via ORAL
  Filled 2015-08-15: qty 30

## 2015-08-15 NOTE — Progress Notes (Signed)
Adult Psychoeducational Group Note  Date:  08/15/2015 Time:  9:19 PM  Group Topic/Focus:  Wrap-Up Group:   The focus of this group is to help patients review their daily goal of treatment and discuss progress on daily workbooks.  Participation Level:  Active  Participation Quality:  Appropriate  Affect:  Appropriate  Cognitive:  Alert  Insight: Appropriate  Engagement in Group:  Engaged  Modes of Intervention:  Discussion  Additional Comments:  Pt stated that she had a good day and enjoyed going outside. She has found that the groups are very helpful for her. Her goal is to continue to think positive.   [name] 08/15/2015, 9:19 PM

## 2015-08-15 NOTE — [facility] Suicide Risk Assessment (Signed)
[place] INPATIENT:  Family/Significant Other Suicide Prevention Education  Suicide Prevention Education:  Education Completed; [place] Mo[place] [phone],   has been identified by the patient as the family member/significant other with whom the patient will be residing, and identified as the person(s) who will aid the patient in the event of a mental health crisis (suicidal ideations/suicide attempt).  With written consent from the patient, the family member/significant other has been provided the following suicide prevention education, prior to the and/or following the discharge of the patient.  The suicide prevention education provided includes the following:  Suicide risk factors  Suicide prevention and interventions  National Suicide Hotline telephone number  [facility] assessment telephone number  [facility] Assistance [place] and/or Residential Mobile Crisis Unit telephone number  Request made of family/significant other to:  Remove weapons (e.g., guns, rifles, knives), all items previously/currently identified as safety concern.    Remove drugs/medications (over-the-counter, prescriptions, illicit drugs), all items previously/currently identified as a safety concern.  The family member/significant other verbalizes understanding of the suicide prevention education information provided.  The family member/significant other agrees to remove the items of safety concern listed above.  Bo Mcclintock 08/15/2015, 5:38 PM

## 2015-08-15 NOTE — [facility] Group Notes (Signed)
[facility] LCSW Aftercare Discharge Planning Group Note  08/15/2015 8:45 AM  Participation Quality: Alert, Appropriate and Oriented  Mood/Affect: Appropriate  Depression Rating: 2  Anxiety Rating: 8  Thoughts of Suicide: Pt denies SI/HI  Will you contract for safety? Yes  Current AVH: Pt denies  Plan for Discharge/Comments: Pt attended discharge planning group and actively participated in group. CSW discussed suicide prevention education with the group and encouraged them to discuss discharge planning and any relevant barriers. Pt reports that her depression has decreased but she is still experiencing anxiety. She plans to return home and describes "taking things one day at a time at discharge."  Transportation Means: Pt reports access to transportation  Supports: No supports mentioned at this time  Peri Maris, [place] 08/15/2015 4:40 PM

## 2015-08-15 NOTE — Progress Notes (Signed)
[facility] MD Progress Note  08/15/2015 1:35 PM [name]  MRN:  [id number] Subjective:  Patient reports she is feeling " a little bit better". States she still feels anxious .  She states she realizes she still feels " kind of paranoid " . States that a man who had murdered the father of her children many years ago was recently murdered himself after being released from prison. States that this has led for her to be fearful that " they might think it was my family who did it , so they'll come after the kids . I know it sounds unrealistic but it is how things happen in the hood ".  States, however, " maybe I am making too much of this, I do get paranoid a lot ". Denies medication side effects. She states she is hoping for discharge soon. Objective : I have discussed case with treatment team and have met with patient. Today patient presents calm, pleasant. Presents with improved mood . Describes ongoing ruminations as above, and states she has difficulty trying to " figure out " if these concerns are realistic or paranoid. Does states she tends to be " paranoid". Of note, denies hallucinations and at this time does not appear internally preoccupied . No disruptive behaviors on unit. She is tolerating medications well .  Principal Problem: Moderate mixed bipolar I disorder (HCC) Diagnosis:   Patient Active Problem List   Diagnosis Date Noted  . Schizoaffective disorder, bipolar type ([place]) [F25.0] 08/13/2015  . Cannabis use disorder, severe, dependence ([place]) [F12.20] 08/13/2015  . Schizoaffective disorder ([place]) [F25.9] 08/13/2015  . Yeast vaginitis [B37.3] 11/07/2014  . Vulvovaginal candidiasis [B37.3] 10/16/2014  . Night sweats [R61] 02/22/2014  . 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 Asthma, chronic [J45.909] 03/12/2013  . Hyperlipidemia [E78.5] 12/10/2011  . DM (diabetes mellitus), type 2, uncontrolled ([place]) [E11.65] 12/07/2011  . HIDRADENITIS SUPPURATIVA [L73.2] 06/12/2009  . OBESITY,  UNSPECIFIED [E66.9] 10/31/2008  . BACK PAIN, LUMBAR [M54.5] 10/31/2008  . Moderate mixed bipolar I disorder ([place]) [F31.62] 01/25/2007  . TOBACCO DEPENDENCE [F17.200] 09/29/2006  . PAPANICOLAOU SMEAR, ABNORMAL [795.0] 09/29/2006   Total Time spent with patient: 25 minutes    Past Medical History:  Past Medical History  Diagnosis Date  . Hidradenitis suppurativa   . Depressive disorder, not elsewhere classified   . Tobacco abuse   . Bipolar 1 disorder ([place])   . Obesity   . Gestational diabetes   . Bronchitis     Past Surgical History  Procedure Laterality Date  . Tubal ligation     Family History:  Family History  Problem Relation Age of Onset  . Hypertension Mother   . Schizophrenia Mother   . Hypertension Father   . Diabetes Father    Social History:  History  Alcohol Use  . 0.0 oz/week  . 0 Standard drinks or equivalent per week     History  Drug Use  . Yes  . Special: Marijuana    Social History   Social History  . Marital Status: Single    Spouse Name: N/A  . Number of Children: N/A  . Years of Education: N/A   Social History Main Topics  . Smoking status: Current Every Day Smoker -- 1.00 packs/day    Types: Cigarettes  . Smokeless tobacco: None  . Alcohol Use: 0.0 oz/week    0 Standard drinks or equivalent per week  . Drug Use: Yes    Special: Marijuana  . Sexual Activity: Yes  Other Topics Concern  . None   Social History Narrative   Additional Social History:   Sleep: Good  Appetite:  Good  Current Medications: Current Facility-Administered Medications  Medication Dose Route Frequency Provider Last Rate Last Dose  . albuterol (PROVENTIL HFA;VENTOLIN HFA) 108 (90 Base) MCG/ACT inhaler 2 puff  2 puff Inhalation Q6H PRN Delfin Gant, NP      . alum & mag hydroxide-simeth (MAALOX/MYLANTA) 200-200-20 MG/5ML suspension 30 mL  30 mL Oral Q4H PRN Benjamine Mola, FNP   30 mL at 08/15/15 0953  . glipiZIDE (GLUCOTROL) tablet 10 mg  10 mg  Oral BID AC Delfin Gant, NP   10 mg at 08/15/15 0648  . ibuprofen (ADVIL,MOTRIN) tablet 600 mg  600 mg Oral Q6H PRN Delfin Gant, NP      . insulin aspart (novoLOG) injection 0-15 Units  0-15 Units Subcutaneous TID WC Laverle Hobby, PA-C   3 Units at 08/15/15 1217  . insulin aspart (novoLOG) injection 0-5 Units  0-5 Units Subcutaneous QHS Laverle Hobby, PA-C   3 Units at 08/13/15 2255  . lamoTRIgine (LAMICTAL) tablet 200 mg  200 mg Oral Daily Delfin Gant, NP   200 mg at 08/15/15 0823  . levonorgestrel-ethinyl estradiol (SEASONALE,INTROVALE,JOLESSA) 0.15-0.03 MG per tablet 1 tablet  1 tablet Oral Daily Delfin Gant, NP   1 tablet at 08/15/15 0835  . LORazepam (ATIVAN) tablet 1 mg  1 mg Oral Once Benjamine Mola, FNP   1 mg at 08/15/15 0800  . metFORMIN (GLUCOPHAGE) tablet 500 mg  500 mg Oral BID WC Delfin Gant, NP   500 mg at 08/15/15 3382  . pravastatin (PRAVACHOL) tablet 20 mg  20 mg Oral Daily Delfin Gant, NP   20 mg at 08/15/15 0823  . risperiDONE (RISPERDAL M-TABS) disintegrating tablet 2 mg  2 mg Oral QHS Jenne Campus, MD   2 mg at 08/14/15 2244    Lab Results:  Results for orders placed or performed during the hospital encounter of 08/13/15 (from the past 48 hour(s))  Glucose, capillary     Status: Abnormal   Collection Time: 08/13/15  8:40 PM  Result Value Ref Range   Glucose-Capillary 251 (H) 65 - 99 mg/dL  Glucose, capillary     Status: Abnormal   Collection Time: 08/14/15  6:18 AM  Result Value Ref Range   Glucose-Capillary 192 (H) 65 - 99 mg/dL  Basic metabolic panel     Status: Abnormal   Collection Time: 08/14/15  6:30 AM  Result Value Ref Range   Sodium 136 135 - 145 mmol/L   Potassium 3.2 (L) 3.5 - 5.1 mmol/L   Chloride 100 (L) 101 - 111 mmol/L   CO2 24 22 - 32 mmol/L   Glucose, Bld 203 (H) 65 - 99 mg/dL   BUN 9 6 - 20 mg/dL   Creatinine, Ser 0.78 0.44 - 1.00 mg/dL   Calcium 9.2 8.9 - 10.3 mg/dL   GFR calc non Af Amer >60  >60 mL/min   GFR calc Af Amer >60 >60 mL/min    Comment: (NOTE) The eGFR has been calculated using the CKD EPI equation. This calculation has not been validated in all clinical situations. eGFR's persistently <60 mL/min signify possible Chronic Kidney Disease.    Anion gap 12 5 - 15    Comment: Performed at [facility]  TSH     Status: None   Collection Time: 08/14/15  6:30 AM  Result Value Ref Range   TSH 0.564 0.350 - 4.500 uIU/mL    Comment: Performed at [facility]  Glucose, capillary     Status: Abnormal   Collection Time: 08/14/15 11:44 AM  Result Value Ref Range   Glucose-Capillary 132 (H) 65 - 99 mg/dL  Glucose, capillary     Status: Abnormal   Collection Time: 08/14/15  4:32 PM  Result Value Ref Range   Glucose-Capillary 192 (H) 65 - 99 mg/dL  Glucose, capillary     Status: Abnormal   Collection Time: 08/14/15  8:19 PM  Result Value Ref Range   Glucose-Capillary 196 (H) 65 - 99 mg/dL   Comment 1 Notify RN   Glucose, capillary     Status: Abnormal   Collection Time: 08/15/15  6:10 AM  Result Value Ref Range   Glucose-Capillary 187 (H) 65 - 99 mg/dL  Glucose, capillary     Status: Abnormal   Collection Time: 08/15/15 11:59 AM  Result Value Ref Range   Glucose-Capillary 161 (H) 65 - 99 mg/dL    Physical Findings: AIMS: Facial and Oral Movements Muscles of Facial Expression: None, normal Lips and Perioral Area: None, normal Jaw: None, normal Tongue: None, normal,Extremity Movements Upper (arms, wrists, hands, fingers): None, normal Lower (legs, knees, ankles, toes): None, normal, Trunk Movements Neck, shoulders, hips: None, normal, Overall Severity Severity of abnormal movements (highest score from questions above): None, normal Incapacitation due to abnormal movements: None, normal Patient's awareness of abnormal movements (rate only patient's report): No Awareness, Dental Status Current problems with teeth and/or  dentures?: No Does patient usually wear dentures?: No  CIWA:    COWS:     Musculoskeletal: Strength & Muscle Tone: within normal limits Gait & Station: normal Patient leans: N/A  Psychiatric Specialty Exam: ROS no headache, no SOB, no chest pain   Blood pressure 136/87, pulse 99, temperature 98.2 F (36.8 C), temperature source Oral, resp. rate 16, height '5\' 2"'$  (1.575 m), weight 163 lb (73.936 kg), last menstrual period 07/18/2015, SpO2 100 %.Body mass index is 29.81 kg/(m^2).  General Appearance: improved grooming  Eye Contact::  Good  Speech:  Normal Rate  Volume:  Normal  Mood:  less depressed, still anxious  Affect:  Appropriate  Thought Process:  Linear  Orientation:  Full (Time, Place, and Person)  Thought Content:  ruminative as above- does not endorse hallucinations and does not appear internally preoccupied at this time   Suicidal Thoughts:  No currently denies any suicidal or self injurious ideations  Homicidal Thoughts:  No  Memory:  recent and remote grossly intact   Judgement:  Other:  improving   Insight:  improving   Psychomotor Activity:  Normal- not restless or agitated   Concentration:  Good  Recall:  Good  Fund of Knowledge:Good  Language: Good  Akathisia:  Negative  Handed:  Right  AIMS (if indicated):     Assets:  Communication Skills Desire for Improvement Resilience  ADL's:  Intact  Cognition: WNL  Sleep:  Number of Hours: 6.25  Assessment -  Patient's mood presents improved- does remain anxious, ruminative about stressors, and although plausible worries about  Criminal, gang related violence- see above - has insight into her tendency to " be paranoid " and worry excessively. She does think medications are helping , and is not presenting with any akathisia or EPS symptoms at present . Treatment Plan Summary: Daily contact with patient to assess and evaluate symptoms and progress in  treatment, Medication management, Plan inpatient admission and  medications as below  Encourage group /milieu participation, to work on coping skills and symptom reduction Increase Risperidone to 3 mgrs QHS to address paranoid ideations - we have reviewed medication side effects and rationale Continue Lamictal 200 mgrs QDAY for mood disorder  Continue Metformin/ Insulin  For management of DM. Verne Cove 08/15/2015, 1:35 PM

## 2015-08-15 NOTE — Progress Notes (Signed)
Patient has been up and active on the unit, attended group this evening and has voiced no complaints. Patient currently denies having pain, -si/hi/a/v hall. Support and encouragement offered, safety maintained on unit, will continue to monitor.  

## 2015-08-15 NOTE — BHH Group Notes (Signed)
[place] LCSW Group Therapy 08/15/2015 1:15pm  Type of Therapy: Group Therapy- Feelings Around Relapse and Recovery  Participation Level: Active   Participation Quality:  Appropriate  Affect:  Appropriate  Cognitive: Alert and Oriented   Insight:  Developing   Engagement in Therapy: Developing/Improving and Engaged   Modes of Intervention: Clarification, Confrontation, Discussion, Education, Exploration, Limit-setting, Orientation, Problem-solving, Rapport Building, Art therapist, Socialization and Support  Summary of Progress/Problems: The topic for today was feelings about relapse. The group discussed what relapse prevention is to them and identified triggers that they are on the path to relapse. Members also processed their feeling towards relapse and were able to relate to common experiences. Group also discussed coping skills that can be used for relapse prevention.  Pt contributes appropriately to group discussion, identifying financial stress as a trigger for relapse. She describes a need to continue her treatment at discharge in order to avoid relapse.   Therapeutic Modalities:   Cognitive Behavioral Therapy Solution-Focused Therapy Assertiveness Training Relapse Prevention Therapy    Norman Clay [id number] 08/15/2015 4:40 PM

## 2015-08-15 NOTE — Progress Notes (Signed)
Recreation Therapy Notes  Date: 01.13.2017 Time: 9:30am Location: [address] Group Room   Group Topic: Stress Management  Goal Area(s) Addresses:  Patient will actively participate in stress management techniques presented during session.   Behavioral Response: Appropriate, Engaged.   Intervention: Stress management techniques  Activity :  Deep Breathing Tapping. LRT provided instruction and demonstration on practice of Tappin. Technique was coupled with deep breathing.   Education:  Stress Management, Discharge Planning.   Education Outcome: Acknowledges education  Clinical Observations/Feedback: Patient actively engaged in technique introduced, expressed no concerns and demonstrated ability to practice independently post d/c.   Laureen Ochs Adante [place]ington, LRT/CTRS  Miliano Cotten L 08/15/2015 4:05 PM

## 2015-08-15 NOTE — Progress Notes (Signed)
NSG shift assessment. 7a-7p.   D: Affect blunted, mood depressed, behavior appropriate. Attends groups and participates. Pt reports that she slept good last night and she did not need medication to help her sleep.  She rates her depression as 2, feeling of hopelessness as 0, and anxiety is 2 out of 10.  Her goal today is to move forward with positive thoughts and she plans to pray and stay focused on future goals. She would like to be discharged on Saturday because her son is going to go away to college on Monday.  Cooperative with staff and is getting along well with peers.   A: Observed pt interacting in group and in the milieu: Support and encouragement offered. Safety maintained with observations every 15 minutes.   R:   Contracts for safety and continues to follow the treatment plan, working on learning new coping skills.

## 2015-08-16 DIAGNOSIS — F3163 Bipolar disorder, current episode mixed, severe, without psychotic features: Principal | ICD-10-CM | POA: Diagnosis present

## 2015-08-16 LAB — LIPID PANEL
CHOL/HDL RATIO: 2.7 ratio
CHOLESTEROL: 140 mg/dL (ref 0–200)
HDL: 51 mg/dL (ref >40)
LDL Cholesterol: 62 mg/dL (ref 0–99)
Triglycerides: 135 mg/dL (ref <150)
VLDL: 27 mg/dL (ref 0–40)

## 2015-08-16 MED ORDER — HYDROXYZINE HCL 25 MG PO TABS
25.0000 mg | ORAL_TABLET | Freq: Four times a day (QID) | ORAL | Status: DC | PRN
Start: 2015-08-16 — End: 2015-08-16
  Administered 2015-08-16: 25 mg via ORAL
  Filled 2015-08-16: qty 1

## 2015-08-16 MED ORDER — INSULIN ASPART 100 UNIT/ML ~~LOC~~ SOLN: 0.0000 [IU] | Freq: Every day | SUBCUTANEOUS | Status: DC

## 2015-08-16 MED ORDER — INSULIN ASPART 100 UNIT/ML ~~LOC~~ SOLN: 0.0000 [IU] | Freq: Three times a day (TID) | SUBCUTANEOUS | Status: DC

## 2015-08-16 MED ORDER — NICOTINE 14 MG/24HR TD PT24: 14.0000 mg | MEDICATED_PATCH | Freq: Every day | TRANSDERMAL | Status: DC

## 2015-08-16 MED ORDER — METFORMIN HCL 500 MG PO TABS: 500.0000 mg | ORAL_TABLET | Freq: Two times a day (BID) | ORAL | Status: DC

## 2015-08-16 MED ORDER — LAMOTRIGINE 200 MG PO TABS: 200.0000 mg | ORAL_TABLET | Freq: Every day | ORAL | Status: DC

## 2015-08-16 MED ORDER — HYDROXYZINE HCL 25 MG PO TABS: 25.0000 mg | ORAL_TABLET | Freq: Four times a day (QID) | ORAL | Status: DC | PRN

## 2015-08-16 MED ORDER — NICOTINE 14 MG/24HR TD PT24
14.0000 mg | MEDICATED_PATCH | Freq: Every day | TRANSDERMAL | Status: DC
  Filled 2015-08-16 (×3): qty 1

## 2015-08-16 MED ORDER — RISPERIDONE 3 MG PO TBDP: 3.0000 mg | ORAL_TABLET | Freq: Every day | ORAL | Status: DC

## 2015-08-16 NOTE — BHH Suicide Risk Assessment (Signed)
[facility] Discharge Suicide Risk Assessment   Demographic Factors:  NA  Total Time spent with patient: 30 minutes  Musculoskeletal: Strength & Muscle Tone: within normal limits Gait & Station: normal Patient leans: N/A  Psychiatric Specialty Exam: Physical Exam  Review of Systems  Psychiatric/Behavioral: Negative for depression, suicidal ideas, hallucinations and substance abuse. The patient is nervous/anxious (IMPROVED).   All other systems reviewed and are negative.   Blood pressure 136/87, pulse 99, temperature 98.2 F (36.8 C), temperature source Oral, resp. rate 16, height 5\' 2"  (1.575 m), weight 73.936 kg (163 lb), last menstrual period 07/18/2015, SpO2 100 %.Body mass index is 29.81 kg/(m^2).  General Appearance: Casual  Eye Contact::  Fair  Speech:  Clear and [id number]  Volume:  Normal  Mood:  Anxious IMPROVED  Affect:  Appropriate  Thought Process:  Coherent  Orientation:  Full (Time, Place, and Person)  Thought Content:  WDL  Suicidal Thoughts:  No  Homicidal Thoughts:  No  Memory:  Immediate;   Fair Recent;   Fair Remote;   Fair  Judgement:  Fair  Insight:  Fair  Psychomotor Activity:  Normal  Concentration:  Fair  Recall:  [employer] of Knowledge:Fair  Language: Fair  Akathisia:  No  Handed:  Right  AIMS (if indicated):     Assets:  Desire for Improvement  Sleep:  Number of Hours: 5.5  Cognition: WNL  ADL's:  Intact   Have you used any form of tobacco in the last 30 days? (Cigarettes, Smokeless Tobacco, Cigars, and/or Pipes): Yes  Has this patient used any form of tobacco in the last 30 days? (Cigarettes, Smokeless Tobacco, Cigars, and/or Pipes) Yes, Prescription provided for Nicotine patch.  Mental Status Per Nursing Assessment::   On Admission:  NA  Current Mental Status by Physician: pt denies SI/HI/AH/VH  Loss Factors: NA  Historical Factors: Impulsivity  Risk Reduction Factors:   Living with another person, especially a relative and  Positive social support  Continued Clinical Symptoms:  Alcohol/Substance Abuse/Dependencies Previous Psychiatric Diagnoses and Treatments  Cognitive Features That Contribute To Risk:  None    Suicide Risk:  Minimal: No identifiable suicidal ideation.  Patients presenting with no risk factors but with morbid ruminations; may be classified as minimal risk based on the severity of the depressive symptoms  Principal Problem: Bipolar disorder, curr episode mixed, severe, w/o psychotic features [facility]) Discharge Diagnoses:  Patient Active Problem List   Diagnosis Date Noted  . Bipolar disorder, curr episode mixed, severe, w/o psychotic features ([place]) [F31.63] 08/16/2015  . Schizoaffective disorder, bipolar type ([place]) [F25.0] 08/13/2015  . Cannabis use disorder, severe, dependence ([place]) [F12.20] 08/13/2015  . Yeast vaginitis [B37.3] 11/07/2014  . Vulvovaginal candidiasis [B37.3] 10/16/2014  . Night sweats [R61] 02/22/2014  . 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 Asthma, chronic [J45.909] 03/12/2013  . Hyperlipidemia [E78.5] 12/10/2011  . DM (diabetes mellitus), type 2, uncontrolled ([place]) [E11.65] 12/07/2011  . HIDRADENITIS SUPPURATIVA [L73.2] 06/12/2009  . OBESITY, UNSPECIFIED [E66.9] 10/31/2008  . BACK PAIN, LUMBAR [M54.5] 10/31/2008  . TOBACCO DEPENDENCE [F17.200] 09/29/2006  . PAPANICOLAOU SMEAR, ABNORMAL [795.0] 09/29/2006    Follow-up Information    Follow up with [facility] of Care On 09/03/2015.   Why:  at 1:15pm for medication management. Staff will call you within 1-2 weeks for your first therapy appointment. You are currently on the waitlist for therapy.   Contact information:   [address] [place], [place] [address] Phone: [phone]  Plan Of Care/Follow-up recommendations:  Activity:  No restrictions Diet:  regular Tests:  as needed Other:  follow up with after care  Is patient on multiple antipsychotic therapies at discharge:  No   Has  Patient had three or more failed trials of antipsychotic monotherapy by history:  No  Recommended Plan for Multiple Antipsychotic Therapies: NA    Arley Garant MD 08/16/2015, 9:11 AM

## 2015-08-16 NOTE — Progress Notes (Signed)
  [facility] Adult Case Management Discharge Plan :  Will you be returning to the same living situation after discharge:  Yes,  with family At discharge, do you have transportation home?: Yes,  boyfriend Do you have the ability to pay for your medications: Yes,  patient does not indicate there are barriers  Release of information consent forms completed and in the chart;  Patient's signature needed at discharge.  Patient to Follow up at: Follow-up Information    Follow up with [facility] of Care On 09/03/2015.   Why:  at 1:15pm for medication management. Staff will call you within 1-2 weeks for your first therapy appointment. You are currently on the waitlist for therapy.   Contact information:   [address] [place], [place] [address] Phone: [phone]      Next level of care provider has access to [place] and Suicide Prevention discussed: Yes,  with patient and boyfriend  Have you used any form of tobacco in the last 30 days? (Cigarettes, Smokeless Tobacco, Cigars, and/or Pipes): Yes  Has patient been referred to the Quitline?: Patient refused referral  Patient has been referred for addiction treatment: Pt. refused referral  Lysle Dingwall 08/16/2015, 12:52 PM

## 2015-08-16 NOTE — BHH Group Notes (Signed)
[place] Group Notes: (Clinical Social Work)   08/16/2015      Type of Therapy:  Group Therapy   Participation Level:  Did Not Attend  - had already discharged   Selmer Dominion, LCSW 08/16/2015, 12:51 PM

## 2015-08-16 NOTE — Progress Notes (Signed)
    [name] is readied for DC as evidenced by her completing her daily assessment and on it she wrote she denied SI  And she rated her depression, hopelessness and anxiety " 1/0/1", respectively. She asks over and over and over " do I have to take sliding scale when I go home?". Pt's DC AVS is reviewed with pt and she states understanding and willingness to comply. .She says she knows how to check her cbg's and monitor her carbohydrate intake  . All belongings are returned to her and she is escorted to bldg entrance and dc'd per protocol.

## 2015-08-16 NOTE — Plan of Care (Signed)
Problem: Alteration in mood Goal: LTG-Patient reports reduction in suicidal thoughts (Patient reports reduction in suicidal thoughts and is able to verbalize a safety plan for whenever patient is feeling suicidal)  Outcome: Progressing Patient currently denied suicidal ideation  Problem: Alteration in mood & ability to function due to Goal: LTG-Pt reports reduction in suicidal thoughts (Patient reports reduction in suicidal thoughts and is able to verbalize a safety plan for whenever patient is feeling suicidal)  Outcome: Progressing Patient currently denies suicidal ideations.
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
## 2015-08-17 LAB — GLUCOSE, CAPILLARY
Glucose-Capillary: 199 mg/dL — ABNORMAL HIGH (ref 65–99)
Glucose-Capillary: 138 mg/dL — ABNORMAL HIGH (ref 65–99)
Glucose-Capillary: 166 mg/dL — ABNORMAL HIGH (ref 65–99)

## 2015-08-17 LAB — PROLACTIN: Prolactin: 92.8 ng/mL — ABNORMAL HIGH (ref 4.8–23.3)

## 2015-10-23 ENCOUNTER — Emergency Department (HOSPITAL_COMMUNITY)
Admission: EM | Admit: 2015-10-23 | Discharge: 2015-10-24 | Discharge status: Against Medical Advice | Payer: Medicaid Other | Attending: Emergency Medicine | Admitting: Emergency Medicine

## 2015-10-23 ENCOUNTER — Ambulatory Visit (HOSPITAL_COMMUNITY)
Admission: RE | Admit: 2015-10-23 | Discharge: 2015-10-23 | Disposition: A | Discharge status: Home / Self Care | Payer: Medicaid Other | Attending: Psychiatry | Admitting: Psychiatry

## 2015-10-23 ENCOUNTER — Encounter (HOSPITAL_COMMUNITY): Payer: Self-pay | Admitting: Emergency Medicine

## 2015-10-23 DIAGNOSIS — E669 Obesity, unspecified: Secondary | ICD-10-CM | POA: Insufficient documentation

## 2015-10-23 DIAGNOSIS — F319 Bipolar disorder, unspecified: Secondary | ICD-10-CM | POA: Diagnosis not present

## 2015-10-23 DIAGNOSIS — Z872 Personal history of diseases of the skin and subcutaneous tissue: Secondary | ICD-10-CM | POA: Insufficient documentation

## 2015-10-23 DIAGNOSIS — F419 Anxiety disorder, unspecified: Secondary | ICD-10-CM | POA: Insufficient documentation

## 2015-10-23 DIAGNOSIS — F209 Schizophrenia, unspecified: Secondary | ICD-10-CM | POA: Insufficient documentation

## 2015-10-23 DIAGNOSIS — Z8632 Personal history of gestational diabetes: Secondary | ICD-10-CM | POA: Insufficient documentation

## 2015-10-23 DIAGNOSIS — F919 Conduct disorder, unspecified: Secondary | ICD-10-CM | POA: Diagnosis not present

## 2015-10-23 DIAGNOSIS — F17210 Nicotine dependence, cigarettes, uncomplicated: Secondary | ICD-10-CM | POA: Insufficient documentation

## 2015-10-23 DIAGNOSIS — Z7984 Long term (current) use of oral hypoglycemic drugs: Secondary | ICD-10-CM | POA: Diagnosis not present

## 2015-10-23 DIAGNOSIS — Z793 Long term (current) use of hormonal contraceptives: Secondary | ICD-10-CM | POA: Insufficient documentation

## 2015-10-23 DIAGNOSIS — F329 Major depressive disorder, single episode, unspecified: Secondary | ICD-10-CM | POA: Diagnosis not present

## 2015-10-23 DIAGNOSIS — Z833 Family history of diabetes mellitus: Secondary | ICD-10-CM | POA: Insufficient documentation

## 2015-10-23 DIAGNOSIS — Z8249 Family history of ischemic heart disease and other diseases of the circulatory system: Secondary | ICD-10-CM | POA: Insufficient documentation

## 2015-10-23 DIAGNOSIS — Z79899 Other long term (current) drug therapy: Secondary | ICD-10-CM | POA: Insufficient documentation

## 2015-10-23 DIAGNOSIS — Z8709 Personal history of other diseases of the respiratory system: Secondary | ICD-10-CM | POA: Diagnosis not present

## 2015-10-23 DIAGNOSIS — Z818 Family history of other mental and behavioral disorders: Secondary | ICD-10-CM | POA: Insufficient documentation

## 2015-10-23 DIAGNOSIS — Z9851 Tubal ligation status: Secondary | ICD-10-CM | POA: Insufficient documentation

## 2015-10-23 DIAGNOSIS — Z008 Encounter for other general examination: Secondary | ICD-10-CM | POA: Diagnosis present

## 2015-10-23 NOTE — ED Provider Notes (Signed)
CSN: [id number]     Arrival date & time 10/23/15  2253 History  By signing my name below, I, [facility], attest that this documentation has been prepared under the direction and in the presence of [employer], PA-C. Electronically Signed: Virgel Bouquet, ED Scribe. 10/23/2015. 5:43 AM.   Chief Complaint  Patient presents with  . Medical Clearance    The history is provided by the patient. No language interpreter was used.   HPI Comments: [name] is a 40 y.o. female with an hx of depressive disorder who presents to the Emergency Department for medical clearance. Patient reports that she was seen by behavioral health earlier today. Denies SI, HI, depression. Patient evaluated tonight for schizophrenia and irrational thoughts.  Past Medical History  Diagnosis Date  . Hidradenitis suppurativa   . Depressive disorder, not elsewhere classified   . Tobacco abuse   . Bipolar 1 disorder ([place])   . Obesity   . Gestational diabetes   . Bronchitis    Past Surgical History  Procedure Laterality Date  . Tubal ligation     Family History  Problem Relation Age of Onset  . Hypertension Mother   . Schizophrenia Mother   . Hypertension Father   . Diabetes Father    Social History  Substance Use Topics  . Smoking status: Current Every Day Smoker -- 1.00 packs/day    Types: Cigarettes  . Smokeless tobacco: None  . Alcohol Use: 0.0 oz/week    0 Standard drinks or equivalent per week   OB History    No data available      Review of Systems  Psychiatric/Behavioral: Positive for behavioral problems. Negative for suicidal ideas.  All other systems reviewed and are negative.   Allergies  Review of patient's allergies indicates no known allergies.  Home Medications   Prior to Admission medications   Medication Sig Start Date End Date Taking? Authorizing Provider  albuterol (PROVENTIL HFA;VENTOLIN HFA) 108 (90 BASE) MCG/ACT inhaler Inhale 2 puffs into the lungs every  6 (six) hours as needed for wheezing or shortness of breath. 02/22/14  Yes Bernadene Bell, MD  escitalopram (LEXAPRO) 20 MG tablet Take 1 tablet by mouth daily. 09/25/15  Yes Historical Provider, MD  glipiZIDE (GLUCOTROL) 10 MG tablet Take 1 tablet (10 mg total) by mouth 2 (two) times daily before a meal. 07/04/15  Yes Clayton Bibles, PA-C  hydrOXYzine (ATARAX/VISTARIL) 25 MG tablet Take 1 tablet (25 mg total) by mouth every 6 (six) hours as needed for anxiety. 08/16/15  Yes Derrill Center, NP  lamoTRIgine (LAMICTAL) 200 MG tablet Take 1 tablet (200 mg total) by mouth daily. 08/16/15  Yes Derrill Center, NP  levonorgestrel-ethinyl estradiol (SEASONALE,INTROVALE,JOLESSA) 0.15-0.03 MG tablet Take 1 tablet by mouth daily. 08/06/15  Yes Historical Provider, MD  meloxicam (MOBIC) 15 MG tablet Take 15 mg by mouth daily. 09/25/15  Yes Historical Provider, MD  metFORMIN (GLUCOPHAGE) 500 MG tablet Take 1 tablet (500 mg total) by mouth 2 (two) times daily with a meal. 08/16/15  Yes Derrill Center, NP  risperiDONE (RISPERDAL M-TABS) 3 MG disintegrating tablet Take 1 tablet (3 mg total) by mouth at bedtime. 08/16/15  Yes Derrill Center, NP  zolpidem (AMBIEN) 10 MG tablet Take 5 mg by mouth at bedtime as needed for sleep.  09/25/15  Yes Historical Provider, MD  glucose blood (ONE TOUCH TEST STRIPS) test strip Use as instructed 07/22/15   Veatrice Bourbon, MD  Lancets (ONETOUCH ULTRASOFT) lancets  Use as instructed. 06/18/14   Bernadene Bell, MD  metFORMIN (GLUCOPHAGE) 500 MG tablet Take 1 tablet (500 mg total) by mouth 2 (two) times daily with a meal. Patient not taking: Reported on 10/24/2015 07/04/15   Clayton Bibles, PA-C  nicotine (NICODERM CQ - DOSED IN MG/24 HOURS) 14 mg/24hr patch Place 1 patch (14 mg total) onto the skin daily. Patient not taking: Reported on 10/24/2015 08/16/15   Derrill Center, NP  pravastatin (PRAVACHOL) 20 MG tablet Take 1 tablet (20 mg total) by mouth daily. Patient not taking: Reported on 07/04/2015  02/22/14   Bernadene Bell, MD   BP 145/98 mmHg  Pulse 81  Temp(Src) 98.1 F (36.7 C) (Oral)  Ht 5\' 2"  (1.575 m)  Wt 73.936 kg  BMI 29.81 kg/m2  SpO2 100%   Physical Exam  Constitutional: She is oriented to person, place, and time. She appears well-developed and well-nourished. No distress.  HENT:  Head: Normocephalic and atraumatic.  Eyes: Conjunctivae and EOM are normal. No scleral icterus.  Neck: Normal range of motion.  Pulmonary/Chest: Effort normal. No respiratory distress.  Musculoskeletal: Normal range of motion.  Neurological: She is alert and oriented to person, place, and time.  Skin: Skin is warm and dry. No rash noted. She is not diaphoretic. No erythema. No pallor.  Psychiatric: Her speech is normal. Her mood appears anxious. She is agitated. Cognition and memory are normal. She expresses no homicidal and no suicidal ideation.  Nursing note and vitals reviewed.   ED Course  Procedures   DIAGNOSTIC STUDIES: Oxygen Saturation is 100% on RA, normal by my interpretation.    COORDINATION OF CARE: 11:55 PM Will order CBG. Discussed treatment plan with pt at bedside and pt agreed to plan.   Labs Review Labs Reviewed  COMPREHENSIVE METABOLIC PANEL - Abnormal; Notable for the following:    Potassium 3.0 (*)    Glucose, Bld 213 (*)    ALT 12 (*)    All other components within normal limits  CBC - Abnormal; Notable for the following:    WBC 11.9 (*)    RBC 5.15 (*)    MCV 73.8 (*)    MCHC 37.0 (*)    Platelets 424 (*)    All other components within normal limits  CBG MONITORING, ED - Abnormal; Notable for the following:    Glucose-Capillary 196 (*)    All other components within normal limits  ETHANOL  CBG MONITORING, ED    Imaging Review No results found.   I have personally reviewed and evaluated these images and lab results as part of my medical decision-making.   EKG Interpretation None      MDM   Final diagnoses:  Schizophrenia, unspecified  type ([place])    40 year old female presents to the emergency department for medical clearance. She was evaluated by TTS at [facility] who recommended psychiatric evaluation in the morning. Patient denied suicidal and homicidal ideations during my encounter and while in triage. Shortly after ED arrival, patient left AMA as she was not willing to wait for a doctor to come talk with her.  I personally performed the services described in this documentation, which was scribed in my presence. The recorded information has been reviewed and is accurate.    Filed Vitals:   10/23/15 2311  BP: 145/98  Pulse: 81  Temp: 98.1 F (36.7 C)  TempSrc: Oral  Height: 5\' 2"  (1.575 m)  Weight: 73.936 kg  SpO2: 100%  Antonietta Breach, PA-C 10/24/15 [id number]  April Palumbo, MD 10/24/15 [phone]

## 2015-10-23 NOTE — ED Notes (Signed)
Pt was sent from [facility] for med clearance
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
## 2015-10-24 LAB — COMPREHENSIVE METABOLIC PANEL
ALK PHOS: 55 U/L (ref 38–126)
ALT: 12 U/L — AB (ref 14–54)
ANION GAP: 13 (ref 5–15)
AST: 16 U/L (ref 15–41)
Albumin: 4.1 g/dL (ref 3.5–5.0)
BILIRUBIN TOTAL: 0.8 mg/dL (ref 0.3–1.2)
BUN: 7 mg/dL (ref 6–20)
CALCIUM: 9.2 mg/dL (ref 8.9–10.3)
CO2: 22 mmol/L (ref 22–32)
Chloride: 102 mmol/L (ref 101–111)
Creatinine, Ser: 0.69 mg/dL (ref 0.44–1.00)
Glucose, Bld: 213 mg/dL — ABNORMAL HIGH (ref 65–99)
Potassium: 3 mmol/L — ABNORMAL LOW (ref 3.5–5.1)
SODIUM: 137 mmol/L (ref 135–145)
TOTAL PROTEIN: 7.6 g/dL (ref 6.5–8.1)

## 2015-10-24 LAB — CBC
HCT: 38 % (ref 36.0–46.0)
Hemoglobin: 14.5 g/dL (ref 12.0–15.0)
MCH: 28.2 pg (ref 26.0–34.0)
MCHC: 37 g/dL — ABNORMAL HIGH (ref 30.0–36.0)
MCV: 73.8 fL — ABNORMAL LOW (ref 78.0–100.0)
PLATELETS: 424 10*3/uL — AB (ref 150–400)
RBC: 5.15 MIL/uL — AB (ref 3.87–5.11)
RDW: 15.2 % (ref 11.5–15.5)
WBC: 11.9 10*3/uL — AB (ref 4.0–10.5)

## 2015-10-24 LAB — ETHANOL: Alcohol, Ethyl (B): <5 mg/dL (ref <5)

## 2015-10-24 LAB — CBG MONITORING, ED: Glucose-Capillary: 196 mg/dL — ABNORMAL HIGH (ref 65–99)

## 2015-10-24 MED ORDER — ZOLPIDEM TARTRATE 5 MG PO TABS: 5.0000 mg | ORAL_TABLET | Freq: Every evening | ORAL | Status: DC | PRN

## 2015-10-24 MED ORDER — ONDANSETRON HCL 4 MG PO TABS: 4.0000 mg | ORAL_TABLET | Freq: Three times a day (TID) | ORAL | Status: DC | PRN

## 2015-10-24 MED ORDER — ACETAMINOPHEN 325 MG PO TABS: 650.0000 mg | ORAL_TABLET | ORAL | Status: DC | PRN

## 2015-10-24 MED ORDER — NICOTINE 21 MG/24HR TD PT24: 21.0000 mg | MEDICATED_PATCH | Freq: Every day | TRANSDERMAL | Status: DC

## 2015-10-24 NOTE — ED Notes (Signed)
Pt came to nurses desk with an attitude and wanted to know what she was waiting on, RN told her she was waiting a room at [facility] per Beverely Low and she wanted her discharge papers, she was told that there were no discharge papers that she was to hold here in the ER until space became available and she said no, that they were going to leave.

## 2015-10-24 NOTE — ED Notes (Addendum)
Pt left AMA. Pt stated she was not willing to wait for a doctor to come talk to her. Pt denies SI/HI. Pt's boyfriend is going to be with her tonight EDP notified

## 2015-10-25 ENCOUNTER — Ambulatory Visit (HOSPITAL_COMMUNITY)
Admission: RE | Admit: 2015-10-25 | Discharge: 2015-10-25 | Disposition: A | Discharge status: Home / Self Care | Payer: Medicaid Other | Attending: Psychiatry | Admitting: Psychiatry

## 2015-10-25 ENCOUNTER — Encounter (HOSPITAL_COMMUNITY): Payer: Self-pay

## 2015-10-25 ENCOUNTER — Emergency Department (HOSPITAL_COMMUNITY)
Admission: EM | Admit: 2015-10-25 | Discharge: 2015-10-27 | Disposition: A | Discharge status: Other Acute Inpatient Hospital | Payer: Medicaid Other | Attending: Emergency Medicine | Admitting: Emergency Medicine

## 2015-10-25 DIAGNOSIS — Z79818 Long term (current) use of other agents affecting estrogen receptors and estrogen levels: Secondary | ICD-10-CM | POA: Insufficient documentation

## 2015-10-25 DIAGNOSIS — F22 Delusional disorders: Secondary | ICD-10-CM | POA: Diagnosis not present

## 2015-10-25 DIAGNOSIS — Z8709 Personal history of other diseases of the respiratory system: Secondary | ICD-10-CM | POA: Diagnosis not present

## 2015-10-25 DIAGNOSIS — Z79899 Other long term (current) drug therapy: Secondary | ICD-10-CM | POA: Diagnosis not present

## 2015-10-25 DIAGNOSIS — F3163 Bipolar disorder, current episode mixed, severe, without psychotic features: Secondary | ICD-10-CM | POA: Diagnosis present

## 2015-10-25 DIAGNOSIS — Z7984 Long term (current) use of oral hypoglycemic drugs: Secondary | ICD-10-CM | POA: Insufficient documentation

## 2015-10-25 DIAGNOSIS — Z791 Long term (current) use of non-steroidal anti-inflammatories (NSAID): Secondary | ICD-10-CM | POA: Insufficient documentation

## 2015-10-25 DIAGNOSIS — E669 Obesity, unspecified: Secondary | ICD-10-CM | POA: Diagnosis not present

## 2015-10-25 DIAGNOSIS — F17210 Nicotine dependence, cigarettes, uncomplicated: Secondary | ICD-10-CM | POA: Insufficient documentation

## 2015-10-25 DIAGNOSIS — R443 Hallucinations, unspecified: Secondary | ICD-10-CM | POA: Diagnosis present

## 2015-10-25 DIAGNOSIS — Z8632 Personal history of gestational diabetes: Secondary | ICD-10-CM | POA: Insufficient documentation

## 2015-10-25 DIAGNOSIS — Z872 Personal history of diseases of the skin and subcutaneous tissue: Secondary | ICD-10-CM | POA: Diagnosis not present

## 2015-10-25 LAB — COMPREHENSIVE METABOLIC PANEL
ALT: 12 U/L — ABNORMAL LOW (ref 14–54)
ANION GAP: 13 (ref 5–15)
AST: 15 U/L (ref 15–41)
Albumin: 4.2 g/dL (ref 3.5–5.0)
Alkaline Phosphatase: 60 U/L (ref 38–126)
BILIRUBIN TOTAL: 0.7 mg/dL (ref 0.3–1.2)
BUN: 9 mg/dL (ref 6–20)
CHLORIDE: 100 mmol/L — AB (ref 101–111)
CO2: 23 mmol/L (ref 22–32)
Calcium: 9.3 mg/dL (ref 8.9–10.3)
Creatinine, Ser: 0.79 mg/dL (ref 0.44–1.00)
Glucose, Bld: 229 mg/dL — ABNORMAL HIGH (ref 65–99)
POTASSIUM: 3.1 mmol/L — AB (ref 3.5–5.1)
Sodium: 136 mmol/L (ref 135–145)
TOTAL PROTEIN: 7.7 g/dL (ref 6.5–8.1)

## 2015-10-25 LAB — CBC
HEMATOCRIT: 37.4 % (ref 36.0–46.0)
Hemoglobin: 14 g/dL (ref 12.0–15.0)
MCH: 27.4 pg (ref 26.0–34.0)
MCHC: 37.4 g/dL — AB (ref 30.0–36.0)
MCV: 73.2 fL — ABNORMAL LOW (ref 78.0–100.0)
PLATELETS: 487 10*3/uL — AB (ref 150–400)
RBC: 5.11 MIL/uL (ref 3.87–5.11)
RDW: 15.1 % (ref 11.5–15.5)
WBC: 13.6 10*3/uL — AB (ref 4.0–10.5)

## 2015-10-25 LAB — ETHANOL

## 2015-10-25 MED ORDER — MELOXICAM 15 MG PO TABS
15.0000 mg | ORAL_TABLET | Freq: Every day | ORAL | Status: DC
  Administered 2015-10-27: 15 mg via ORAL
  Filled 2015-10-25 (×2): qty 1

## 2015-10-25 MED ORDER — ONDANSETRON HCL 4 MG PO TABS: 4.0000 mg | ORAL_TABLET | Freq: Three times a day (TID) | ORAL | Status: DC | PRN

## 2015-10-25 MED ORDER — LAMOTRIGINE 200 MG PO TABS
200.0000 mg | ORAL_TABLET | Freq: Every day | ORAL | Status: DC
  Administered 2015-10-26: 100 mg via ORAL
  Administered 2015-10-27: 200 mg via ORAL
  Filled 2015-10-25 (×2): qty 1

## 2015-10-25 MED ORDER — HALOPERIDOL LACTATE 5 MG/ML IJ SOLN
5.0000 mg | Freq: Once | INTRAMUSCULAR | Status: AC
  Administered 2015-10-26: 5 mg via INTRAMUSCULAR
  Filled 2015-10-25: qty 1

## 2015-10-25 MED ORDER — ESCITALOPRAM OXALATE 10 MG PO TABS
20.0000 mg | ORAL_TABLET | Freq: Every day | ORAL | Status: DC
  Administered 2015-10-25 – 2015-10-26 (×2): 10 mg via ORAL
  Filled 2015-10-25 (×3): qty 2

## 2015-10-25 MED ORDER — GLIPIZIDE 10 MG PO TABS
10.0000 mg | ORAL_TABLET | Freq: Two times a day (BID) | ORAL | Status: DC
  Administered 2015-10-26 – 2015-10-27 (×4): 10 mg via ORAL
  Filled 2015-10-25 (×5): qty 1

## 2015-10-25 MED ORDER — POTASSIUM CHLORIDE CRYS ER 20 MEQ PO TBCR
40.0000 meq | EXTENDED_RELEASE_TABLET | Freq: Once | ORAL | Status: DC
  Filled 2015-10-25 (×2): qty 2

## 2015-10-25 MED ORDER — ZOLPIDEM TARTRATE 5 MG PO TABS
5.0000 mg | ORAL_TABLET | Freq: Every evening | ORAL | Status: DC | PRN
  Filled 2015-10-25: qty 1

## 2015-10-25 MED ORDER — METFORMIN HCL 500 MG PO TABS
500.0000 mg | ORAL_TABLET | Freq: Two times a day (BID) | ORAL | Status: DC
  Administered 2015-10-26 – 2015-10-27 (×4): 500 mg via ORAL
  Filled 2015-10-25 (×5): qty 1

## 2015-10-25 MED ORDER — LORAZEPAM 2 MG/ML IJ SOLN
2.0000 mg | Freq: Once | INTRAMUSCULAR | Status: AC
  Administered 2015-10-26: 2 mg via INTRAMUSCULAR
  Filled 2015-10-25: qty 1

## 2015-10-25 MED ORDER — HYDROXYZINE HCL 25 MG PO TABS
25.0000 mg | ORAL_TABLET | Freq: Four times a day (QID) | ORAL | Status: DC | PRN
Start: 2015-10-25 — End: 2015-10-27
  Administered 2015-10-27: 25 mg via ORAL
  Filled 2015-10-25: qty 1

## 2015-10-25 MED ORDER — ALBUTEROL SULFATE HFA 108 (90 BASE) MCG/ACT IN AERS: 2.0000 | INHALATION_SPRAY | Freq: Four times a day (QID) | RESPIRATORY_TRACT | Status: DC | PRN

## 2015-10-25 MED ORDER — LEVONORGEST-ETH ESTRAD 91-DAY 0.15-0.03 MG PO TABS
1.0000 | ORAL_TABLET | Freq: Every day | ORAL | Status: DC
  Administered 2015-10-27: 1 via ORAL

## 2015-10-25 MED ORDER — NICOTINE 21 MG/24HR TD PT24
21.0000 mg | MEDICATED_PATCH | Freq: Every day | TRANSDERMAL | Status: DC
  Administered 2015-10-27: 21 mg via TRANSDERMAL
  Filled 2015-10-25 (×2): qty 1

## 2015-10-25 MED ORDER — LAMOTRIGINE 200 MG PO TABS
200.0000 mg | ORAL_TABLET | Freq: Every day | ORAL | Status: DC
  Filled 2015-10-25: qty 1

## 2015-10-25 NOTE — ED Notes (Signed)
Patient noted pacing in hall. Pt. Still agitated muttering threats taping on nurses station glass. Q15 minute rounds and monitoring via [employer] to continue.

## 2015-10-25 NOTE — ED Notes (Signed)
Patient is constantly pacing back and fourth. She is mumbling I am going to beat her ass, staring at the writer making mean faces and keep saying obscene words.

## 2015-10-25 NOTE — ED Provider Notes (Signed)
CSN: [id number]     Arrival date & time 10/25/15  1810 History   First MD Initiated Contact with Patient 10/25/15 2001     Chief Complaint  Patient presents with  . Paranoid  . Hallucinations     (Consider location/radiation/quality/duration/timing/severity/associated sxs/prior Treatment) HPI Comments: Patient here complaining of increased hallucinations. Has a history of schizophrenia and was seen recently by the psychiatric service. Denies any current suicidal or homicidal ideations. Has increased paranoia. Was seen at behavior health prior to arrival here where she has been accepted for admission pending a bed being available. Patient has not been very cooperative with this assessment and most information is provided by a relative.  The history is provided by the patient and a relative. The history is limited by the condition of the patient.    Past Medical History  Diagnosis Date  . Hidradenitis suppurativa   . Depressive disorder, not elsewhere classified   . Tobacco abuse   . Bipolar 1 disorder ([place])   . Obesity   . Gestational diabetes   . Bronchitis    Past Surgical History  Procedure Laterality Date  . Tubal ligation     Family History  Problem Relation Age of Onset  . Hypertension Mother   . Schizophrenia Mother   . Hypertension Father   . Diabetes Father    Social History  Substance Use Topics  . Smoking status: Current Every Day Smoker -- 1.00 packs/day    Types: Cigarettes  . Smokeless tobacco: None  . Alcohol Use: 0.0 oz/week    0 Standard drinks or equivalent per week   OB History    No data available     Review of Systems  Unable to perform ROS: Acuity of condition      Allergies  Review of patient's allergies indicates no known allergies.  Home Medications   Prior to Admission medications   Medication Sig Start Date End Date Taking? Authorizing Provider  albuterol (PROVENTIL HFA;VENTOLIN HFA) 108 (90 BASE) MCG/ACT inhaler Inhale 2 puffs  into the lungs every 6 (six) hours as needed for wheezing or shortness of breath. 02/22/14  Yes Bernadene Bell, MD  escitalopram (LEXAPRO) 20 MG tablet Take 1 tablet by mouth daily. 09/25/15  Yes Historical Provider, MD  glipiZIDE (GLUCOTROL) 10 MG tablet Take 1 tablet (10 mg total) by mouth 2 (two) times daily before a meal. 07/04/15  Yes Clayton Bibles, PA-C  hydrOXYzine (ATARAX/VISTARIL) 25 MG tablet Take 1 tablet (25 mg total) by mouth every 6 (six) hours as needed for anxiety. 08/16/15  Yes Derrill Center, NP  lamoTRIgine (LAMICTAL) 200 MG tablet Take 1 tablet (200 mg total) by mouth daily. 08/16/15  Yes Derrill Center, NP  levonorgestrel-ethinyl estradiol (SEASONALE,INTROVALE,JOLESSA) 0.15-0.03 MG tablet Take 1 tablet by mouth daily. 08/06/15  Yes Historical Provider, MD  meloxicam (MOBIC) 15 MG tablet Take 15 mg by mouth daily. 09/25/15  Yes Historical Provider, MD  metFORMIN (GLUCOPHAGE) 500 MG tablet Take 1 tablet (500 mg total) by mouth 2 (two) times daily with a meal. 08/16/15  Yes Derrill Center, NP  zolpidem (AMBIEN) 10 MG tablet Take 5 mg by mouth at bedtime as needed for sleep.  09/25/15  Yes Historical Provider, MD  glucose blood (ONE TOUCH TEST STRIPS) test strip Use as instructed 07/22/15   Veatrice Bourbon, MD  Lancets (ONETOUCH ULTRASOFT) lancets Use as instructed. 06/18/14   Bernadene Bell, MD  metFORMIN (GLUCOPHAGE) 500 MG tablet Take 1 tablet (500  mg total) by mouth 2 (two) times daily with a meal. Patient not taking: Reported on 10/24/2015 07/04/15   Clayton Bibles, PA-C  nicotine (NICODERM CQ - DOSED IN MG/24 HOURS) 14 mg/24hr patch Place 1 patch (14 mg total) onto the skin daily. Patient not taking: Reported on 10/24/2015 08/16/15   Derrill Center, NP  pravastatin (PRAVACHOL) 20 MG tablet Take 1 tablet (20 mg total) by mouth daily. Patient not taking: Reported on 07/04/2015 02/22/14   Bernadene Bell, MD  risperiDONE (RISPERDAL M-TABS) 3 MG disintegrating tablet Take 1 tablet (3 mg total) by mouth  at bedtime. Patient not taking: Reported on 10/25/2015 08/16/15   Derrill Center, NP   BP 155/95 mmHg  Pulse 88  Temp(Src) 98.8 F (37.1 C) (Oral)  Resp 16  SpO2 98% Physical Exam  Constitutional: She is oriented to person, place, and time. She appears well-developed and well-nourished.  Non-toxic appearance. No distress.  HENT:  Head: Normocephalic and atraumatic.  Eyes: Conjunctivae, EOM and lids are normal. Pupils are equal, round, and reactive to light.  Neck: Normal range of motion. Neck supple. No tracheal deviation present. No thyroid mass present.  Cardiovascular: Normal rate, regular rhythm and normal heart sounds.  Exam reveals no gallop.   No murmur heard. Pulmonary/Chest: Effort normal and breath sounds normal. No stridor. No respiratory distress. She has no decreased breath sounds. She has no wheezes. She has no rhonchi. She has no rales.  Abdominal: Soft. Normal appearance and bowel sounds are normal. She exhibits no distension. There is no tenderness. There is no rebound and no CVA tenderness.  Musculoskeletal: Normal range of motion. She exhibits no edema or tenderness.  Neurological: She is alert and oriented to person, place, and time. She has normal strength. No cranial nerve deficit or sensory deficit. GCS eye subscore is 4. GCS verbal subscore is 5. GCS motor subscore is 6.  Skin: Skin is warm and dry. No abrasion and no rash noted.  Psychiatric: Her affect is labile. Her speech is rapid and/or pressured. She is withdrawn.  Nursing note and vitals reviewed.   ED Course  Procedures (including critical care time) Labs Review Labs Reviewed  COMPREHENSIVE METABOLIC PANEL - Abnormal; Notable for the following:    Potassium 3.1 (*)    Chloride 100 (*)    Glucose, Bld 229 (*)    ALT 12 (*)    All other components within normal limits  CBC - Abnormal; Notable for the following:    WBC 13.6 (*)    MCV 73.2 (*)    MCHC 37.4 (*)    Platelets 487 (*)    All other  components within normal limits  ETHANOL  URINE RAPID DRUG SCREEN, HOSP PERFORMED    Imaging Review No results found. I have personally reviewed and evaluated these images and lab results as part of my medical decision-making.   EKG Interpretation None      MDM   Final diagnoses:  None    Patient's potassium has been replenished. She is medically cleared for psychiatric placement    Lacretia Leigh, MD 10/25/15 2024

## 2015-10-25 NOTE — ED Notes (Signed)
Pt. Transferred to [facility] from ED to room 38. Report from [facility]. Pt. Oriented to unit including Q15 minute rounds as well as the security cameras for their protection. Patient is alert and oriented, warm and dry in no acute distress. Patient denies SI, HI, and AVH. Pt. Encouraged to let me know if needs arise.
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
## 2015-10-25 NOTE — ED Notes (Signed)
Patient refused vital she told me to go on about my business and did her her for me to go away

## 2015-10-25 NOTE — ED Notes (Signed)
Pt here from [facility] with her Aunt.  Pt has schizophrenia.  Pt has been off her meds.  Pt here today b/c she is having hallucinations.  Carrying knives and paranoid.  Pt denies SI/HI

## 2015-10-26 DIAGNOSIS — F3163 Bipolar disorder, current episode mixed, severe, without psychotic features: Secondary | ICD-10-CM

## 2015-10-26 LAB — CBG MONITORING, ED
GLUCOSE-CAPILLARY: 184 mg/dL — AB (ref 65–99)
GLUCOSE-CAPILLARY: 229 mg/dL — AB (ref 65–99)
Glucose-Capillary: 189 mg/dL — ABNORMAL HIGH (ref 65–99)

## 2015-10-26 MED ORDER — BENZTROPINE MESYLATE 1 MG PO TABS
0.5000 mg | ORAL_TABLET | Freq: Every day | ORAL | Status: DC
  Administered 2015-10-27: 0.5 mg via ORAL
  Filled 2015-10-26: qty 1

## 2015-10-26 MED ORDER — RISPERIDONE 2 MG PO TBDP
3.0000 mg | ORAL_TABLET | Freq: Every day | ORAL | Status: DC
  Filled 2015-10-26 (×3): qty 1

## 2015-10-26 NOTE — ED Notes (Signed)
Aunt provided List of patients current home meds--Dr Delfino Lovett Pavelock-[phone]:: Lamotrigine 200mg ; Zolpidem tartrate 10mg , Metformin 500mg , Meloxicam 15mg , Cyclobenaparim 10mg , Hydroxyzine Pam 50mg  , Escitalapram 20 mg, Glipizide 10mg .

## 2015-10-26 NOTE — ED Notes (Signed)
Patient noted sleeping in room. No complaints, stable, in no acute distress. Q15 minute rounds and monitoring via Security Cameras to continue.  

## 2015-10-26 NOTE — ED Notes (Signed)
Pt's Aunt into see.  Pt still declines to take any additional medications and reports that she will not take any more meds until she see's her own MD.

## 2015-10-26 NOTE — ED Notes (Addendum)
Jamison DNP into see, pt then layed down

## 2015-10-26 NOTE — ED Notes (Addendum)
Pt agreed to take her home medications but refused to take more that one tablet of the pills-does not take multiple tablets of the meds at home and did not think it was her normal medication.

## 2015-10-26 NOTE — ED Notes (Signed)
Visitor into see

## 2015-10-26 NOTE — Progress Notes (Signed)
Disposition CSW completed patient referrals to the following inpatient psych facilities:  [facility] will continue to follow patient for placement needs.  [place] Disposition CSW [phone]

## 2015-10-26 NOTE — ED Notes (Signed)
On the phone 

## 2015-10-26 NOTE — ED Notes (Signed)
Patient noted in room. No complaints, stable, in no acute distress. Q15 minute rounds and monitoring via Security Cameras to continue.  

## 2015-10-26 NOTE — ED Notes (Signed)
Up to the bathroom 

## 2015-10-26 NOTE — ED Notes (Signed)
Up tot he bathroom to shower and change scrubs 

## 2015-10-26 NOTE — ED Notes (Signed)
Patient was sleeping peacefully for her vitals to be taking. Di not disturb her, will let day shift know when patient wake up to obtain vitals

## 2015-10-26 NOTE — Clinical Social Work Note (Signed)
CSW provided examination paperwork for MD to uphold IVC status.  CSW will fax to magistrate and provide copies to pt chart  .Dede Query, LCSW [facility] Clinical Social Worker - Weekend Coverage cell #: [phone]

## 2015-10-26 NOTE — BHH Counselor (Signed)
IVC completed on the Patient, granted by [employer].

## 2015-10-26 NOTE — ED Notes (Signed)
Pt up in the hall/room angry, insisting on leaving, refusing to take any other medication
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
## 2015-10-26 NOTE — ED Notes (Signed)
Dr Zenia Resides updated with blood sugars.

## 2015-10-26 NOTE — ED Notes (Signed)
Report from [place]. Patient with visitor, respirations regular and unlabored. Q15 minute rounds and security camera observation to continue.

## 2015-10-27 LAB — CBG MONITORING, ED
GLUCOSE-CAPILLARY: 184 mg/dL — AB (ref 65–99)
GLUCOSE-CAPILLARY: 121 mg/dL — AB (ref 65–99)
GLUCOSE-CAPILLARY: 194 mg/dL — AB (ref 65–99)
Glucose-Capillary: 131 mg/dL — ABNORMAL HIGH (ref 65–99)

## 2015-10-27 MED ORDER — MAGNESIUM HYDROXIDE 400 MG/5ML PO SUSP
30.0000 mL | Freq: Every evening | ORAL | Status: DC | PRN
  Administered 2015-10-27: 30 mL via ORAL
  Filled 2015-10-27 (×2): qty 30

## 2015-10-27 MED ORDER — ACETAMINOPHEN 325 MG PO TABS
650.0000 mg | ORAL_TABLET | Freq: Four times a day (QID) | ORAL | Status: DC | PRN
  Administered 2015-10-27 (×2): 650 mg via ORAL
  Filled 2015-10-27 (×2): qty 2

## 2015-10-27 MED ORDER — POTASSIUM CHLORIDE CRYS ER 20 MEQ PO TBCR
40.0000 meq | EXTENDED_RELEASE_TABLET | Freq: Once | ORAL | Status: AC
  Administered 2015-10-27: 40 meq via ORAL
  Filled 2015-10-27: qty 2

## 2015-10-27 NOTE — ED Notes (Signed)
Patient noted sleeping in room. No complaints, stable, in no acute distress. Q15 minute rounds and monitoring via Security Cameras to continue.  

## 2015-10-27 NOTE — ED Notes (Signed)
Pt. C/o constipation.

## 2015-10-27 NOTE — Progress Notes (Signed)
Baxter Flattery at [facility] pt has been accepted for admission by Dr. York Spaniel. Pt can be transferred at [facility] today per Baxter Flattery. Requests report be called at [phone], and due to phone issues today, also provides cell number [phone] in case the first number does not ring through.  Informed [facility] TTS.   Sharren Bridge, MSW, LCSW Clinical Social Work, Disposition  10/27/2015 [phone]

## 2015-10-27 NOTE — BH Assessment (Signed)
Writer reassessed pt this am. Pt isn't cooperative and refuses to answer writer's questions. Pt hostile towards Probation officer. Writer explains that TTS continues to seek placement for pt. Pt says that she is voluntary because "I signed the paper." Writer tries to explain the IVC process and that pt is currently under IVC. Pt disputes this. As Probation officer finishes reassessment, pt follows Probation officer very closely down hall, and pt is cursing at [employer]. Pt unable to be redirected.  Arnold Long, [place] Therapeutic Triage Specialist

## 2015-10-27 NOTE — ED Notes (Signed)
Patient noted in room. No complaints, stable, in no acute distress. Q15 minute rounds and monitoring via Security Cameras to continue.  

## 2015-10-27 NOTE — Progress Notes (Signed)
Pt A & O X 3. Compliant with medications when offered as per [facility] including Potassium 40 meq for K+ level of 3.1.  Cooperative with care at present. Anxious, restless with intermittent pacing this shift. Pt appears preoccupied. Pt is less argumentative, defensive and demanding and verbally abusive in comparison to the beginning of this shift. Denies SI, HI, AVH and pain at this time. Pt has been accepted for admission at [facility] by Dr. Gregary Cromer. Report called to [facility] RN at [phone]. RN did stress the importance to not transport pt till after 7:00pm due to bed availability at the facility. Pt updated on POC (transfer), pt was tearful but in agreement, stated "y'all right" and walked away. Verbal education done of the importance of diet compliance related to elevated CBG level with minimal effect. Encouragement and support provided to this pt throughout this shift. Q 15 minutes checks maintained for safety as ordered without gestures of self harm.

## 2017-07-26 ENCOUNTER — Encounter (HOSPITAL_COMMUNITY): Payer: Self-pay | Admitting: *Deleted

## 2017-07-26 ENCOUNTER — Emergency Department (HOSPITAL_COMMUNITY)
Admission: EM | Admit: 2017-07-26 | Discharge: 2017-07-26 | Disposition: A | Discharge status: Home / Self Care | Payer: Medicaid Other | Attending: Emergency Medicine | Admitting: Emergency Medicine

## 2017-07-26 ENCOUNTER — Emergency Department (HOSPITAL_COMMUNITY): Payer: Medicaid Other

## 2017-07-26 ENCOUNTER — Other Ambulatory Visit: Payer: Self-pay

## 2017-07-26 DIAGNOSIS — F17210 Nicotine dependence, cigarettes, uncomplicated: Secondary | ICD-10-CM | POA: Diagnosis not present

## 2017-07-26 DIAGNOSIS — R0789 Other chest pain: Secondary | ICD-10-CM

## 2017-07-26 DIAGNOSIS — E119 Type 2 diabetes mellitus without complications: Secondary | ICD-10-CM | POA: Diagnosis not present

## 2017-07-26 DIAGNOSIS — R079 Chest pain, unspecified: Secondary | ICD-10-CM | POA: Insufficient documentation

## 2017-07-26 DIAGNOSIS — Z79899 Other long term (current) drug therapy: Secondary | ICD-10-CM | POA: Diagnosis not present

## 2017-07-26 DIAGNOSIS — R0602 Shortness of breath: Secondary | ICD-10-CM | POA: Diagnosis present

## 2017-07-26 DIAGNOSIS — J45909 Unspecified asthma, uncomplicated: Secondary | ICD-10-CM | POA: Diagnosis not present

## 2017-07-26 DIAGNOSIS — IMO0002 Reserved for concepts with insufficient information to code with codable children: Secondary | ICD-10-CM

## 2017-07-26 DIAGNOSIS — E1165 Type 2 diabetes mellitus with hyperglycemia: Secondary | ICD-10-CM

## 2017-07-26 LAB — BASIC METABOLIC PANEL
Anion gap: 10 (ref 5–15)
BUN: 8 mg/dL (ref 6–20)
CHLORIDE: 103 mmol/L (ref 101–111)
CO2: 21 mmol/L — ABNORMAL LOW (ref 22–32)
CREATININE: 0.69 mg/dL (ref 0.44–1.00)
Calcium: 9 mg/dL (ref 8.9–10.3)
GFR calc non Af Amer: >60 mL/min (ref >60)
Glucose, Bld: 286 mg/dL — ABNORMAL HIGH (ref 65–99)
POTASSIUM: 3.2 mmol/L — AB (ref 3.5–5.1)
SODIUM: 134 mmol/L — AB (ref 135–145)

## 2017-07-26 LAB — CBC
HCT: 34.1 % — ABNORMAL LOW (ref 36.0–46.0)
Hemoglobin: 12 g/dL (ref 12.0–15.0)
MCH: 24.3 pg — ABNORMAL LOW (ref 26.0–34.0)
MCHC: 35.2 g/dL (ref 30.0–36.0)
MCV: 69 fL — AB (ref 78.0–100.0)
PLATELETS: 376 10*3/uL (ref 150–400)
RBC: 4.94 MIL/uL (ref 3.87–5.11)
RDW: 15.6 % — ABNORMAL HIGH (ref 11.5–15.5)
WBC: 11.4 10*3/uL — AB (ref 4.0–10.5)

## 2017-07-26 LAB — I-STAT TROPONIN, ED: Troponin i, poc: 0 ng/mL (ref 0.00–0.08)

## 2017-07-26 LAB — HCG, QUANTITATIVE, PREGNANCY

## 2017-07-26 MED ORDER — IPRATROPIUM-ALBUTEROL 0.5-2.5 (3) MG/3ML IN SOLN
3.0000 mL | Freq: Once | RESPIRATORY_TRACT | Status: AC
  Administered 2017-07-26: 3 mL via RESPIRATORY_TRACT
  Filled 2017-07-26: qty 3

## 2017-07-26 MED ORDER — ASPIRIN 81 MG PO CHEW
324.0000 mg | CHEWABLE_TABLET | Freq: Once | ORAL | Status: AC
  Administered 2017-07-26: 324 mg via ORAL
  Filled 2017-07-26: qty 4

## 2017-07-26 MED ORDER — GLIPIZIDE 10 MG PO TABS: 10.0000 mg | ORAL_TABLET | Freq: Two times a day (BID) | ORAL | 0 refills | Status: DC

## 2017-07-26 MED ORDER — ALBUTEROL SULFATE HFA 108 (90 BASE) MCG/ACT IN AERS: 2.0000 | INHALATION_SPRAY | Freq: Four times a day (QID) | RESPIRATORY_TRACT | 2 refills | Status: DC | PRN

## 2017-07-26 MED ORDER — METFORMIN HCL 500 MG PO TABS: 500.0000 mg | ORAL_TABLET | Freq: Two times a day (BID) | ORAL | 0 refills | Status: DC

## 2017-07-26 NOTE — ED Notes (Signed)
Went to give pt discharge paperwork and prescriptions. Pt not in room.

## 2017-07-26 NOTE — ED Provider Notes (Signed)
[facility] EMERGENCY DEPARTMENT Provider Note   CSN: [id number] Arrival date & time: 07/26/17  0301     History   Chief Complaint Chief Complaint  Patient presents with  . Shortness of Breath    HPI CELES DEDIC is a 41 y.o. female.  HPI Patient has history of bipolar disorder, bronchitis, diabetes comes in with chief complaint of chest discomfort. Patient reports that she started having some chest heaviness and pressure-like feeling in the middle the night.  Patient also had associated shortness of breath and wheezing.  Patient denies any associated diaphoresis.  Patient's pain was nonradiating.  Patient denies any history of similar pain in the past.  Patient has no known history of coronary artery disease.  She smokes about half a pack a day, but does not use any drugs. Patient has been having some congestion and URI like symptoms for the past few days.  Patient also reports that she has not been taking medications as she lost insurance, and is requesting prescriptions for her diabetes and psychiatry conditions.  Patient has no SI, HI.  Patient does report that she has been sleeping 3 hours at a time, and feels like her sugar being elevated is making her little manic.  Patient and her friend deny any risky behavior, hallucinations.  Past Medical History:  Diagnosis Date  . Bipolar 1 disorder ([place])   . Bronchitis   . Depressive disorder, not elsewhere classified   . Gestational diabetes   . Hidradenitis suppurativa   . Obesity   . Tobacco abuse     Patient Active Problem List   Diagnosis Date Noted  . Bipolar disorder, curr episode mixed, severe, w/o psychotic features ([place]) 08/16/2015  . Cannabis use disorder, severe, dependence ([place]) 08/13/2015  . Yeast vaginitis 11/07/2014  . Vulvovaginal candidiasis 10/16/2014  . Night sweats 02/22/2014  . 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 Asthma, chronic 03/12/2013  . Hyperlipidemia 12/10/2011  . DM  (diabetes mellitus), type 2, uncontrolled ([place]) 12/07/2011  . HIDRADENITIS SUPPURATIVA 06/12/2009  . OBESITY, UNSPECIFIED 10/31/2008  . BACK PAIN, LUMBAR 10/31/2008  . TOBACCO DEPENDENCE 09/29/2006  . PAPANICOLAOU SMEAR, ABNORMAL 09/29/2006    Past Surgical History:  Procedure Laterality Date  . TUBAL LIGATION      OB History    No data available       Home Medications    Prior to Admission medications   Medication Sig Start Date End Date Taking? Authorizing Provider  albuterol (PROVENTIL HFA;VENTOLIN HFA) 108 (90 BASE) MCG/ACT inhaler Inhale 2 puffs into the lungs every 6 (six) hours as needed for wheezing or shortness of breath. 02/22/14   Bernadene Bell, MD  albuterol (PROVENTIL HFA;VENTOLIN HFA) 108 (90 Base) MCG/ACT inhaler Inhale 2 puffs into the lungs every 6 (six) hours as needed for wheezing or shortness of breath. 07/26/17   Varney Biles, MD  escitalopram (LEXAPRO) 20 MG tablet Take 1 tablet by mouth daily. 09/25/15   [provider]  glipiZIDE (GLUCOTROL) 10 MG tablet Take 1 tablet (10 mg total) by mouth 2 (two) times daily before a meal. 07/26/17   Varney Biles, MD  glucose blood (ONE TOUCH TEST STRIPS) test strip Use as instructed 07/22/15   Haney, Alyssa A, MD  hydrOXYzine (ATARAX/VISTARIL) 25 MG tablet Take 1 tablet (25 mg total) by mouth every 6 (six) hours as needed for anxiety. 08/16/15   Derrill Center, NP  lamoTRIgine (LAMICTAL) 200 MG tablet Take 1 tablet (200 mg total) by mouth daily.  08/16/15   Derrill Center, NP  Lancets [facility] ULTRASOFT) lancets Use as instructed. 06/18/14   Bernadene Bell, MD  levonorgestrel-ethinyl estradiol (SEASONALE,INTROVALE,JOLESSA) 0.15-0.03 MG tablet Take 1 tablet by mouth daily. 08/06/15   [provider]  meloxicam (MOBIC) 15 MG tablet Take 15 mg by mouth daily. 09/25/15   [provider]  metFORMIN (GLUCOPHAGE) 500 MG tablet Take 1 tablet (500 mg total) by mouth 2 (two) times daily with a meal.  08/16/15   Derrill Center, NP  metFORMIN (GLUCOPHAGE) 500 MG tablet Take 1 tablet (500 mg total) by mouth 2 (two) times daily with a meal. 07/26/17   Enijah Furr, MD  nicotine (NICODERM CQ - DOSED IN MG/24 HOURS) 14 mg/24hr patch Place 1 patch (14 mg total) onto the skin daily. Patient not taking: Reported on 10/24/2015 08/16/15   Derrill Center, NP  pravastatin (PRAVACHOL) 20 MG tablet Take 1 tablet (20 mg total) by mouth daily. Patient not taking: Reported on 07/04/2015 02/22/14   Bernadene Bell, MD  risperiDONE (RISPERDAL M-TABS) 3 MG disintegrating tablet Take 1 tablet (3 mg total) by mouth at bedtime. Patient not taking: Reported on 10/25/2015 08/16/15   Derrill Center, NP  zolpidem (AMBIEN) 10 MG tablet Take 5 mg by mouth at bedtime as needed for sleep.  09/25/15   [provider]    Family History Family History  Problem Relation Age of Onset  . Hypertension Mother   . Schizophrenia Mother   . Hypertension Father   . Diabetes Father     Social History Social History   Tobacco Use  . Smoking status: Current Every Day Smoker    Packs/day: 1.00    Types: Cigarettes  . Smokeless tobacco: Never Used  Substance Use Topics  . Alcohol use: Yes    Alcohol/week: 0.0 oz  . Drug use: Yes    Types: Marijuana     Allergies   Patient has no known allergies.   Review of Systems Review of Systems  Constitutional: Positive for activity change.  Respiratory: Positive for cough, chest tightness, shortness of breath and wheezing.   Cardiovascular: Positive for chest pain.  Allergic/Immunologic: Negative for immunocompromised state.  Psychiatric/Behavioral: Positive for sleep disturbance.  All other systems reviewed and are negative.    Physical Exam Updated Vital Signs BP 139/79   Pulse 85   Temp 99.7 F (37.6 C)   Resp (!) 27   LMP 06/29/2017   SpO2 100%   Physical Exam  Constitutional: She is oriented to person, place, and time. She appears well-developed.    HENT:  Head: Normocephalic and atraumatic.  Eyes: EOM are normal.  Neck: Normal range of motion. Neck supple.  Cardiovascular: Normal rate.  Pulmonary/Chest: Effort normal. She has no decreased breath sounds. She has wheezes in the right upper field and the left upper field. She has no rhonchi. She has no rales.  Abdominal: Bowel sounds are normal.  Neurological: She is alert and oriented to person, place, and time.  Skin: Skin is warm and dry.  Psychiatric: She has a normal mood and affect. Her behavior is normal. Her mood appears not anxious. She is not agitated.  Nursing note and vitals reviewed.    ED Treatments / Results  Labs (all labs ordered are listed, but only abnormal results are displayed) Labs Reviewed  BASIC METABOLIC PANEL - Abnormal; Notable for the following components:      Result Value   Sodium 134 (*)  Potassium 3.2 (*)    CO2 21 (*)    Glucose, Bld 286 (*)    All other components within normal limits  CBC - Abnormal; Notable for the following components:   WBC 11.4 (*)    HCT 34.1 (*)    MCV 69.0 (*)    MCH 24.3 (*)    RDW 15.6 (*)    All other components within normal limits  HCG, QUANTITATIVE, PREGNANCY  I-STAT TROPONIN, ED  I-STAT TROPONIN, ED    EKG  EKG Interpretation  Date/Time:  Tuesday July 26 2017 03:06:37 EST Ventricular Rate:  109 PR Interval:  134 QRS Duration: 80 QT Interval:  316 QTC Calculation: 425 R Axis:   72 Text Interpretation:  Sinus tachycardia Possible Left atrial enlargement Borderline ECG new TWI in the infeiror leads Confirmed by Varney Biles [phone]) on 07/26/2017 5:02:52 AM       Radiology Dg Chest 2 View  Result Date: 07/26/2017 CLINICAL DATA:  41 year old female with shortness of breath. EXAM: CHEST  2 VIEW COMPARISON:  Chest radiograph dated 05/31/2011 FINDINGS: The heart size and mediastinal contours are within normal limits. Both lungs are clear. The visualized skeletal structures are unremarkable.  IMPRESSION: No active cardiopulmonary disease. Electronically Signed   By: Anner Crete M.D.   On: 07/26/2017 03:52    Procedures Procedures (including critical care time)  Medications Ordered in ED Medications  ipratropium-albuterol (DUONEB) 0.5-2.5 (3) MG/3ML nebulizer solution 3 mL (3 mLs Nebulization Given 07/26/17 0559)  aspirin chewable tablet 324 mg (324 mg Oral Given 07/26/17 0600)     Initial Impression / Assessment and Plan / ED Course  I have reviewed the triage vital signs and the nursing notes.  Pertinent labs & imaging results that were available during my care of the patient were reviewed by me and considered in my medical decision making (see chart for details).  Clinical Course as of Jul 26 652  Tue Jul 26, 2017  0646 Patient is now refusing second troponin.  She reports that Medicaid will charge her, and therefore she does not want a troponin.  I informed patient that if she has Medicaid for her ED visit will be covered.  In addition I informed her that second troponin is ordered to ensure that she did not have any cardiac damage prior to ER arrival.  Patient wants not get troponin against medical advice. Patient understands that her actions will lead to inadequate medical workup, and that she is at risk of complications of missed diagnosis, which includes morbidity and mortality.  Alternative options discussed. Opportunity to change mind given. Discussion witnessed by lab tech. Patient is demonstrating good capacity to make decision. Patient understands that she needs to return to the ER immediately if his/her symptoms get worse.    [AN]  [phone] Registration reports that the patient will be self-pay. We will abide by patient's wishes and not get a second trop. Strict ER return precautions have been discussed. She is medications for diabetes and inhaler have been provided.  [AN]    Clinical Course User Index [AN] Varney Biles, MD    Patient comes in  to the ER with chief complaint of chest discomfort. Patient had a faint wheeze on my exam.  We will give her nebulizer treatment.  Patient describes the chest pain as tightness and heaviness.  Patient has poorly controlled diabetes, and she is a smoker.  We will get delta troponin.  EKG is reassuring.  Heart score is 2.  My  impression is that patient's pain is likely related to viral bronchitis.  Patient is also stating that she has not been sleeping well and not been taking her medications.  On exam she does not appear decompensated.  I informed patient that she can stay in the ER and get staffed by psychiatrist in the morning and be restarted on her psych medications.  Patient has not taken his psych medications for close to a year now, and I do not feel comfortable starting on her medications for which I do not know the side effects.  Patient reports that she has insurance now and she will figure out a follow-up with psych services.  Patient prefers not seeing [facility] for personal reasons.  Patient's blood sugar is slightly elevated, however she is not in DKA.  I will be giving her metformin and glipizide that she is normally scheduled to take.  Final Clinical Impressions(s) / ED Diagnoses   Final diagnoses:  Chest discomfort    ED Discharge Orders        Ordered    metFORMIN (GLUCOPHAGE) 500 MG tablet  2 times daily with meals     07/26/17 0649    glipiZIDE (GLUCOTROL) 10 MG tablet  2 times daily before meals     07/26/17 0649    albuterol (PROVENTIL HFA;VENTOLIN HFA) 108 (90 Base) MCG/ACT inhaler  Every 6 hours PRN     07/26/17 0649       Varney Biles, MD 07/26/17 [phone]

## 2017-07-26 NOTE — ED Notes (Signed)
Pt refused blood draw due to self pay.  EDP notified.

## 2017-07-26 NOTE — ED Notes (Signed)
Patient denies pain and is resting comfortably.  

## 2017-07-26 NOTE — Discharge Instructions (Signed)
Please take the medicines prescribed. Please see a primary care doctor and a psychiatrist for optimal management of your medical and psychiatric conditions.  Please return to the ER if you have worsening chest pain, shortness of breath, pain radiating to your jaw, shoulder, or back, sweats or fainting. Otherwise see the Cardiologist or your primary care doctor as requested.

## 2017-07-26 NOTE — ED Triage Notes (Signed)
Pt c/o chest heaviness and tightness with sob. Reports nonproductive cough and cold-like symptoms for the past week. Also reports hyperglycemia with CBGs above 300.

## 2017-08-28 ENCOUNTER — Encounter (HOSPITAL_COMMUNITY): Payer: Self-pay

## 2017-08-28 ENCOUNTER — Emergency Department (HOSPITAL_COMMUNITY)
Admission: EM | Admit: 2017-08-28 | Discharge: 2017-08-28 | Disposition: A | Discharge status: Home / Self Care | Payer: Medicaid Other | Attending: Emergency Medicine | Admitting: Emergency Medicine

## 2017-08-28 DIAGNOSIS — J45909 Unspecified asthma, uncomplicated: Secondary | ICD-10-CM | POA: Insufficient documentation

## 2017-08-28 DIAGNOSIS — E119 Type 2 diabetes mellitus without complications: Secondary | ICD-10-CM | POA: Diagnosis not present

## 2017-08-28 DIAGNOSIS — N764 Abscess of vulva: Secondary | ICD-10-CM | POA: Diagnosis not present

## 2017-08-28 DIAGNOSIS — F17210 Nicotine dependence, cigarettes, uncomplicated: Secondary | ICD-10-CM | POA: Insufficient documentation

## 2017-08-28 DIAGNOSIS — Z7984 Long term (current) use of oral hypoglycemic drugs: Secondary | ICD-10-CM | POA: Insufficient documentation

## 2017-08-28 DIAGNOSIS — Z79899 Other long term (current) drug therapy: Secondary | ICD-10-CM | POA: Diagnosis not present

## 2017-08-28 LAB — I-STAT CG4 LACTIC ACID, ED: Lactic Acid, Venous: 1.75 mmol/L (ref 0.5–1.9)

## 2017-08-28 LAB — CBC WITH DIFFERENTIAL/PLATELET
BASOS ABS: 0 10*3/uL (ref 0.0–0.1)
BASOS PCT: 0 %
Eosinophils Absolute: 0.1 10*3/uL (ref 0.0–0.7)
Eosinophils Relative: 1 %
HEMATOCRIT: 36.1 % (ref 36.0–46.0)
HEMOGLOBIN: 12.6 g/dL (ref 12.0–15.0)
LYMPHS ABS: 2.9 10*3/uL (ref 0.7–4.0)
LYMPHS PCT: 32 %
MCH: 24.1 pg — AB (ref 26.0–34.0)
MCHC: 34.9 g/dL (ref 30.0–36.0)
MCV: 69.2 fL — AB (ref 78.0–100.0)
MONOS PCT: 6 %
Monocytes Absolute: 0.5 10*3/uL (ref 0.1–1.0)
NEUTROS ABS: 5.6 10*3/uL (ref 1.7–7.7)
Neutrophils Relative %: 61 %
Platelets: 453 10*3/uL — ABNORMAL HIGH (ref 150–400)
RBC: 5.22 MIL/uL — ABNORMAL HIGH (ref 3.87–5.11)
RDW: 16.3 % — AB (ref 11.5–15.5)
WBC: 9.1 10*3/uL (ref 4.0–10.5)

## 2017-08-28 LAB — COMPREHENSIVE METABOLIC PANEL
ALBUMIN: 3.7 g/dL (ref 3.5–5.0)
ALK PHOS: 69 U/L (ref 38–126)
ALT: 10 U/L — ABNORMAL LOW (ref 14–54)
ANION GAP: 11 (ref 5–15)
AST: 18 U/L (ref 15–41)
BILIRUBIN TOTAL: 0.4 mg/dL (ref 0.3–1.2)
BUN: 8 mg/dL (ref 6–20)
CO2: 22 mmol/L (ref 22–32)
Calcium: 9.1 mg/dL (ref 8.9–10.3)
Chloride: 100 mmol/L — ABNORMAL LOW (ref 101–111)
Creatinine, Ser: 0.77 mg/dL (ref 0.44–1.00)
GFR calc Af Amer: >60 mL/min (ref >60)
GFR calc non Af Amer: >60 mL/min (ref >60)
GLUCOSE: 352 mg/dL — AB (ref 65–99)
POTASSIUM: 4.3 mmol/L (ref 3.5–5.1)
SODIUM: 133 mmol/L — AB (ref 135–145)
TOTAL PROTEIN: 7.1 g/dL (ref 6.5–8.1)

## 2017-08-28 LAB — POC URINE PREG, ED: Preg Test, Ur: NEGATIVE

## 2017-08-28 LAB — I-STAT BETA HCG BLOOD, ED (MC, WL, AP ONLY): I-stat hCG, quantitative: 6.3 m[IU]/mL — ABNORMAL HIGH (ref <5)

## 2017-08-28 MED ORDER — CEPHALEXIN 500 MG PO CAPS: 500.0000 mg | ORAL_CAPSULE | Freq: Three times a day (TID) | ORAL | 0 refills | Status: AC

## 2017-08-28 MED ORDER — LIDOCAINE-EPINEPHRINE 2 %-1:100000 IJ SOLN
20.0000 mL | Freq: Once | INTRAMUSCULAR | Status: AC
  Administered 2017-08-28: 20 mL via INTRADERMAL
  Filled 2017-08-28: qty 20

## 2017-08-28 NOTE — ED Notes (Signed)
Urine cup given to patient.

## 2017-08-28 NOTE — ED Provider Notes (Signed)
[facility] EMERGENCY DEPARTMENT Provider Note  CSN: [id number] Arrival date & time: 08/28/17 1121  Chief Complaint(s) Abscess  HPI [name] is a 42 y.o. female   The history is provided by the patient.  Abscess  Location:  Pelvis Pelvic abscess location:  Vulva Size:  1.5 cm Abscess quality: fluctuance and painful   Red streaking: no   Duration: several days. Progression:  Worsening Pain details:    Quality:  Pressure and throbbing   Severity:  Moderate   Timing:  Constant   Progression:  Worsening Chronicity:  New Context: diabetes   Relieved by:  Nothing Exacerbated by: palpation. Associated symptoms: no fever     Past Medical History Past Medical History:  Diagnosis Date  . Bipolar 1 disorder ([place])   . Bronchitis   . Depressive disorder, not elsewhere classified   . Gestational diabetes   . Hidradenitis suppurativa   . Obesity   . Tobacco abuse    Patient Active Problem List   Diagnosis Date Noted  . Bipolar disorder, curr episode mixed, severe, w/o psychotic features ([place]) 08/16/2015  . Cannabis use disorder, severe, dependence ([place]) 08/13/2015  . Yeast vaginitis 11/07/2014  . Vulvovaginal candidiasis 10/16/2014  . Night sweats 02/22/2014  . 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 Asthma, chronic 03/12/2013  . Hyperlipidemia 12/10/2011  . DM (diabetes mellitus), type 2, uncontrolled ([place]) 12/07/2011  . HIDRADENITIS SUPPURATIVA 06/12/2009  . OBESITY, UNSPECIFIED 10/31/2008  . BACK PAIN, LUMBAR 10/31/2008  . TOBACCO DEPENDENCE 09/29/2006  . PAPANICOLAOU SMEAR, ABNORMAL 09/29/2006   Home Medication(s) Prior to Admission medications   Medication Sig Start Date End Date Taking? Authorizing Provider  albuterol (PROVENTIL HFA;VENTOLIN HFA) 108 (90 BASE) MCG/ACT inhaler Inhale 2 puffs into the lungs every 6 (six) hours as needed for wheezing or shortness of breath. 02/22/14   Bernadene Bell, MD  albuterol (PROVENTIL HFA;VENTOLIN HFA) 108  (90 Base) MCG/ACT inhaler Inhale 2 puffs into the lungs every 6 (six) hours as needed for wheezing or shortness of breath. 07/26/17   Varney Biles, MD  cephALEXin (KEFLEX) 500 MG capsule Take 1 capsule (500 mg total) by mouth 3 (three) times daily for 5 days. 08/28/17 09/02/17  Fatima Blank, MD  escitalopram (LEXAPRO) 20 MG tablet Take 1 tablet by mouth daily. 09/25/15   [provider]  glipiZIDE (GLUCOTROL) 10 MG tablet Take 1 tablet (10 mg total) by mouth 2 (two) times daily before a meal. 07/26/17   Varney Biles, MD  glucose blood (ONE TOUCH TEST STRIPS) test strip Use as instructed 07/22/15   Haney, Alyssa A, MD  hydrOXYzine (ATARAX/VISTARIL) 25 MG tablet Take 1 tablet (25 mg total) by mouth every 6 (six) hours as needed for anxiety. 08/16/15   Derrill Center, NP  lamoTRIgine (LAMICTAL) 200 MG tablet Take 1 tablet (200 mg total) by mouth daily. 08/16/15   Derrill Center, NP  Lancets [facility] ULTRASOFT) lancets Use as instructed. 06/18/14   Bernadene Bell, MD  levonorgestrel-ethinyl estradiol (SEASONALE,INTROVALE,JOLESSA) 0.15-0.03 MG tablet Take 1 tablet by mouth daily. 08/06/15   [provider]  meloxicam (MOBIC) 15 MG tablet Take 15 mg by mouth daily. 09/25/15   [provider]  metFORMIN (GLUCOPHAGE) 500 MG tablet Take 1 tablet (500 mg total) by mouth 2 (two) times daily with a meal. 08/16/15   Derrill Center, NP  metFORMIN (GLUCOPHAGE) 500 MG tablet Take 1 tablet (500 mg total) by mouth 2 (two) times daily with a meal. 07/26/17  Varney Biles, MD  nicotine (NICODERM CQ - DOSED IN MG/24 HOURS) 14 mg/24hr patch Place 1 patch (14 mg total) onto the skin daily. Patient not taking: Reported on 10/24/2015 08/16/15   Derrill Center, NP  pravastatin (PRAVACHOL) 20 MG tablet Take 1 tablet (20 mg total) by mouth daily. Patient not taking: Reported on 07/04/2015 02/22/14   Bernadene Bell, MD  risperiDONE (RISPERDAL M-TABS) 3 MG disintegrating tablet Take 1  tablet (3 mg total) by mouth at bedtime. Patient not taking: Reported on 10/25/2015 08/16/15   Derrill Center, NP  zolpidem (AMBIEN) 10 MG tablet Take 5 mg by mouth at bedtime as needed for sleep.  09/25/15   [provider]                                                                                                                                    Past Surgical History Past Surgical History:  Procedure Laterality Date  . TUBAL LIGATION     Family History Family History  Problem Relation Age of Onset  . Hypertension Mother   . Schizophrenia Mother   . Hypertension Father   . Diabetes Father     Social History Social History   Tobacco Use  . Smoking status: Current Every Day Smoker    Packs/day: 1.00    Types: Cigarettes  . Smokeless tobacco: Never Used  Substance Use Topics  . Alcohol use: Yes    Alcohol/week: 0.0 oz  . Drug use: Yes    Types: Marijuana   Allergies Patient has no known allergies.  Review of Systems Review of Systems  Constitutional: Negative for fever.  Genitourinary: Positive for vaginal bleeding (on her menstrual cycle). Negative for dysuria and vaginal discharge.   All other systems are reviewed and are negative for acute change except as noted in the HPI  Physical Exam Vital Signs  I have reviewed the triage vital signs BP 110/78 (BP Location: Right Arm)   Pulse 75   Temp 98.3 F (36.8 C) (Oral)   Resp 16   Ht 5' 2.5" (1.588 m)   Wt 68 kg (150 lb)   SpO2 99%   BMI 27.00 kg/m   Physical Exam  Constitutional: She is oriented to person, place, and time. She appears well-developed and well-nourished. No distress.  HENT:  Head: Normocephalic and atraumatic.  Right Ear: External ear normal.  Left Ear: External ear normal.  Nose: Nose normal.  Eyes: Conjunctivae and EOM are normal. No scleral icterus.  Neck: Normal range of motion and phonation normal.  Cardiovascular: Normal rate and regular rhythm.  Pulmonary/Chest: Effort  normal. No stridor. No respiratory distress.  Abdominal: She exhibits no distension.  Genitourinary:  Genitourinary Comments: Chaperone present during pelvic exam.  1.5 cm abscess to right external labia major with underlying fluctuance. No erythema or streaking. TTP.  Vaginal bleeding; tampon string visible externally  Musculoskeletal: Normal range of motion.  She exhibits no edema.  Neurological: She is alert and oriented to person, place, and time.  Skin: She is not diaphoretic.  Psychiatric: She has a normal mood and affect. Her behavior is normal.  Vitals reviewed.   ED Results and Treatments Labs (all labs ordered are listed, but only abnormal results are displayed) Labs Reviewed  COMPREHENSIVE METABOLIC PANEL - Abnormal; Notable for the following components:      Result Value   Sodium 133 (*)    Chloride 100 (*)    Glucose, Bld 352 (*)    ALT 10 (*)    All other components within normal limits  CBC WITH DIFFERENTIAL/PLATELET - Abnormal; Notable for the following components:   RBC 5.22 (*)    MCV 69.2 (*)    MCH 24.1 (*)    RDW 16.3 (*)    Platelets 453 (*)    All other components within normal limits  I-STAT BETA HCG BLOOD, ED ([facility], [facility], AP ONLY) - Abnormal; Notable for the following components:   I-stat hCG, quantitative 6.3 (*)    All other components within normal limits  I-STAT CG4 LACTIC ACID, ED  POC URINE PREG, ED  I-STAT CG4 LACTIC ACID, ED                                                                                                                         EKG  EKG Interpretation  Date/Time:    Ventricular Rate:    PR Interval:    QRS Duration:   QT Interval:    QTC Calculation:   R Axis:     Text Interpretation:        Radiology No results found. Pertinent labs & imaging results that were available during my care of the patient were reviewed by me and considered in my medical decision making (see chart for details).  Medications Ordered in  ED Medications  lidocaine-EPINEPHrine (XYLOCAINE W/EPI) 2 %-1:100000 (with pres) injection 20 mL (20 mLs Intradermal Given 08/28/17 1254)                                                                                                                                    Procedures .Marland KitchenIncision and Drainage Date/Time: 08/28/2017 1:19 PM Performed by: Fatima Blank, MD Authorized by: Fatima Blank, MD   Consent:    Consent obtained:  Verbal   Consent given by:  Patient  Risks discussed:  Incomplete drainage and infection   Alternatives discussed:  Delayed treatment and alternative treatment Location:    Type:  Abscess   Size:  1.5   Location:  Anogenital   Anogenital location:  Vulva Pre-procedure details:    Skin preparation:  Betadine Anesthesia (see MAR for exact dosages):    Anesthesia method:  Local infiltration   Local anesthetic:  Lidocaine 2% WITH epi Procedure type:    Complexity:  Simple Procedure details:    Needle aspiration: no     Incision types:  Single straight   Incision depth:  Subcutaneous   Scalpel blade:  11   Wound management:  Probed and deloculated   Drainage:  Purulent and bloody   Drainage amount:  Moderate   Wound treatment:  Wound left open   Packing materials:  1/4 in gauze Post-procedure details:    Patient tolerance of procedure:  Tolerated well, no immediate complications    (including critical care time)  Medical Decision Making / ED Course I have reviewed the nursing notes for this encounter and the patient's prior records (if available in EHR or on provided paperwork).      Incision and drainage as above. Will Rx Abx given her h/o DM.  Patient had labs drawn at triage with mildly elevated beta hCG.  Patient is currently on her menstrual cycle and states that she has been regular.  She believes it is unlikely that she is pregnant.  She denies any abdominal pain at this time.  UPT was obtained which was negative.  Likely  lab error with the beta hCG i-STAT.  However the patient was informed of the results and instructed to follow-up for recheck as needed or if she begins having abdominal pain.  The patient appears reasonably screened and/or stabilized for discharge and I doubt any other medical condition or other [facility] requiring further screening, evaluation, or treatment in the ED at this time prior to discharge.  The patient is safe for discharge with strict return precautions.   Final Clinical Impression(s) / ED Diagnoses Final diagnoses:  Vulvar abscess    Disposition: Discharge  Condition: Good  I have discussed the results, Dx and Tx plan with the patient who expressed understanding and agree(s) with the plan. Discharge instructions discussed at great length. The patient was given strict return precautions who verbalized understanding of the instructions. No further questions at time of discharge.    ED Discharge Orders        Ordered    cephALEXin (KEFLEX) 500 MG capsule  3 times daily     08/28/17 1352       Follow Up: [place] [address] [id number] [place] [id number]  in 3-5 days, If symptoms do not improve or  worsen     This chart was dictated using voice recognition software.  Despite best efforts to proofread,  errors can occur which can change the documentation meaning.   Fatima Blank, MD 08/28/17 1354

## 2017-08-28 NOTE — ED Triage Notes (Signed)
Patient complains of abscess to labia x 3 days. Reports nausea, chills, fever, vomiting with same. Alert and oriented

## 2017-08-28 NOTE — ED Notes (Signed)
Pt stable, ambulatory, states understanding of discharge instructions 

## 2018-09-15 ENCOUNTER — Encounter (HOSPITAL_COMMUNITY): Payer: Self-pay

## 2018-09-15 ENCOUNTER — Emergency Department (HOSPITAL_COMMUNITY)
Admission: EM | Admit: 2018-09-15 | Discharge: 2018-09-16 | Disposition: A | Discharge status: Home / Self Care | Payer: Medicaid Other | Attending: Emergency Medicine | Admitting: Emergency Medicine

## 2018-09-15 DIAGNOSIS — R739 Hyperglycemia, unspecified: Secondary | ICD-10-CM

## 2018-09-15 DIAGNOSIS — Z7984 Long term (current) use of oral hypoglycemic drugs: Secondary | ICD-10-CM | POA: Insufficient documentation

## 2018-09-15 DIAGNOSIS — J45909 Unspecified asthma, uncomplicated: Secondary | ICD-10-CM | POA: Diagnosis not present

## 2018-09-15 DIAGNOSIS — Z79899 Other long term (current) drug therapy: Secondary | ICD-10-CM | POA: Diagnosis not present

## 2018-09-15 DIAGNOSIS — E785 Hyperlipidemia, unspecified: Secondary | ICD-10-CM | POA: Insufficient documentation

## 2018-09-15 DIAGNOSIS — E1165 Type 2 diabetes mellitus with hyperglycemia: Secondary | ICD-10-CM | POA: Insufficient documentation

## 2018-09-15 DIAGNOSIS — L03011 Cellulitis of right finger: Secondary | ICD-10-CM | POA: Insufficient documentation

## 2018-09-15 DIAGNOSIS — M79641 Pain in right hand: Secondary | ICD-10-CM | POA: Diagnosis present

## 2018-09-15 DIAGNOSIS — F17210 Nicotine dependence, cigarettes, uncomplicated: Secondary | ICD-10-CM | POA: Insufficient documentation

## 2018-09-15 NOTE — ED Triage Notes (Signed)
Pt states that she picks at her fingers and has swelling and pain to her R thumb and R middle finger

## 2018-09-16 LAB — CBG MONITORING, ED: GLUCOSE-CAPILLARY: 210 mg/dL — AB (ref 70–99)

## 2018-09-16 MED ORDER — DOXYCYCLINE HYCLATE 100 MG PO TABS
100.0000 mg | ORAL_TABLET | Freq: Once | ORAL | Status: AC
  Administered 2018-09-16: 100 mg via ORAL

## 2018-09-16 MED ORDER — DOXYCYCLINE HYCLATE 100 MG PO CAPS: 100.0000 mg | ORAL_CAPSULE | Freq: Two times a day (BID) | ORAL | 0 refills | Status: DC

## 2018-09-16 MED ORDER — HYDROCODONE-ACETAMINOPHEN 5-325 MG PO TABS: 1.0000 | ORAL_TABLET | Freq: Four times a day (QID) | ORAL | 0 refills | Status: DC | PRN

## 2018-09-16 NOTE — ED Provider Notes (Signed)
[facility] EMERGENCY DEPARTMENT Provider Note   CSN: [id number] Arrival date & time: 09/15/18  2218     History   Chief Complaint Chief Complaint  Patient presents with  . Hand Pain    HPI [name] is a 43 y.o. female.  The history is provided by the patient.  Hand Pain  This is a new problem. The current episode started 12 to 24 hours ago. The problem occurs constantly. The problem has been rapidly worsening. Exacerbated by: palpation. Nothing relieves the symptoms. Treatments tried: warm soaks. The treatment provided no relief.  Patient presents for right thumb pain and swelling over the past 24 hours.  She reports that she picks at her fingers frequently and she thinks she has an infection. No Fevers or vomiting. She reports she did have an infection on her right middle finger, but that is improved  Past Medical History:  Diagnosis Date  . Bipolar 1 disorder ([place])   . Bronchitis   . Depressive disorder, not elsewhere classified   . Gestational diabetes   . Hidradenitis suppurativa   . Obesity   . Tobacco abuse     Patient Active Problem List   Diagnosis Date Noted  . Bipolar disorder, curr episode mixed, severe, w/o psychotic features ([place]) 08/16/2015  . Cannabis use disorder, severe, dependence ([place]) 08/13/2015  . Yeast vaginitis 11/07/2014  . Vulvovaginal candidiasis 10/16/2014  . Night sweats 02/22/2014  . 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 Asthma, chronic 03/12/2013  . Hyperlipidemia 12/10/2011  . DM (diabetes mellitus), type 2, uncontrolled ([place]) 12/07/2011  . HIDRADENITIS SUPPURATIVA 06/12/2009  . OBESITY, UNSPECIFIED 10/31/2008  . BACK PAIN, LUMBAR 10/31/2008  . TOBACCO DEPENDENCE 09/29/2006  . PAPANICOLAOU SMEAR, ABNORMAL 09/29/2006    Past Surgical History:  Procedure Laterality Date  . TUBAL LIGATION       OB History   No obstetric history on file.      Home Medications    Prior to Admission medications     Medication Sig Start Date End Date Taking? Authorizing Provider  albuterol (PROVENTIL HFA;VENTOLIN HFA) 108 (90 BASE) MCG/ACT inhaler Inhale 2 puffs into the lungs every 6 (six) hours as needed for wheezing or shortness of breath. 02/22/14   Bernadene Bell, MD  albuterol (PROVENTIL HFA;VENTOLIN HFA) 108 (90 Base) MCG/ACT inhaler Inhale 2 puffs into the lungs every 6 (six) hours as needed for wheezing or shortness of breath. 07/26/17   Varney Biles, MD  doxycycline (VIBRAMYCIN) 100 MG capsule Take 1 capsule (100 mg total) by mouth 2 (two) times daily. One po bid x 7 days 09/16/18   Ripley Fraise, MD  escitalopram (LEXAPRO) 20 MG tablet Take 1 tablet by mouth daily. 09/25/15   [provider]  glipiZIDE (GLUCOTROL) 10 MG tablet Take 1 tablet (10 mg total) by mouth 2 (two) times daily before a meal. 07/26/17   Varney Biles, MD  glucose blood (ONE TOUCH TEST STRIPS) test strip Use as instructed 07/22/15   Haney, Alyssa A, MD  HYDROcodone-acetaminophen (NORCO/VICODIN) 5-325 MG tablet Take 1 tablet by mouth every 6 (six) hours as needed for severe pain. 09/16/18   Ripley Fraise, MD  hydrOXYzine (ATARAX/VISTARIL) 25 MG tablet Take 1 tablet (25 mg total) by mouth every 6 (six) hours as needed for anxiety. 08/16/15   Derrill Center, NP  lamoTRIgine (LAMICTAL) 200 MG tablet Take 1 tablet (200 mg total) by mouth daily. 08/16/15   Derrill Center, NP  Lancets [facility] ULTRASOFT) lancets Use as  instructed. 06/18/14   Bernadene Bell, MD  levonorgestrel-ethinyl estradiol (SEASONALE,INTROVALE,JOLESSA) 0.15-0.03 MG tablet Take 1 tablet by mouth daily. 08/06/15   [provider]  meloxicam (MOBIC) 15 MG tablet Take 15 mg by mouth daily. 09/25/15   [provider]  metFORMIN (GLUCOPHAGE) 500 MG tablet Take 1 tablet (500 mg total) by mouth 2 (two) times daily with a meal. 08/16/15   Derrill Center, NP  metFORMIN (GLUCOPHAGE) 500 MG tablet Take 1 tablet (500 mg total) by mouth 2 (two)  times daily with a meal. 07/26/17   Nanavati, Ankit, MD  nicotine (NICODERM CQ - DOSED IN MG/24 HOURS) 14 mg/24hr patch Place 1 patch (14 mg total) onto the skin daily. Patient not taking: Reported on 10/24/2015 08/16/15   Derrill Center, NP  pravastatin (PRAVACHOL) 20 MG tablet Take 1 tablet (20 mg total) by mouth daily. Patient not taking: Reported on 07/04/2015 02/22/14   Bernadene Bell, MD  risperiDONE (RISPERDAL M-TABS) 3 MG disintegrating tablet Take 1 tablet (3 mg total) by mouth at bedtime. Patient not taking: Reported on 10/25/2015 08/16/15   Derrill Center, NP  zolpidem (AMBIEN) 10 MG tablet Take 5 mg by mouth at bedtime as needed for sleep.  09/25/15   [provider]    Family History Family History  Problem Relation Age of Onset  . Hypertension Mother   . Schizophrenia Mother   . Hypertension Father   . Diabetes Father     Social History Social History   Tobacco Use  . Smoking status: Current Every Day Smoker    Packs/day: 1.00    Types: Cigarettes  . Smokeless tobacco: Never Used  Substance Use Topics  . Alcohol use: Yes    Alcohol/week: 0.0 standard drinks  . Drug use: Yes    Types: Marijuana     Allergies   Patient has no known allergies.   Review of Systems Review of Systems  Constitutional: Negative for fever.  Gastrointestinal: Negative for vomiting.  Skin: Positive for wound.     Physical Exam Updated Vital Signs BP (!) 152/93 (BP Location: Right Arm)   Pulse 72   Temp 98.4 F (36.9 C) (Oral)   Resp 16   SpO2 100%   Physical Exam  CONSTITUTIONAL: Well developed/well nourished, anxious HEAD: Normocephalic/atraumatic EYES: EOMI/PERRL ENMT: Mucous membranes moist NECK: supple no meningeal signs CV: S1/S2 noted, no murmurs/rubs/gallops noted LUNGS: Lungs are clear to auscultation bilaterally, no apparent distress ABDOMEN: soft, nontender  NEURO: Pt is awake/alert/appropriate, moves all extremitiesx4.  No facial droop.     EXTREMITIES: pulses normal/equal, full ROM, no crepitus, tenderness to palpation of right thumb.  No tenderness noted to right middle finger.  She can flex and extend the right thumb but limited due to pain. SKIN: warm, color normal PSYCH: Anxious  Patient gave verbal permission to utilize photo for medical documentation only The image was not stored on any personal device      ED Treatments / Results  Labs (all labs ordered are listed, but only abnormal results are displayed) Labs Reviewed  CBG MONITORING, ED - Abnormal; Notable for the following components:      Result Value   Glucose-Capillary 210 (*)    All other components within normal limits    EKG None  Radiology No results found.  Procedures Procedures (including critical care time)  Medications Ordered in ED Medications  doxycycline (VIBRA-TABS) tablet 100 mg (100 mg Oral Given 09/16/18 0222)     Initial  Impression / Assessment and Plan / ED Course  I have reviewed the triage vital signs and the nursing notes.  Pertinent labs results that were available during my care of the patient were reviewed by me and considered in my medical decision making (see chart for details).     Patient with infection developing on right thumb.  Her right middle finger looks like a previous paronychia is resolved. No convincing signs of a felon or paronychia on the right thumb.  However suspicious for developing cellulitis.  Will start antibiotics.  Advised warm soaks and monitoring.  We discussed return precautions including any worsening pain, swelling, redness throughout the hand, and she has been referred to hand surgery  Final Clinical Impressions(s) / ED Diagnoses   Final diagnoses:  Cellulitis of right thumb  Hyperglycemia    ED Discharge Orders         Ordered    doxycycline (VIBRAMYCIN) 100 MG capsule  2 times daily     09/16/18 0213    HYDROcodone-acetaminophen (NORCO/VICODIN) 5-325 MG tablet  Every 6 hours  PRN     09/16/18 0231           Ripley Fraise, MD 09/16/18 0236

## 2019-02-09 ENCOUNTER — Emergency Department (HOSPITAL_COMMUNITY)
Admission: EM | Admit: 2019-02-09 | Discharge: 2019-02-10 | Disposition: A | Discharge status: Home / Self Care | Payer: Medicaid Other | Attending: Emergency Medicine | Admitting: Emergency Medicine

## 2019-02-09 DIAGNOSIS — E119 Type 2 diabetes mellitus without complications: Secondary | ICD-10-CM | POA: Diagnosis not present

## 2019-02-09 DIAGNOSIS — Z5321 Procedure and treatment not carried out due to patient leaving prior to being seen by health care provider: Secondary | ICD-10-CM | POA: Insufficient documentation

## 2019-02-09 DIAGNOSIS — M79673 Pain in unspecified foot: Secondary | ICD-10-CM | POA: Insufficient documentation

## 2019-02-10 ENCOUNTER — Other Ambulatory Visit: Payer: Self-pay

## 2019-02-10 ENCOUNTER — Ambulatory Visit (HOSPITAL_COMMUNITY)
Admission: EM | Admit: 2019-02-10 | Discharge: 2019-02-10 | Disposition: A | Discharge status: Home / Self Care | Payer: Medicaid Other | Attending: Emergency Medicine | Admitting: Emergency Medicine

## 2019-02-10 ENCOUNTER — Encounter (HOSPITAL_COMMUNITY): Payer: Self-pay

## 2019-02-10 DIAGNOSIS — M79671 Pain in right foot: Secondary | ICD-10-CM | POA: Diagnosis not present

## 2019-02-10 DIAGNOSIS — L03031 Cellulitis of right toe: Secondary | ICD-10-CM

## 2019-02-10 DIAGNOSIS — B353 Tinea pedis: Secondary | ICD-10-CM

## 2019-02-10 MED ORDER — CEPHALEXIN 500 MG PO CAPS: 500.0000 mg | ORAL_CAPSULE | Freq: Four times a day (QID) | ORAL | 0 refills | Status: AC

## 2019-02-10 MED ORDER — FLUCONAZOLE 150 MG PO TABS: 150.0000 mg | ORAL_TABLET | ORAL | 0 refills | Status: AC

## 2019-02-10 MED ORDER — HYDROCODONE-ACETAMINOPHEN 5-325 MG PO TABS: 1.0000 | ORAL_TABLET | Freq: Four times a day (QID) | ORAL | 0 refills | Status: DC | PRN

## 2019-02-10 MED ORDER — CLOTRIMAZOLE 1 % EX CREA: TOPICAL_CREAM | CUTANEOUS | 0 refills | Status: DC

## 2019-02-10 NOTE — ED Triage Notes (Signed)
Pt states she has a crack between her toe and its very painful . Pt states she has been trying to medicate it.

## 2019-02-10 NOTE — Discharge Instructions (Signed)
You likely have an underlying fungal infection between your toes with a bacterial infection causing the increased pain Keep foot open to air, try to keep clean and dry, dry between toes after showering Please begin Keflex 4 times a day for the next week Begin Diflucan once weekly for the next 2 to 4 weeks Apply Lotrimin twice daily to foot/toes Use anti-inflammatories for pain/swelling. You may take up to 800 mg Ibuprofen every 8 hours with food. You may supplement Ibuprofen with Tylenol [phone] mg every 8 hours.  For severe pain may use hydrocodone, use sparingly, do not drive or work after taking, limit use to bedtime.  There is a risk of addiction when taking these medicines.  Please follow-up in 2 to 3 days if not having any improvement with the above

## 2019-02-10 NOTE — ED Provider Notes (Addendum)
[place]    CSN: [id number] Arrival date & time: 02/10/19  1015      History   Chief Complaint Chief Complaint  Patient presents with  . Foot Pain    HPI [name] is a 43 y.o. female history of tobacco use, DM type II, presenting today for evaluation of foot pain.  Patient states that yesterday morning she started to have pain to her third through fifth toes on her right foot.  Denies any injury.  She notes that she believes that there is a crack present.  She notes that she is diabetic-states her last A1c was less than 7.  This is not on file.  Denies numbness or tingling.  Denies fevers.  She has been applying Gold [place] as well as peroxide.  HPI  Past Medical History:  Diagnosis Date  . Bipolar 1 disorder ([place])   . Bronchitis   . Depressive disorder, not elsewhere classified   . Gestational diabetes   . Hidradenitis suppurativa   . Obesity   . Tobacco abuse     Patient Active Problem List   Diagnosis Date Noted  . Bipolar disorder, curr episode mixed, severe, w/o psychotic features ([place]) 08/16/2015  . Cannabis use disorder, severe, dependence ([place]) 08/13/2015  . Yeast vaginitis 11/07/2014  . Vulvovaginal candidiasis 10/16/2014  . Night sweats 02/22/2014  . 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 Asthma, chronic 03/12/2013  . Hyperlipidemia 12/10/2011  . DM (diabetes mellitus), type 2, uncontrolled ([place]) 12/07/2011  . HIDRADENITIS SUPPURATIVA 06/12/2009  . OBESITY, UNSPECIFIED 10/31/2008  . BACK PAIN, LUMBAR 10/31/2008  . TOBACCO DEPENDENCE 09/29/2006  . PAPANICOLAOU SMEAR, ABNORMAL 09/29/2006    Past Surgical History:  Procedure Laterality Date  . TUBAL LIGATION      OB History   No obstetric history on file.      Home Medications    Prior to Admission medications   Medication Sig Start Date End Date Taking? Authorizing Provider  albuterol (PROVENTIL HFA;VENTOLIN HFA) 108 (90 BASE) MCG/ACT inhaler Inhale 2 puffs into the lungs every 6  (six) hours as needed for wheezing or shortness of breath. 02/22/14   Bernadene Bell, MD  albuterol (PROVENTIL HFA;VENTOLIN HFA) 108 (90 Base) MCG/ACT inhaler Inhale 2 puffs into the lungs every 6 (six) hours as needed for wheezing or shortness of breath. 07/26/17   Varney Biles, MD  cephALEXin (KEFLEX) 500 MG capsule Take 1 capsule (500 mg total) by mouth 4 (four) times daily for 7 days. 02/10/19 02/17/19  ,  C, PA-C  clotrimazole (LOTRIMIN) 1 % cream Apply to affected area 2 times daily 02/10/19   , [employer] C, PA-C  doxycycline (VIBRAMYCIN) 100 MG capsule Take 1 capsule (100 mg total) by mouth 2 (two) times daily. One po bid x 7 days 09/16/18   Ripley Fraise, MD  escitalopram (LEXAPRO) 20 MG tablet Take 1 tablet by mouth daily. 09/25/15   [provider]  fluconazole (DIFLUCAN) 150 MG tablet Take 1 tablet (150 mg total) by mouth once a week for 4 doses. 02/10/19 03/04/19  ,  C, PA-C  glipiZIDE (GLUCOTROL) 10 MG tablet Take 1 tablet (10 mg total) by mouth 2 (two) times daily before a meal. 07/26/17   Kathrynn Humble, Ankit, MD  glucose blood (ONE TOUCH TEST STRIPS) test strip Use as instructed 07/22/15   Haney, Alyssa A, MD  HYDROcodone-acetaminophen (NORCO/VICODIN) 5-325 MG tablet Take 1 tablet by mouth every 6 (six) hours as needed for severe pain. 02/10/19   Joneen Caraway, [employer]  C, PA-C  hydrOXYzine (ATARAX/VISTARIL) 25 MG tablet Take 1 tablet (25 mg total) by mouth every 6 (six) hours as needed for anxiety. 08/16/15   Derrill Center, NP  lamoTRIgine (LAMICTAL) 200 MG tablet Take 1 tablet (200 mg total) by mouth daily. 08/16/15   Derrill Center, NP  Lancets [facility] ULTRASOFT) lancets Use as instructed. 06/18/14   Bernadene Bell, MD  levonorgestrel-ethinyl estradiol (SEASONALE,INTROVALE,JOLESSA) 0.15-0.03 MG tablet Take 1 tablet by mouth daily. 08/06/15   [provider]  meloxicam (MOBIC) 15 MG tablet Take 15 mg by mouth daily. 09/25/15   [provider]  metFORMIN (GLUCOPHAGE) 500 MG tablet Take 1 tablet (500 mg total) by mouth 2 (two) times daily with a meal. 08/16/15   Derrill Center, NP  metFORMIN (GLUCOPHAGE) 500 MG tablet Take 1 tablet (500 mg total) by mouth 2 (two) times daily with a meal. 07/26/17   Nanavati, Ankit, MD  nicotine (NICODERM CQ - DOSED IN MG/24 HOURS) 14 mg/24hr patch Place 1 patch (14 mg total) onto the skin daily. Patient not taking: Reported on 10/24/2015 08/16/15   Derrill Center, NP  pravastatin (PRAVACHOL) 20 MG tablet Take 1 tablet (20 mg total) by mouth daily. Patient not taking: Reported on 07/04/2015 02/22/14   Bernadene Bell, MD  risperiDONE (RISPERDAL M-TABS) 3 MG disintegrating tablet Take 1 tablet (3 mg total) by mouth at bedtime. Patient not taking: Reported on 10/25/2015 08/16/15   Derrill Center, NP  zolpidem (AMBIEN) 10 MG tablet Take 5 mg by mouth at bedtime as needed for sleep.  09/25/15   [provider]    Family History Family History  Problem Relation Age of Onset  . Hypertension Mother   . Schizophrenia Mother   . Hypertension Father   . Diabetes Father     Social History Social History   Tobacco Use  . Smoking status: Current Every Day Smoker    Packs/day: 1.00    Types: Cigarettes  . Smokeless tobacco: Never Used  Substance Use Topics  . Alcohol use: Yes    Alcohol/week: 0.0 standard drinks  . Drug use: Yes    Types: Marijuana     Allergies   Patient has no known allergies.   Review of Systems Review of Systems  Constitutional: Negative for fatigue and fever.  HENT: Negative for mouth sores.   Eyes: Negative for visual disturbance.  Respiratory: Negative for shortness of breath.   Cardiovascular: Negative for chest pain.  Gastrointestinal: Negative for abdominal pain, nausea and vomiting.  Genitourinary: Negative for genital sores.  Musculoskeletal: Positive for gait problem and myalgias. Negative for arthralgias and joint swelling.  Skin: Positive for color  change and wound. Negative for rash.  Neurological: Negative for dizziness, weakness, light-headedness and headaches.     Physical Exam Triage Vital Signs ED Triage Vitals  Enc Vitals Group     BP 02/10/19 1048 (!) 167/81     Pulse Rate 02/10/19 1048 70     Resp 02/10/19 1048 18     Temp 02/10/19 1048 98.7 F (37.1 C)     Temp src --      SpO2 02/10/19 1048 100 %     Weight 02/10/19 1049 152 lb (68.9 kg)     Height --      Head Circumference --      Peak Flow --      Pain Score 02/10/19 1049 10     Pain Loc --  Pain Edu? --      Excl. in [place]? --    No data found.  Updated Vital Signs BP (!) 167/81 (BP Location: Right Arm)   Pulse 70   Temp 98.7 F (37.1 C)   Resp 18   Wt 152 lb (68.9 kg)   LMP 02/04/2019   SpO2 100%   BMI 27.80 kg/m   Visual Acuity Right Eye Distance:   Left Eye Distance:   Bilateral Distance:    Right Eye Near:   Left Eye Near:    Bilateral Near:     Physical Exam Vitals signs and nursing note reviewed.  Constitutional:      Appearance: She is well-developed.     Comments: No acute distress  HENT:     Head: Normocephalic and atraumatic.     Nose: Nose normal.  Eyes:     Conjunctiva/sclera: Conjunctivae normal.  Neck:     Musculoskeletal: Neck supple.  Cardiovascular:     Rate and Rhythm: Normal rate.  Pulmonary:     Effort: Pulmonary effort is normal. No respiratory distress.  Abdominal:     General: There is no distension.  Musculoskeletal: Normal range of motion.     Comments: Ambulating with antalgia, dorsalis pedis 2+ Sensation intact distally  Skin:    General: Skin is warm and dry.     Comments: Skin between third and fourth/fourth and fifth toe of right foot appears white and macerated, faint erythema noted to distal metatarsals above these toes, no drainage, mild tender to touch   Neurological:     Mental Status: She is alert and oriented to person, place, and time.      UC Treatments / Results  Labs (all  labs ordered are listed, but only abnormal results are displayed) Labs Reviewed - No data to display  EKG   Radiology No results found.  Procedures Procedures (including critical care time)  Medications Ordered in UC Medications - No data to display  Initial Impression / Assessment and Plan / UC Course  I have reviewed the triage vital signs and the nursing notes.  Pertinent labs & imaging results that were available during my care of the patient were reviewed by me and considered in my medical decision making (see chart for details).     Patient likely with tinea pedis and secondary bacterial infection contributing to pain.  Symptoms began 2 days ago.  Will treat with Keflex, Diflucan weekly and topical Lotrimin.  Do not suspect underlying osteomyelitis at this time, vital signs stable, redness and erythema minimal.  Advised to follow-up in 3 days if not having any improvement or worsening.  Keep foot clean and dry, do not wear socks/closed toe shoes.. Provided 8 tablets of hydrocodone to use sparingly for severe pain.  Advised to continue with Tylenol and ibuprofen for mild to moderate pain.Discussed strict return precautions. Patient verbalized understanding and is agreeable with plan.  Final Clinical Impressions(s) / UC Diagnoses   Final diagnoses:  Right foot pain  Tinea pedis of right foot  Cellulitis of toe of right foot     Discharge Instructions     You likely have an underlying fungal infection between your toes with a bacterial infection causing the increased pain Keep foot open to air, try to keep clean and dry, dry between toes after showering Please begin Keflex 4 times a day for the next week Begin Diflucan once weekly for the next 2 to 4 weeks Apply Lotrimin twice daily to foot/toes  Use anti-inflammatories for pain/swelling. You may take up to 800 mg Ibuprofen every 8 hours with food. You may supplement Ibuprofen with Tylenol [phone] mg every 8 hours.  For  severe pain may use hydrocodone, use sparingly, do not drive or work after taking, limit use to bedtime.  There is a risk of addiction when taking these medicines.  Please follow-up in 2 to 3 days if not having any improvement with the above   ED Prescriptions    Medication Sig Dispense Auth. Provider   cephALEXin (KEFLEX) 500 MG capsule Take 1 capsule (500 mg total) by mouth 4 (four) times daily for 7 days. 28 capsule ,  C, PA-C   fluconazole (DIFLUCAN) 150 MG tablet Take 1 tablet (150 mg total) by mouth once a week for 4 doses. 4 tablet ,  C, PA-C   clotrimazole (LOTRIMIN) 1 % cream Apply to affected area 2 times daily 45 g ,  C, PA-C   HYDROcodone-acetaminophen (NORCO/VICODIN) 5-325 MG tablet Take 1 tablet by mouth every 6 (six) hours as needed for severe pain. 8 tablet , [place] C, PA-C     Controlled Substance Prescriptions [place] Controlled Substance Registry consulted?  Yes, registry checked.  Last prescription in 2/20.  Fill benefit greater than risk.   Joneen Caraway, [place] C, PA-C 02/10/19 1126    , [place] C, PA-C 02/10/19 1126

## 2019-02-10 NOTE — ED Triage Notes (Signed)
Pt arrived stating she is a diabetic and her foot started hurting around her small toe yesterday morning. She is worried about getting an infection. No injury noted to foot.

## 2019-02-22 ENCOUNTER — Other Ambulatory Visit: Payer: Self-pay

## 2019-02-22 ENCOUNTER — Emergency Department (HOSPITAL_COMMUNITY)
Admission: EM | Admit: 2019-02-22 | Discharge: 2019-02-23 | Disposition: A | Discharge status: Other Acute Inpatient Hospital | Payer: Medicaid Other | Attending: Emergency Medicine | Admitting: Emergency Medicine

## 2019-02-22 ENCOUNTER — Encounter (HOSPITAL_COMMUNITY): Payer: Self-pay

## 2019-02-22 DIAGNOSIS — Z20828 Contact with and (suspected) exposure to other viral communicable diseases: Secondary | ICD-10-CM | POA: Insufficient documentation

## 2019-02-22 DIAGNOSIS — Z046 Encounter for general psychiatric examination, requested by authority: Secondary | ICD-10-CM | POA: Insufficient documentation

## 2019-02-22 DIAGNOSIS — E119 Type 2 diabetes mellitus without complications: Secondary | ICD-10-CM | POA: Insufficient documentation

## 2019-02-22 DIAGNOSIS — Z7984 Long term (current) use of oral hypoglycemic drugs: Secondary | ICD-10-CM | POA: Insufficient documentation

## 2019-02-22 DIAGNOSIS — R462 Strange and inexplicable behavior: Secondary | ICD-10-CM

## 2019-02-22 DIAGNOSIS — F312 Bipolar disorder, current episode manic severe with psychotic features: Secondary | ICD-10-CM | POA: Diagnosis not present

## 2019-02-22 DIAGNOSIS — Z79899 Other long term (current) drug therapy: Secondary | ICD-10-CM | POA: Diagnosis not present

## 2019-02-22 DIAGNOSIS — F17210 Nicotine dependence, cigarettes, uncomplicated: Secondary | ICD-10-CM | POA: Diagnosis not present

## 2019-02-22 LAB — CBC WITH DIFFERENTIAL/PLATELET
Abs Immature Granulocytes: 0.04 10*3/uL (ref 0.00–0.07)
Basophils Absolute: 0.1 10*3/uL (ref 0.0–0.1)
Basophils Relative: 1 %
Eosinophils Absolute: 0.1 10*3/uL (ref 0.0–0.5)
Eosinophils Relative: 1 %
HCT: 38.4 % (ref 36.0–46.0)
Hemoglobin: 13 g/dL (ref 12.0–15.0)
Immature Granulocytes: 0 %
Lymphocytes Relative: 28 %
Lymphs Abs: 3.4 10*3/uL (ref 0.7–4.0)
MCH: 24.7 pg — ABNORMAL LOW (ref 26.0–34.0)
MCHC: 33.9 g/dL (ref 30.0–36.0)
MCV: 72.9 fL — ABNORMAL LOW (ref 80.0–100.0)
Monocytes Absolute: 1.1 10*3/uL — ABNORMAL HIGH (ref 0.1–1.0)
Monocytes Relative: 9 %
Neutro Abs: 7.2 10*3/uL (ref 1.7–7.7)
Neutrophils Relative %: 61 %
Platelets: 451 10*3/uL — ABNORMAL HIGH (ref 150–400)
RBC: 5.27 MIL/uL — ABNORMAL HIGH (ref 3.87–5.11)
RDW: 18.6 % — ABNORMAL HIGH (ref 11.5–15.5)
WBC: 11.8 10*3/uL — ABNORMAL HIGH (ref 4.0–10.5)
nRBC: 0 % (ref 0.0–0.2)

## 2019-02-22 LAB — COMPREHENSIVE METABOLIC PANEL
ALT: 17 U/L (ref 0–44)
AST: 24 U/L (ref 15–41)
Albumin: 4.2 g/dL (ref 3.5–5.0)
Alkaline Phosphatase: 54 U/L (ref 38–126)
Anion gap: 11 (ref 5–15)
BUN: 12 mg/dL (ref 6–20)
CO2: 22 mmol/L (ref 22–32)
Calcium: 9.1 mg/dL (ref 8.9–10.3)
Chloride: 103 mmol/L (ref 98–111)
Creatinine, Ser: 0.87 mg/dL (ref 0.44–1.00)
GFR calc Af Amer: >60 mL/min (ref >60)
GFR calc non Af Amer: >60 mL/min (ref >60)
Glucose, Bld: 127 mg/dL — ABNORMAL HIGH (ref 70–99)
Potassium: 3.4 mmol/L — ABNORMAL LOW (ref 3.5–5.1)
Sodium: 136 mmol/L (ref 135–145)
Total Bilirubin: 0.7 mg/dL (ref 0.3–1.2)
Total Protein: 8.1 g/dL (ref 6.5–8.1)

## 2019-02-22 LAB — RAPID URINE DRUG SCREEN, HOSP PERFORMED
Amphetamines: NOT DETECTED
Barbiturates: NOT DETECTED
Benzodiazepines: NOT DETECTED
Cocaine: NOT DETECTED
Opiates: NOT DETECTED
Tetrahydrocannabinol: POSITIVE — AB

## 2019-02-22 LAB — ETHANOL: Alcohol, Ethyl (B): <10 mg/dL (ref <10)

## 2019-02-22 LAB — POC URINE PREG, ED: Preg Test, Ur: NEGATIVE

## 2019-02-22 LAB — CBG MONITORING, ED: Glucose-Capillary: 106 mg/dL — ABNORMAL HIGH (ref 70–99)

## 2019-02-22 MED ORDER — LORAZEPAM 2 MG/ML IJ SOLN: 2.0000 mg | Freq: Once | INTRAMUSCULAR | Status: DC

## 2019-02-22 MED ORDER — ZIPRASIDONE MESYLATE 20 MG IM SOLR
20.0000 mg | Freq: Once | INTRAMUSCULAR | Status: AC
  Administered 2019-02-22: 20 mg via INTRAMUSCULAR
  Filled 2019-02-22: qty 20

## 2019-02-22 MED ORDER — ESCITALOPRAM OXALATE 10 MG PO TABS
20.0000 mg | ORAL_TABLET | Freq: Every day | ORAL | Status: DC
  Administered 2019-02-22 – 2019-02-23 (×2): 20 mg via ORAL
  Filled 2019-02-22 (×2): qty 2

## 2019-02-22 MED ORDER — STERILE WATER FOR INJECTION IJ SOLN
INTRAMUSCULAR | Status: AC
  Administered 2019-02-22: 19:00:00
  Filled 2019-02-22: qty 10

## 2019-02-22 MED ORDER — INSULIN ASPART 100 UNIT/ML ~~LOC~~ SOLN
0.0000 [IU] | Freq: Every day | SUBCUTANEOUS | Status: DC
  Filled 2019-02-22: qty 0.05

## 2019-02-22 MED ORDER — ZIPRASIDONE MESYLATE 20 MG IM SOLR
INTRAMUSCULAR | Status: AC
  Filled 2019-02-22: qty 20

## 2019-02-22 MED ORDER — OLANZAPINE 5 MG PO TBDP: 5.0000 mg | ORAL_TABLET | Freq: Every day | ORAL | Status: DC

## 2019-02-22 MED ORDER — STERILE WATER FOR INJECTION IJ SOLN
INTRAMUSCULAR | Status: AC
  Filled 2019-02-22: qty 10

## 2019-02-22 MED ORDER — NICOTINE 21 MG/24HR TD PT24
21.0000 mg | MEDICATED_PATCH | Freq: Every day | TRANSDERMAL | Status: DC
  Administered 2019-02-23: 21 mg via TRANSDERMAL
  Filled 2019-02-22 (×2): qty 1

## 2019-02-22 MED ORDER — ONDANSETRON HCL 4 MG PO TABS: 4.0000 mg | ORAL_TABLET | Freq: Three times a day (TID) | ORAL | Status: DC | PRN

## 2019-02-22 MED ORDER — INSULIN ASPART 100 UNIT/ML ~~LOC~~ SOLN
4.0000 [IU] | Freq: Three times a day (TID) | SUBCUTANEOUS | Status: DC
  Filled 2019-02-22: qty 0.04

## 2019-02-22 MED ORDER — ACETAMINOPHEN 325 MG PO TABS
650.0000 mg | ORAL_TABLET | ORAL | Status: DC | PRN
  Administered 2019-02-23: 650 mg via ORAL
  Filled 2019-02-22: qty 2

## 2019-02-22 MED ORDER — LAMOTRIGINE 100 MG PO TABS
200.0000 mg | ORAL_TABLET | Freq: Every day | ORAL | Status: DC
  Administered 2019-02-22 – 2019-02-23 (×2): 200 mg via ORAL
  Filled 2019-02-22 (×2): qty 2

## 2019-02-22 MED ORDER — INSULIN ASPART 100 UNIT/ML ~~LOC~~ SOLN
0.0000 [IU] | Freq: Three times a day (TID) | SUBCUTANEOUS | Status: DC
  Filled 2019-02-22: qty 0.15

## 2019-02-22 MED ORDER — LORAZEPAM 2 MG/ML IJ SOLN
INTRAMUSCULAR | Status: AC
  Administered 2019-02-22: 16:00:00 via INTRAMUSCULAR
  Filled 2019-02-22: qty 1

## 2019-02-22 NOTE — ED Notes (Signed)
Pt woke up briefly to remove 4-point restraints. Pt was offered water and a snack. Pt is now sleeping.

## 2019-02-22 NOTE — ED Notes (Signed)
No respiratory or acute distress resting in bed sitter watching pt.

## 2019-02-22 NOTE — ED Provider Notes (Signed)
[facility] DEPT Provider Note   CSN: [id number] Arrival date & time: 02/22/19  1037     History   Chief Complaint Chief Complaint  Patient presents with  . Psychiatric Evaluation    HPI [name] is a 43 y.o. female.     The history is provided by the patient and medical records. No language interpreter was used.     43 year old female with history of bipolar, depression, obesity, tobacco use, diabetes brought here via [employer] for evaluation of erratic behaviors.  Patient states she was trying to discipline her son by taking away his phone.  In the process, he called his football coach and the next thing she knows she is beinn brought here.  It is difficult to obtain history from patient as she is not communicative.  She reported being upset her son but did not go into detail.  She does not report any active SI or HI auditory or visual hallucination.  She denied feeling depressed.  She admits to using marijuana on occasion but none recently.  She report some alcohol use yesterday but none today.  She admits that she does not take her medication as regularly as she should but she states she is able to manage.  She endorsed generalized aches and pains to her left shoulder and right leg waxing and waning and does not new.  She mentioned she would prefer to take a nap.  She denies any nausea vomiting diarrhea.  She request for water and ice.  Past Medical History:  Diagnosis Date  . Bipolar 1 disorder ([place])   . Bronchitis   . Depressive disorder, not elsewhere classified   . Gestational diabetes   . Hidradenitis suppurativa   . Obesity   . Tobacco abuse     Patient Active Problem List   Diagnosis Date Noted  . Bipolar disorder, curr episode mixed, severe, w/o psychotic features ([place]) 08/16/2015  . Cannabis use disorder, severe, dependence ([place]) 08/13/2015  . Yeast vaginitis 11/07/2014  . Vulvovaginal candidiasis 10/16/2014  . Night sweats  02/22/2014  . 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 Asthma, chronic 03/12/2013  . Hyperlipidemia 12/10/2011  . DM (diabetes mellitus), type 2, uncontrolled ([place]) 12/07/2011  . HIDRADENITIS SUPPURATIVA 06/12/2009  . OBESITY, UNSPECIFIED 10/31/2008  . BACK PAIN, LUMBAR 10/31/2008  . TOBACCO DEPENDENCE 09/29/2006  . PAPANICOLAOU SMEAR, ABNORMAL 09/29/2006    Past Surgical History:  Procedure Laterality Date  . TUBAL LIGATION       OB History   No obstetric history on file.      Home Medications    Prior to Admission medications   Medication Sig Start Date End Date Taking? Authorizing Provider  albuterol (PROVENTIL HFA;VENTOLIN HFA) 108 (90 BASE) MCG/ACT inhaler Inhale 2 puffs into the lungs every 6 (six) hours as needed for wheezing or shortness of breath. 02/22/14   Bernadene Bell, MD  albuterol (PROVENTIL HFA;VENTOLIN HFA) 108 (90 Base) MCG/ACT inhaler Inhale 2 puffs into the lungs every 6 (six) hours as needed for wheezing or shortness of breath. 07/26/17   Varney Biles, MD  clotrimazole (LOTRIMIN) 1 % cream Apply to affected area 2 times daily 02/10/19   Wieters, Hallie C, PA-C  doxycycline (VIBRAMYCIN) 100 MG capsule Take 1 capsule (100 mg total) by mouth 2 (two) times daily. One po bid x 7 days 09/16/18   Ripley Fraise, MD  escitalopram (LEXAPRO) 20 MG tablet Take 1 tablet by mouth daily. 09/25/15   [provider]  fluconazole (  DIFLUCAN) 150 MG tablet Take 1 tablet (150 mg total) by mouth once a week for 4 doses. 02/10/19 03/04/19  Wieters, Hallie C, PA-C  glipiZIDE (GLUCOTROL) 10 MG tablet Take 1 tablet (10 mg total) by mouth 2 (two) times daily before a meal. 07/26/17   Kathrynn Humble, Ankit, MD  glucose blood (ONE TOUCH TEST STRIPS) test strip Use as instructed 07/22/15   Haney, Alyssa A, MD  HYDROcodone-acetaminophen (NORCO/VICODIN) 5-325 MG tablet Take 1 tablet by mouth every 6 (six) hours as needed for severe pain. 02/10/19   Wieters, Hallie C, PA-C  hydrOXYzine  (ATARAX/VISTARIL) 25 MG tablet Take 1 tablet (25 mg total) by mouth every 6 (six) hours as needed for anxiety. 08/16/15   Derrill Center, NP  lamoTRIgine (LAMICTAL) 200 MG tablet Take 1 tablet (200 mg total) by mouth daily. 08/16/15   Derrill Center, NP  Lancets [facility] ULTRASOFT) lancets Use as instructed. 06/18/14   Bernadene Bell, MD  levonorgestrel-ethinyl estradiol (SEASONALE,INTROVALE,JOLESSA) 0.15-0.03 MG tablet Take 1 tablet by mouth daily. 08/06/15   [provider]  meloxicam (MOBIC) 15 MG tablet Take 15 mg by mouth daily. 09/25/15   [provider]  metFORMIN (GLUCOPHAGE) 500 MG tablet Take 1 tablet (500 mg total) by mouth 2 (two) times daily with a meal. 08/16/15   Derrill Center, NP  metFORMIN (GLUCOPHAGE) 500 MG tablet Take 1 tablet (500 mg total) by mouth 2 (two) times daily with a meal. 07/26/17   Nanavati, Ankit, MD  nicotine (NICODERM CQ - DOSED IN MG/24 HOURS) 14 mg/24hr patch Place 1 patch (14 mg total) onto the skin daily. Patient not taking: Reported on 10/24/2015 08/16/15   Derrill Center, NP  pravastatin (PRAVACHOL) 20 MG tablet Take 1 tablet (20 mg total) by mouth daily. Patient not taking: Reported on 07/04/2015 02/22/14   Bernadene Bell, MD  risperiDONE (RISPERDAL M-TABS) 3 MG disintegrating tablet Take 1 tablet (3 mg total) by mouth at bedtime. Patient not taking: Reported on 10/25/2015 08/16/15   Derrill Center, NP  zolpidem (AMBIEN) 10 MG tablet Take 5 mg by mouth at bedtime as needed for sleep.  09/25/15   [provider]    Family History Family History  Problem Relation Age of Onset  . Hypertension Mother   . Schizophrenia Mother   . Hypertension Father   . Diabetes Father     Social History Social History   Tobacco Use  . Smoking status: Current Every Day Smoker    Packs/day: 1.00    Types: Cigarettes  . Smokeless tobacco: Never Used  Substance Use Topics  . Alcohol use: Yes    Alcohol/week: 0.0 standard drinks  . Drug  use: Yes    Types: Marijuana     Allergies   Patient has no known allergies.   Review of Systems Review of Systems  All other systems reviewed and are negative.    Physical Exam Updated Vital Signs BP (!) 168/103   Pulse (!) 112   Temp 98.2 F (36.8 C) (Oral)   Resp 14   Ht 5\' 2"  (1.575 m)   Wt 65.8 kg   LMP 02/22/2019   SpO2 100%   BMI 26.52 kg/m   Physical Exam Vitals signs and nursing note reviewed.  Constitutional:      General: She is not in acute distress.    Appearance: She is well-developed.  HENT:     Head: Atraumatic.  Eyes:     Conjunctiva/sclera: Conjunctivae normal.  Neck:     Musculoskeletal: Neck supple.  Cardiovascular:     Rate and Rhythm: Normal rate and regular rhythm.     Pulses: Normal pulses.     Heart sounds: Normal heart sounds.  Pulmonary:     Breath sounds: Normal breath sounds.  Abdominal:     Palpations: Abdomen is soft.     Tenderness: There is no abdominal tenderness.  Skin:    Findings: No rash.  Neurological:     Mental Status: She is alert and oriented to person, place, and time.     GCS: GCS eye subscore is 4. GCS verbal subscore is 5. GCS motor subscore is 6.  Psychiatric:        Attention and Perception: Attention normal.        Mood and Affect: Affect is labile.        Speech: Speech normal.        Behavior: Behavior is withdrawn.        Thought Content: Thought content does not include homicidal or suicidal ideation.     Comments: Patient does not make eye contact, appears to be upset.      ED Treatments / Results  Labs (all labs ordered are listed, but only abnormal results are displayed) Labs Reviewed  COMPREHENSIVE METABOLIC PANEL - Abnormal; Notable for the following components:      Result Value   Potassium 3.4 (*)    Glucose, Bld 127 (*)    All other components within normal limits  CBC WITH DIFFERENTIAL/PLATELET - Abnormal; Notable for the following components:   WBC 11.8 (*)    RBC 5.27 (*)     MCV 72.9 (*)    MCH 24.7 (*)    RDW 18.6 (*)    Platelets 451 (*)    Monocytes Absolute 1.1 (*)    All other components within normal limits  RAPID URINE DRUG SCREEN, HOSP PERFORMED - Abnormal; Notable for the following components:   Tetrahydrocannabinol POSITIVE (*)    All other components within normal limits  ETHANOL  HEMOGLOBIN A1C  POC URINE PREG, ED  I-STAT BETA HCG BLOOD, ED ([facility], [facility], [facility] ONLY)    EKG None  Radiology No results found.  Procedures Procedures (including critical care time)  Medications Ordered in ED Medications  acetaminophen (TYLENOL) tablet 650 mg (has no administration in time range)  ondansetron (ZOFRAN) tablet 4 mg (has no administration in time range)  nicotine (NICODERM CQ - dosed in mg/24 hours) patch 21 mg (has no administration in time range)  escitalopram (LEXAPRO) tablet 20 mg (has no administration in time range)  lamoTRIgine (LAMICTAL) tablet 200 mg (has no administration in time range)  insulin aspart (novoLOG) injection 0-15 Units (0 Units Subcutaneous Not Given 02/22/19 1313)  insulin aspart (novoLOG) injection 0-5 Units (has no administration in time range)  insulin aspart (novoLOG) injection 4 Units (4 Units Subcutaneous Not Given 02/22/19 1313)  OLANZapine zydis (ZYPREXA) disintegrating tablet 5 mg (has no administration in time range)     Initial Impression / Assessment and Plan / ED Course  I have reviewed the triage vital signs and the nursing notes.  Pertinent labs & imaging results that were available during my care of the patient were reviewed by me and considered in my medical decision making (see chart for details).        BP (!) 168/103   Pulse (!) 112   Temp 98.2 F (36.8 C) (Oral)   Resp 14   Ht 5'  2" (1.575 m)   Wt 65.8 kg   LMP 02/22/2019   SpO2 100%   BMI 26.52 kg/m    Final Clinical Impressions(s) / ED Diagnoses   Final diagnoses:  Bizarre behavior    ED Discharge Orders    None     11:09 AM  Patient brought here via [employer] for aggressive behaviors.  According to [employer], patient was found chasing her son around the parking lot at a motel yelling.  She exhibited similar behavior the day before at a different hotel and was "kicked out".  Apparently her son contact his football coach who came by and pick her son up, and patient was chasing after the football coach's car when he contacted [employer].  Due to her underlying psychiatric history, will obtain screening labs.  Patient however denies SI or HI.  2:39 PM TTS have evaluated pt and felt pt would need IVC as she is a danger to herself.  Dr. Eulis Foster has evaluated pt as well and agrees with IVC as pt is now responding to internal stimuli.    5:18 PM Patient is medically cleared.  IVC paperwork filled.  Patient will need further management by psychiatry.   Domenic Moras, PA-C 02/22/19 1718    Daleen Bo, MD 02/23/19 1014

## 2019-02-22 NOTE — ED Notes (Signed)
Pt became agitated, upset, uncooperative, could not be redirected, threatening.  Pt became combative, trying to security several times, kneed a Psychologist, educational. PRN Geodon 20 mg IM given   Pt put in restraints, see flowsheet.

## 2019-02-22 NOTE — ED Provider Notes (Signed)
  Face-to-face evaluation   History: She presents with law enforcement after she was found chasing her son.  She has been reluctant to talk to various healthcare providers.  I saw her at 1415 p.m.  At this time she was lying supine, and comfortable.  She did speak to me and states that she is "cycling."  To specify that she indicated that she is having racing thoughts, feels anxious, and is unable to control the symptoms.  She states that the symptoms have been going on for a long time but will not specify how long.  She does indicate that they have been present previously, when she was hospitalized in a psychiatric facility.  She would not answer questions about visual or auditory hallucinations.  She paused before answering many questions.  Initially she refused to get blood and urine but at this time states that she will do it.  She asked me if she could leave and I stated that she could not.  Physical exam: Alert, anxious, uncomfortable.  There is no dysarthria or aphasia.  She appears to be responding to internal stimuli.  She has alternately, and anxious.  Occasionally she concentrates together, in obvious distress.    2:10 PM-call received from TTS was evaluated patient.  They feel that she has thought blocking, possibly responding to internal stimuli, and a risk to herself or others if she were to leave.  She has been evaluated by them and felt to require hospitalization in a psychiatric facility.  They request IVC.  1422 p.m.-informed nursing that I would be filling out involuntary commitment on this patient, and to not let her leave.  Security nearby as well, and been informed.  Medical screening examination/treatment/procedure(s) were conducted as a shared visit with non-physician practitioner(s) and myself.  I personally evaluated the patient during the encounter    Daleen Bo, MD 02/23/19 1644

## 2019-02-22 NOTE — ED Triage Notes (Signed)
Pt BIBA from street. Pt was picked up by [employer].  Pt was chasing son around parking lot of hotel. Pt has hx of psych disorders. Pt started following son who was driving with coach.  Pt would not talk with EMS. Pt was crying in ambulance.

## 2019-02-22 NOTE — ED Notes (Signed)
Pt calm and resting, restraints removed gave pt a blanket and drink now resting in bed with eyes closed.

## 2019-02-22 NOTE — ED Notes (Signed)
Pt in room 31. Pt calm, guarded. Pt not answering assessment questions.  Pt cooperative with lab to attain blood.

## 2019-02-22 NOTE — BH Assessment (Signed)
Tele Assessment Note   Patient Name: [name] MRN: [id number] Referring Physician: Eulis Foster Location of Patient: [facility] Location of Provider: [place]  [name] is an 43 y.o. female presenting voluntarily to [facility] ED via [employer]. Patient renders limited history as she appears to be responding to internal stimuli and may be reluctant to participate in assessment process. Chart review was utilized to assist with completing assessment. Patient states that today she attempted to discipline her son by taking away his cell phone and then the police brought her here. When asked what happened she does not respond. Patient provides a delayed response to certain questions only but with mainly one word answers. Patient denies SI/HI. When asked about AVH she does not respond. Patient reports that she lives with her son and boyfriend but refuses to give consent to obtain collateral information. She reports daily THC use and occasional alcohol use. She denies any psychiatric treatment or mental illness.   Per note from PA: "Patient brought here via [employer] for aggressive behaviors.  According to [employer], patient was found chasing her son around the parking lot at a motel yelling.  She exhibited similar behavior the day before at a different hotel and was "kicked out".  Apparently her son contact his football coach who came by and pick her son up, and patient was chasing after the football coach's car when he contacted [employer].  Due to her underlying psychiatric history, will obtain screening labs.  Patient however denies SI or HI."  Per chart review patient has a history of bipolar disorder and schizophrenia. She was last hospitalized at [facility] in 2017 due to psychosis. At that time patient was followed by Dr. Tinnie Gens for med management. Patient has a history of 4 prior psychiatric hospitalizations at [facility], [place], and [facility].   Patient is alert and oriented x 4. She is dressed in scrubs, sitting up in  hospital bed. Her speech is delayed, eye contact is poor, and appears to be thought blocking. Her mood is apathetic and affect is flat. Her insight, judgement, and impulse control are poor. Patient appears to be responding to internal stimuli and paranoid.  Disposition: Jinny Blossom, NP recommends in patient treatment. Patient's RN and EDP notified. This clinician discussed IVC with EDP Eulis Foster, MD.   Diagnosis: F31.2 Bipolar I disorder, current episode, manic, with psychotic features   F29 Unspecified psychotic disorder  Past Medical History:  Past Medical History:  Diagnosis Date  . Bipolar 1 disorder ([place])   . Bronchitis   . Depressive disorder, not elsewhere classified   . Gestational diabetes   . Hidradenitis suppurativa   . Obesity   . Tobacco abuse     Past Surgical History:  Procedure Laterality Date  . TUBAL LIGATION      Family History:  Family History  Problem Relation Age of Onset  . Hypertension Mother   . Schizophrenia Mother   . Hypertension Father   . Diabetes Father     Social History:  reports that she has been smoking cigarettes. She has been smoking about 1.00 pack per day. She has never used smokeless tobacco. She reports current alcohol use. She reports current drug use. Drug: Marijuana.  Additional Social History:  Alcohol / Drug Use Pain Medications: see MAR Prescriptions: see MAR Over the Counter: see MAR History of alcohol / drug use?: Yes Substance #1 Name of Substance 1: THC 1 - Age of First Use: UTA 1 - Amount (size/oz): 1 blunt  1 - Frequency: daily 1 - Duration: UTA 1 - Last Use / Amount: UTA  CIWA: CIWA-Ar BP: (!) 168/103 Pulse Rate: (!) 112 COWS:    Allergies: No Known Allergies  Home Medications: (Not in a hospital admission)   OB/GYN Status:  Patient's last menstrual period was 02/22/2019.  General Assessment Data Location of Assessment: [facility] TTS Assessment: In system Is this a Tele or Face-to-Face Assessment?: Tele  Assessment Is this an Initial Assessment or a Re-assessment for this encounter?: Initial Assessment Patient Accompanied by:: Other([employer]) Language Other than English: No Living Arrangements: Other (Comment)(motel) What gender do you identify as?: Female Marital status: Single Maiden name: Krizek Pregnancy Status: No Living Arrangements: Spouse/significant other, Children Can pt return to current living arrangement?: Yes Admission Status: Voluntary Is patient capable of signing voluntary admission?: No Referral Source: Self/Family/Friend Insurance type: Medicaid     Crisis Care Plan Living Arrangements: Spouse/significant other, Children Legal Guardian: (self) Name of Psychiatrist: none Name of Therapist: none  Education Status Is patient currently in school?: No Is the patient employed, unemployed or receiving disability?: Unemployed  Risk to self with the past 6 months Suicidal Ideation: No Has patient been a risk to self within the past 6 months prior to admission? : No Suicidal Intent: No Has patient had any suicidal intent within the past 6 months prior to admission? : No Is patient at risk for suicide?: No Suicidal Plan?: No Has patient had any suicidal plan within the past 6 months prior to admission? : No Access to Means: No What has been your use of drugs/alcohol within the last 12 months?: daily THC and alcohol use Previous Attempts/Gestures: No How many times?: 0 Other Self Harm Risks: none  Triggers for Past Attempts: None known Intentional Self Injurious Behavior: None Family Suicide History: Unable to assess Recent stressful life event(s): Conflict (Comment)(with son) Persecutory voices/beliefs?: (UTA) Depression: Yes Depression Symptoms: (UTA specific symptoms) Substance abuse history and/or treatment for substance abuse?: No Suicide prevention information given to non-admitted patients: Not applicable  Risk to Others within the past 6 months Homicidal  Ideation: No Does patient have any lifetime risk of violence toward others beyond the six months prior to admission? : No Thoughts of Harm to Others: No Current Homicidal Intent: No Current Homicidal Plan: No Access to Homicidal Means: No Identified Victim: none History of harm to others?: No Assessment of Violence: None Noted Violent Behavior Description: none noted Does patient have access to weapons?: No Criminal Charges Pending?: No Does patient have a court date: No Is patient on probation?: No  Psychosis Hallucinations: (UTA) Delusions: (UTA)  Mental Status Report Appearance/Hygiene: In scrubs Eye Contact: Poor Motor Activity: Freedom of movement Speech: Soft, Elective mutism Level of Consciousness: Quiet/awake Mood: Apathetic Affect: Flat Anxiety Level: None Thought Processes: Thought Blocking Judgement: Impaired Orientation: Person, Time, Place, Situation Obsessive Compulsive Thoughts/Behaviors: None  Cognitive Functioning Concentration: Unable to Assess Memory: Unable to Assess Is patient IDD: No Insight: Unable to Assess Impulse Control: Poor Appetite: (UTA) Have you had any weight changes? : (UTA) Sleep: Unable to Assess Total Hours of Sleep: (UTA) Vegetative Symptoms: Unable to Assess  ADLScreening [facility]) Patient's cognitive ability adequate to safely complete daily activities?: Yes Patient able to express need for assistance with ADLs?: Yes Independently performs ADLs?: Yes (appropriate for developmental age)  Prior Inpatient Therapy Prior Inpatient Therapy: Yes Prior Therapy Dates: 2017 Prior Therapy Facilty/Provider(s): [facility], [place] Reason for Treatment: psychosis  Prior Outpatient Therapy Prior Outpatient Therapy:  Yes Prior Therapy Dates: 2017 Prior Therapy Facilty/Provider(s): Dr. Tinnie Gens Reason for Treatment: bipolar Does patient have an ACCT team?: No Does patient have Intensive In-House Services?  : No Does  patient have [facility] services? : No Does patient have P4CC services?: No  ADL Screening (condition at time of admission) Patient's cognitive ability adequate to safely complete daily activities?: Yes Is the patient deaf or have difficulty hearing?: No Does the patient have difficulty seeing, even when wearing glasses/contacts?: No Does the patient have difficulty concentrating, remembering, or making decisions?: No Patient able to express need for assistance with ADLs?: Yes Does the patient have difficulty dressing or bathing?: No Independently performs ADLs?: Yes (appropriate for developmental age) Does the patient have difficulty walking or climbing stairs?: No Weakness of Legs: None Weakness of Arms/Hands: None  Home Assistive Devices/Equipment Home Assistive Devices/Equipment: None  Therapy Consults (therapy consults require a physician order) PT Evaluation Needed: No OT Evalulation Needed: No SLP Evaluation Needed: No Abuse/Neglect Assessment (Assessment to be complete while patient is alone) Abuse/Neglect Assessment Can Be Completed: Unable to assess, patient is non-responsive or altered mental status Values / Beliefs Cultural Requests During Hospitalization: None Spiritual Requests During Hospitalization: None Consults Spiritual Care Consult Needed: No Social Work Consult Needed: No Regulatory affairs officer (For Healthcare) Does Patient Have a Medical Advance Directive?: No Would patient like information on creating a medical advance directive?: Yes (ED - Information included in AVS)          Disposition: Jinny Blossom, NP recommends in patient treatment. Patient's RN and EDP notified. This clinician discussed IVC with EDP Eulis Foster, MD.  Disposition Initial Assessment Completed for this Encounter: Yes  This service was provided via telemedicine using a 2-way, interactive audio and video technology.  Names of all persons participating in this telemedicine service and their  role in this encounter. Name: [name] Role: patient  Name: Orvis Brill, LCSW Role: TTS  Name:  Role:   Name:  Role:     Orvis Brill 02/22/2019 2:18 PM

## 2019-02-22 NOTE — ED Notes (Signed)
Pt continues to pace hall.

## 2019-02-22 NOTE — ED Notes (Signed)
Pt IVCd.  Pt trying to leave and escape, security had to bring back to room.  Pt agitated, uncooperative.  PRN  Ativan 2 mg IM given.

## 2019-02-22 NOTE — ED Notes (Signed)
No respiratory or acute distress noted alert and confused sitter watching patient.

## 2019-02-22 NOTE — ED Provider Notes (Signed)
I was notified the patient was becoming increasingly agitated, combative, and attempting to leave.  Patient evaluated and placed in the four-point restraints.  She was also given Geodon as she is quite agitated and posing a danger to herself and others.   Veryl Speak, MD 02/22/19 [phone]

## 2019-02-22 NOTE — ED Notes (Signed)
Pt arrived to unit with [employer].  Pt is withdrawn and not speaking to me.  Pt broke down and cried with officer as she is familiar with him.  She is in no distress at this time.

## 2019-02-22 NOTE — BH Assessment (Signed)
[facility] Assessment Progress Note  Per Jinny Blossom, FNP, this pt requires psychiatric hospitalization at this time.  EDP Daleen Bo, MD finds that pt also meets criteria for IVC, which he has initiated with the assistance of ED Secretary Erven Colla.  The following facilities have been contacted to seek placement for this pt, with results as noted:  Beds available, information sent, decision pending:  [place]:  [facility]   Jalene Mullet, [place] Coordinator [phone]

## 2019-02-22 NOTE — ED Notes (Signed)
Pt anxious, agitated, staring at RN. Pt wants to leave, explained to pt that she has not been DCd.

## 2019-02-22 NOTE — ED Notes (Signed)
Attempted x2 to draw labs per MD order. Pt very agitated and refusing, will attempt to draw labs at later time. [employer]

## 2019-02-23 LAB — HEMOGLOBIN A1C
Hgb A1c MFr Bld: 6.8 % — ABNORMAL HIGH (ref 4.8–5.6)
Mean Plasma Glucose: 148 mg/dL

## 2019-02-23 LAB — CBG MONITORING, ED: Glucose-Capillary: 122 mg/dL — ABNORMAL HIGH (ref 70–99)

## 2019-02-23 LAB — SARS CORONAVIRUS 2 BY RT PCR (HOSPITAL ORDER, PERFORMED IN ~~LOC~~ HOSPITAL LAB): SARS Coronavirus 2: NEGATIVE

## 2019-02-23 MED ORDER — ZIPRASIDONE MESYLATE 20 MG IM SOLR
10.0000 mg | Freq: Once | INTRAMUSCULAR | Status: AC
  Administered 2019-02-23: 10 mg via INTRAMUSCULAR
  Filled 2019-02-23: qty 20

## 2019-02-23 NOTE — ED Notes (Addendum)
Pt awake. Alert. Asking and saying she needs to leave, discussed with pt that she was going to need to stay and will get placement.   Pt standing at her door.

## 2019-02-23 NOTE — BH Assessment (Signed)
[facility] Assessment Progress Note  At 09:29 this Probation officer spoke to [facility] at [address].  Pt has been accepted to their facility by Terrilyn Saver, MD to the [facility] unit.  Please call report to [phone]. Jinny Blossom, FNP concurs with this decision.  Pt's nurse, Eustaquio Maize, has been notified.  Pt is under IVC initiated by EDP Daleen Bo, MD on 02/22/2019.  Pt is to be transported via [facility].  Jalene Mullet, [place] Coordinator [phone]

## 2019-02-23 NOTE — ED Notes (Signed)
Pt sleeping, resting comfortably , no s/s of distress.

## 2019-02-23 NOTE — ED Notes (Signed)
Pt refuses to let nurse do a Covid test on her.

## 2019-02-23 NOTE — ED Notes (Signed)
Pt off unit to [facility].  Pt alert, cooperative, no s/s of distress. Pt DC information given to sheriff for [facility]. Belongings given to sheriff for [facility]. Pt ambulatory. Pt transported by sheriff.

## 2019-02-23 NOTE — ED Notes (Signed)
Patient pacing around room, agitated, talking to self and very confrontational. Upon entering room patient threatened staff and became verbally aggressive. MD notified, orders placed.

## 2019-02-23 NOTE — ED Notes (Signed)
Attempted to get patient to verbally agree to taking her medication, but she refused. Security notified and patient still refused. Patient resisted and was non-compliant, patient assisted to the bed so RN could administer IM medication. Patient spoke with the doctor and said, "get your ass in here, come on." Patient now talking about alligator meat and still refusing to get into bed.

## 2019-02-23 NOTE — ED Provider Notes (Signed)
I was called to the room as patient became agitated.  She is walking around cursing and threatening staff.  She has been given Geodon, she has tolerated this in the past.   Ripley Fraise, MD 02/23/19 [phone]

## 2019-02-23 NOTE — ED Notes (Signed)
Refuses to let staff obtain her vital signs.

## 2019-02-23 NOTE — ED Notes (Signed)
Pt walking around in room or standing at door. Pt was at nurses desk, used paper and pen.

## 2019-02-23 NOTE — BHH Counselor (Signed)
At 0258, Per Varney Biles at [facility] has to be out of restraints for 24 hours before considered. Call back once pt is has been out of restraints for 24 hours.   TTS to continue to seek placement.    Vertell Novak, [place], [facility], [facility] Triage Specialist [phone]

## 2019-08-12 IMAGING — DX DG CHEST 2V
2 series · 2 of 2 positions shown · non-contrast
Comparison: Chest radiograph dated 05/31/2011

CLINICAL DATA: 41-year-old female with shortness of breath.

EXAM:
CHEST  2 VIEW

[chest pa]
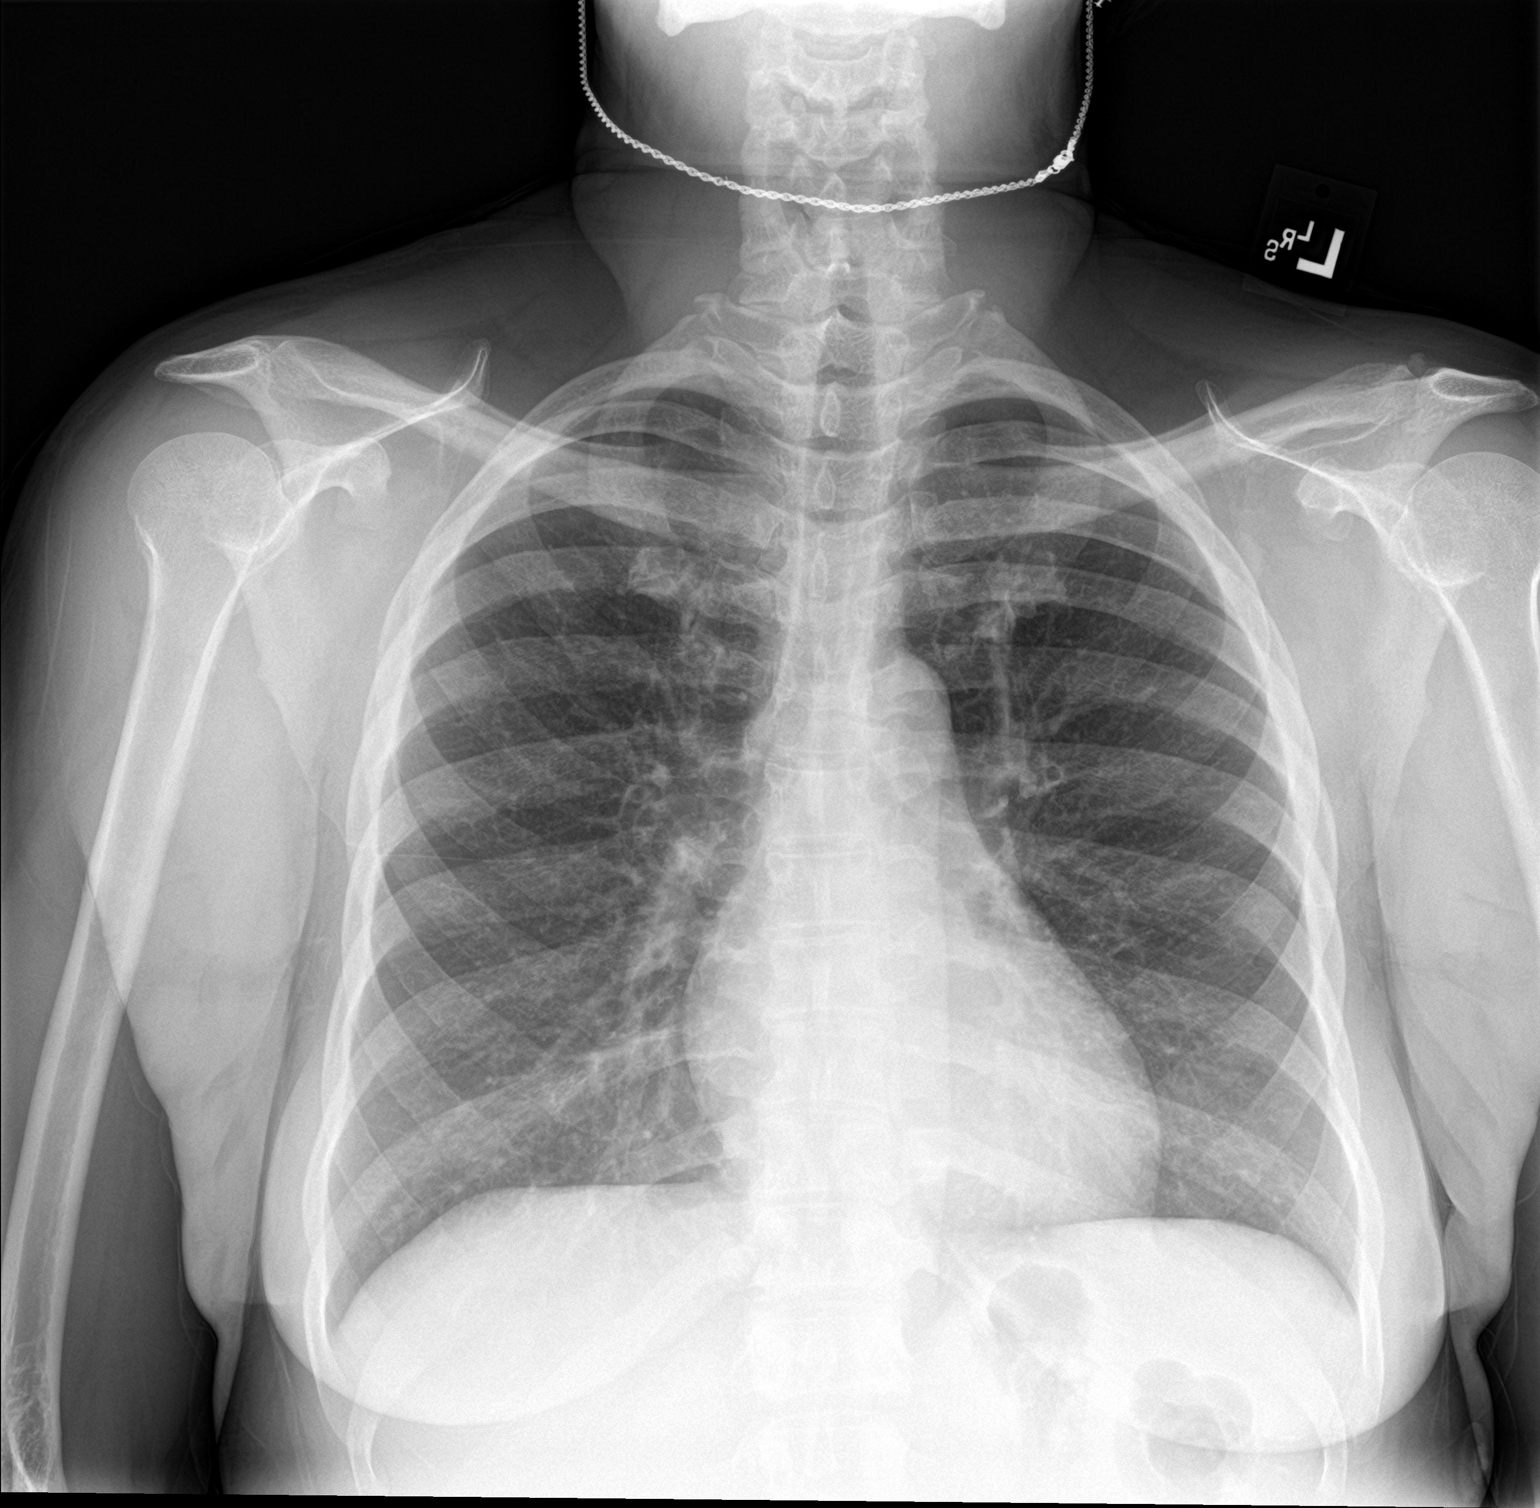

[chest lat]
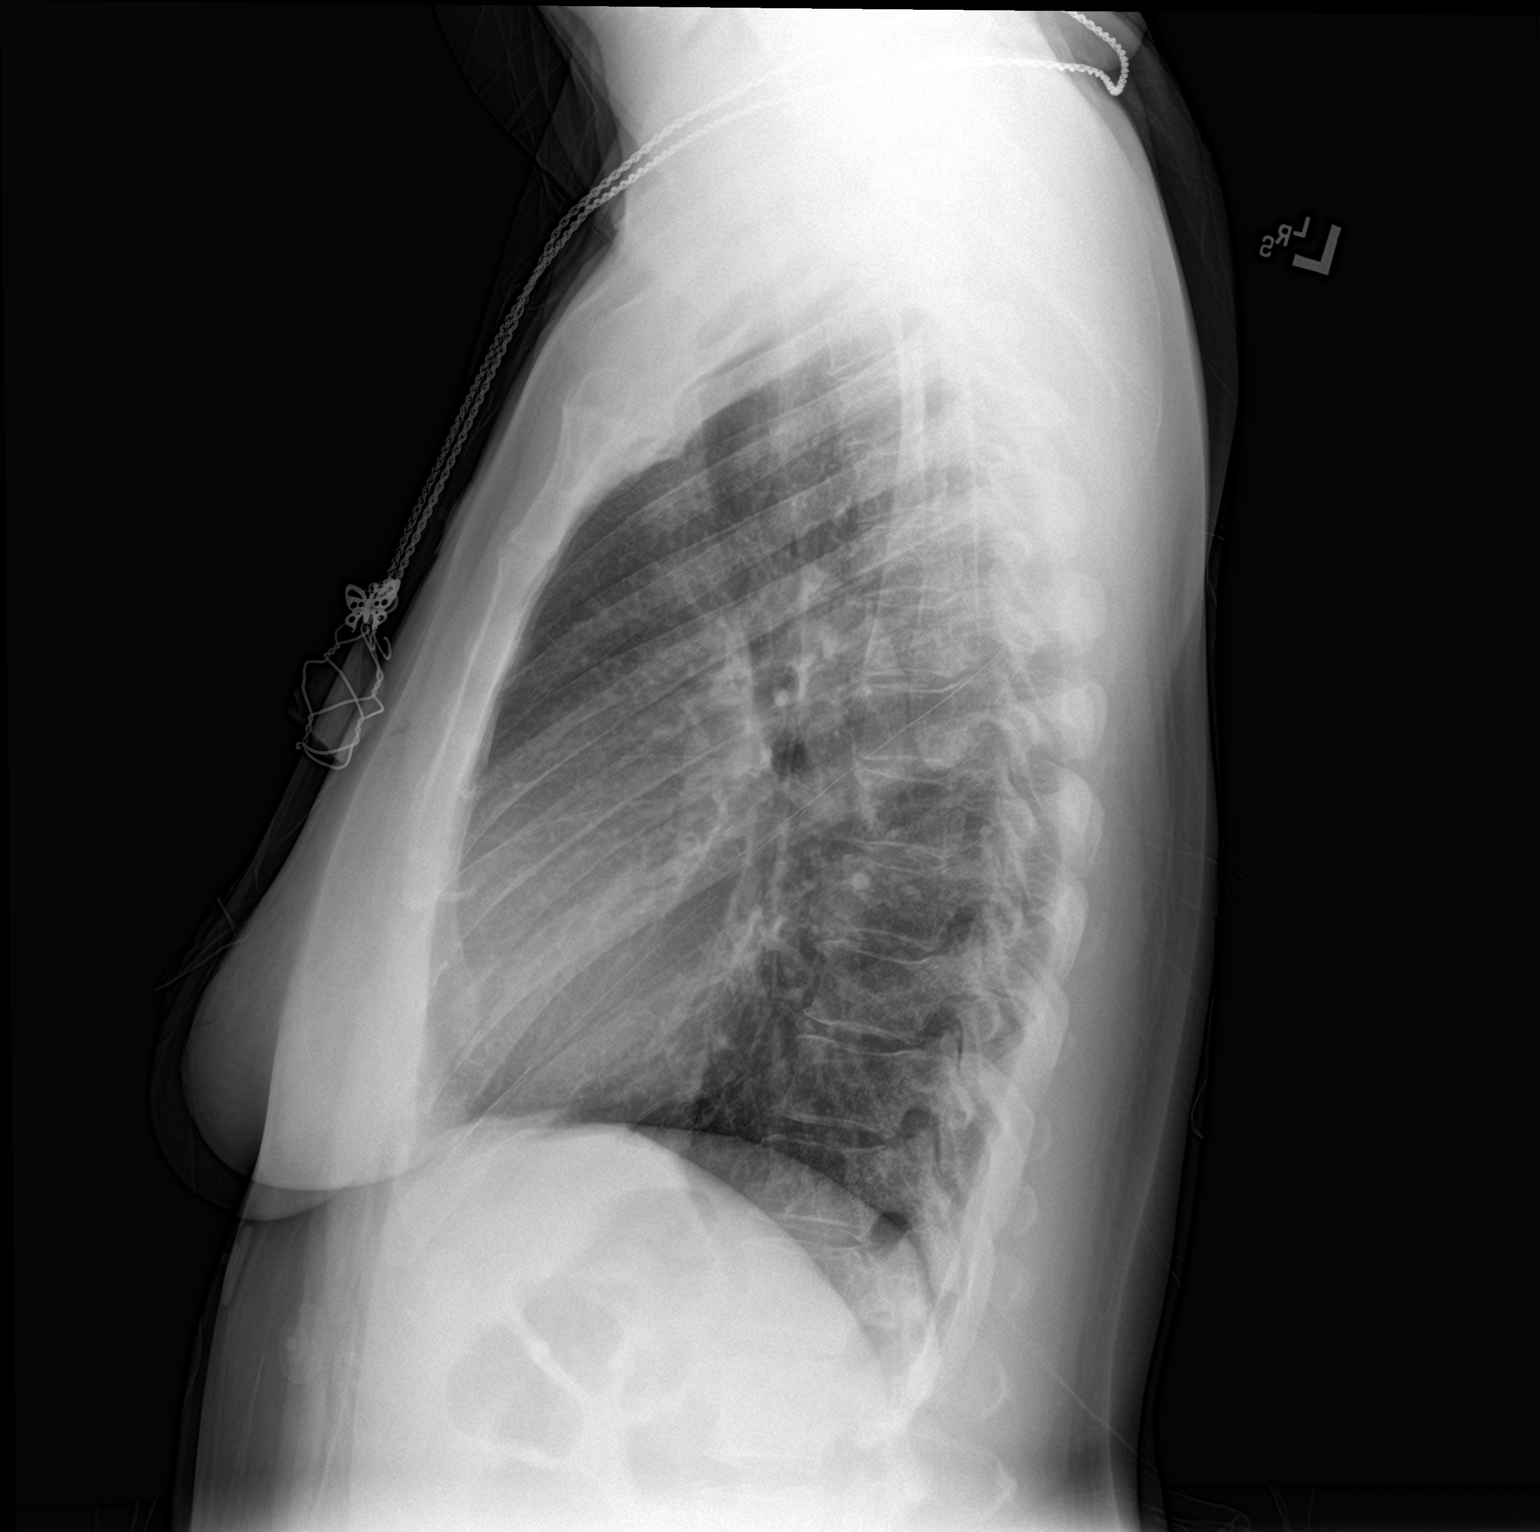

[2 of 2 positions shown; findings below may reference images not displayed]

FINDINGS: The heart size and mediastinal contours are within normal limits.
Both lungs are clear. The visualized skeletal structures are
unremarkable.
IMPRESSION: No active cardiopulmonary disease.

## 2019-12-23 ENCOUNTER — Other Ambulatory Visit: Payer: Self-pay

## 2019-12-23 ENCOUNTER — Ambulatory Visit (HOSPITAL_COMMUNITY)
Admission: EM | Admit: 2019-12-23 | Discharge: 2019-12-23 | Disposition: A | Discharge status: Home / Self Care | Payer: Medicaid Other | Attending: Family Medicine | Admitting: Family Medicine

## 2019-12-23 ENCOUNTER — Encounter (HOSPITAL_COMMUNITY): Payer: Self-pay

## 2019-12-23 DIAGNOSIS — L738 Other specified follicular disorders: Secondary | ICD-10-CM

## 2019-12-23 MED ORDER — DOXYCYCLINE HYCLATE 100 MG PO CAPS: 100.0000 mg | ORAL_CAPSULE | Freq: Two times a day (BID) | ORAL | 0 refills | Status: DC

## 2019-12-23 NOTE — ED Provider Notes (Signed)
[place]    CSN: [id number] Arrival date & time: 12/23/19  1219      History   Chief Complaint Chief Complaint  Patient presents with  . bumps on different parts of body    HPI [name] is a 44 y.o. female.   HPI  Multiple pustules on buttocks, thighs, right lower leg in various stages of healing She states the are painful and slow to heal The ones that are resolving are hyperpigmented and she can feel a "knot" under the skin   Past Medical History:  Diagnosis Date  . Bipolar 1 disorder ([place])   . Bronchitis   . Depressive disorder, not elsewhere classified   . Gestational diabetes   . Hidradenitis suppurativa   . Obesity   . Tobacco abuse     Patient Active Problem List   Diagnosis Date Noted  . Bipolar disorder, curr episode mixed, severe, w/o psychotic features ([place]) 08/16/2015  . Cannabis use disorder, severe, dependence ([place]) 08/13/2015  . Yeast vaginitis 11/07/2014  . Vulvovaginal candidiasis 10/16/2014  . Night sweats 02/22/2014  . 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 Asthma, chronic 03/12/2013  . Hyperlipidemia 12/10/2011  . DM (diabetes mellitus), type 2, uncontrolled ([place]) 12/07/2011  . HIDRADENITIS SUPPURATIVA 06/12/2009  . OBESITY, UNSPECIFIED 10/31/2008  . BACK PAIN, LUMBAR 10/31/2008  . TOBACCO DEPENDENCE 09/29/2006  . PAPANICOLAOU SMEAR, ABNORMAL 09/29/2006    Past Surgical History:  Procedure Laterality Date  . TUBAL LIGATION      OB History   No obstetric history on file.      Home Medications    Prior to Admission medications   Medication Sig Start Date End Date Taking? Authorizing Provider  Lurasidone HCl (LATUDA) 60 MG TABS Take 60 mg by mouth.   Yes [provider]  doxycycline (VIBRAMYCIN) 100 MG capsule Take 1 capsule (100 mg total) by mouth 2 (two) times daily. 12/23/19   Raylene Everts, MD  Lancets [facility] ULTRASOFT) lancets Use as instructed. 06/18/14   Bernadene Bell, MD  LEVEMIR FLEXTOUCH  100 UNIT/ML Pen Inject 14 Units into the skin daily. 11/16/18   [provider]  naproxen sodium (ALEVE) 220 MG tablet Take 440-660 mg by mouth 2 (two) times daily as needed (headache/pain).    [provider]  albuterol (PROVENTIL HFA;VENTOLIN HFA) 108 (90 Base) MCG/ACT inhaler Inhale 2 puffs into the lungs every 6 (six) hours as needed for wheezing or shortness of breath. Patient not taking: Reported on 02/22/2019 07/26/17 12/23/19  Varney Biles, MD  glipiZIDE (GLUCOTROL) 10 MG tablet Take 1 tablet (10 mg total) by mouth 2 (two) times daily before a meal. Patient not taking: Reported on 02/22/2019 07/26/17 12/23/19  Varney Biles, MD  lamoTRIgine (LAMICTAL) 200 MG tablet Take 1 tablet (200 mg total) by mouth daily. Patient not taking: Reported on 02/22/2019 08/16/15 12/23/19  Derrill Center, NP  metFORMIN (GLUCOPHAGE) 500 MG tablet Take 1 tablet (500 mg total) by mouth 2 (two) times daily with a meal. Patient not taking: Reported on 02/22/2019 07/26/17 12/23/19  Varney Biles, MD  risperiDONE (RISPERDAL M-TABS) 3 MG disintegrating tablet Take 1 tablet (3 mg total) by mouth at bedtime. Patient not taking: Reported on 10/25/2015 08/16/15 12/23/19  Derrill Center, NP    Family History Family History  Problem Relation Age of Onset  . Hypertension Mother   . Schizophrenia Mother   . Hypertension Father   . Diabetes Father     Social History Social History  Tobacco Use  . Smoking status: Current Every Day Smoker    Packs/day: 1.00    Types: Cigarettes  . Smokeless tobacco: Never Used  Substance Use Topics  . Alcohol use: Yes    Alcohol/week: 2.0 standard drinks    Types: 2 Shots of liquor per week  . Drug use: Yes    Types: Marijuana     Allergies   Patient has no known allergies.   Review of Systems Review of Systems  Skin: Positive for rash.     Physical Exam Triage Vital Signs ED Triage Vitals  Enc Vitals Group     BP 12/23/19 1308 (!) 145/80      Pulse Rate 12/23/19 1308 70     Resp 12/23/19 1308 18     Temp 12/23/19 1308 98.2 F (36.8 C)     Temp Source 12/23/19 1308 Oral     SpO2 12/23/19 1308 100 %     Weight 12/23/19 1313 178 lb (80.7 kg)     Height 12/23/19 1313 5\' 2"  (1.575 m)     Head Circumference --      Peak Flow --      Pain Score 12/23/19 1312 4     Pain Loc --      Pain Edu? --      Excl. in [place]? --    No data found.  Updated Vital Signs BP (!) 145/80   Pulse 70   Temp 98.2 F (36.8 C) (Oral)   Resp 18   Ht 5\' 2"  (1.575 m)   Wt 80.7 kg   SpO2 100%   BMI 32.56 kg/m      Physical Exam Constitutional:      General: She is not in acute distress.    Appearance: She is well-developed.  HENT:     Head: Normocephalic and atraumatic.  Eyes:     Conjunctiva/sclera: Conjunctivae normal.     Pupils: Pupils are equal, round, and reactive to light.  Cardiovascular:     Rate and Rhythm: Normal rate.  Pulmonary:     Effort: Pulmonary effort is normal. No respiratory distress.  Musculoskeletal:        General: Normal range of motion.     Cervical back: Normal range of motion.  Skin:    General: Skin is warm and dry.     Comments: On right there there is a erythematous macule that is 2 cm across with a central large pustule that is 7 mm across.  This is opened and cultured.  Healing small abscesses on buttocks and legs observed  Neurological:     Mental Status: She is alert.  Psychiatric:        Mood and Affect: Mood normal.        Behavior: Behavior normal.      UC Treatments / Results  Labs (all labs ordered are listed, but only abnormal results are displayed) Labs Reviewed  AEROBIC CULTURE (SUPERFICIAL SPECIMEN)    EKG   Radiology No results found.  Procedures Procedures (including critical care time)  Medications Ordered in UC Medications - No data to display  Initial Impression / Assessment and Plan / UC Course  I have reviewed the triage vital signs and the nursing notes.   Pertinent labs & imaging results that were available during my care of the patient were reviewed by me and considered in my medical decision making (see chart for details).      Final Clinical Impressions(s) / UC Diagnoses  Final diagnoses:  Bacterial folliculitis     Discharge Instructions     Take the antibiotic 2 x a day The culture report will take 2-3 days.  You can see it on My Chart   ED Prescriptions    Medication Sig Dispense Auth. Provider   doxycycline (VIBRAMYCIN) 100 MG capsule Take 1 capsule (100 mg total) by mouth 2 (two) times daily. 14 capsule Raylene Everts, MD     PDMP not reviewed this encounter.   Raylene Everts, MD 12/23/19 [phone]

## 2019-12-23 NOTE — Discharge Instructions (Addendum)
Take the antibiotic 2 x a day The culture report will take 2-3 days.  You can see it on My Chart

## 2019-12-23 NOTE — ED Triage Notes (Signed)
Pt c/o bumps that have been popping up on different body parts. Pt has a bump on right and left lower legs, left outer thigh and right buttocksx100mos. Pt states they are sore. Pt denies itching.

## 2019-12-25 LAB — AEROBIC CULTURE W GRAM STAIN (SUPERFICIAL SPECIMEN): Special Requests: NORMAL

## 2020-01-22 ENCOUNTER — Ambulatory Visit (HOSPITAL_COMMUNITY): Admission: EM | Admit: 2020-01-22 | Payer: Medicaid Other | Source: Home / Self Care

## 2020-01-22 ENCOUNTER — Other Ambulatory Visit: Payer: Self-pay

## 2020-01-28 ENCOUNTER — Other Ambulatory Visit: Payer: Self-pay | Admitting: Specialist

## 2020-01-28 DIAGNOSIS — Z1231 Encounter for screening mammogram for malignant neoplasm of breast: Secondary | ICD-10-CM

## 2020-02-08 ENCOUNTER — Ambulatory Visit: Payer: Medicaid Other

## 2020-02-11 ENCOUNTER — Ambulatory Visit (INDEPENDENT_AMBULATORY_CARE_PROVIDER_SITE_OTHER): Payer: Medicaid Other | Admitting: Licensed Clinical Social Worker

## 2020-02-11 ENCOUNTER — Other Ambulatory Visit: Payer: Self-pay

## 2020-02-11 DIAGNOSIS — F418 Other specified anxiety disorders: Secondary | ICD-10-CM
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
## 2020-03-06 ENCOUNTER — Other Ambulatory Visit: Payer: Self-pay

## 2020-03-06 ENCOUNTER — Ambulatory Visit (HOSPITAL_COMMUNITY): Payer: Medicaid Other | Admitting: Psychiatry

## 2020-03-06 ENCOUNTER — Ambulatory Visit (INDEPENDENT_AMBULATORY_CARE_PROVIDER_SITE_OTHER): Payer: Medicaid Other | Admitting: Licensed Clinical Social Worker

## 2020-03-06 DIAGNOSIS — F418 Other specified anxiety disorders: Secondary | ICD-10-CM

## 2020-03-08 NOTE — Progress Notes (Signed)
   THERAPIST PROGRESS NOTE  Session Time: 55 min  Participation Level: Active  Behavioral Response: CasualAlertAnxious and Dysphoric  Type of Therapy: Individual Therapy  Treatment Goals addressed: Coping  Interventions: Solution Focused and Supportive  Summary: [name] is a 44 y.o. female who presents with hx of anx/dep. Pt prefers to be called [name]. Pt comes for in person session, first session since initial eval. Pt immediately reports her father is in a surgical procedure at present d/t infected abscess on his spine. She states he is 44 yrs old, "very stubborn"  and has been refusing care for approx a wk until he became quite disoriented and was able to be kept at the hospital. Pt also states "And I lost my job today". Entire session devoted to these acute stressors. Pt feeling very overwhelmed and not sure what to do re father especially. She states "I am so emotionally drained". LCSW provided education and support on care options for father. Father was reportedly completely independent prior to this illness. He is a dialysis pt x 1 yr and pt states he was wanting to start to refuse dialysis causing discord. LCSW assisted to process thoughts and feelings. Addressed questions r/t Medicare and VA. Discussed advance directives and decisional capacity. LCSW assessed for supports. Pt reports she has 64 yr old twin siblings in [place] but they are both "sickly" r/t sickle cell disease and both have multiple young children. She is getting little support from her boyfriend and feels she is left to manage alone. Has 58 yr old grandmother who cannot provide practical support but she is able to talk to. Reviewed coping strategies, encouraged self care. Pt states she is [place] at [facility] [place] and [place]. Asked her to continue this practice even if it has to be shortened for the time being. Pt agrees. LCSW reviewed poc with pt's verbal agreement prior to close of  session. Pt states "I feel so much better".        Suicidal/Homicidal: Nowithout intent/plan  Therapist Response: Pt remains receptive to care.  Plan: Return again in 2 weeks.  Diagnosis: Axis I: Anxiety with depression    Axis II: Deferred  Hermine Messick, LCSW 03/08/2020

## 2020-03-13 ENCOUNTER — Ambulatory Visit (HOSPITAL_COMMUNITY): Payer: Self-pay | Admitting: Licensed Clinical Social Worker

## 2020-03-19 ENCOUNTER — Telehealth (HOSPITAL_COMMUNITY): Payer: Medicaid Other

## 2020-03-20 ENCOUNTER — Ambulatory Visit (HOSPITAL_COMMUNITY): Payer: Self-pay | Admitting: Licensed Clinical Social Worker

## 2020-03-27 ENCOUNTER — Ambulatory Visit (HOSPITAL_COMMUNITY): Payer: Self-pay | Admitting: Licensed Clinical Social Worker

## 2020-10-15 ENCOUNTER — Ambulatory Visit (HOSPITAL_COMMUNITY)
Admission: EM | Admit: 2020-10-15 | Discharge: 2020-10-15 | Disposition: A | Discharge status: Home / Self Care | Payer: Medicaid Other | Attending: Physician Assistant | Admitting: Physician Assistant

## 2020-10-15 ENCOUNTER — Other Ambulatory Visit: Payer: Self-pay

## 2020-10-15 ENCOUNTER — Encounter (HOSPITAL_COMMUNITY): Payer: Self-pay

## 2020-10-15 DIAGNOSIS — L089 Local infection of the skin and subcutaneous tissue, unspecified: Secondary | ICD-10-CM

## 2020-10-15 LAB — CBG MONITORING, ED: Glucose-Capillary: 348 mg/dL — ABNORMAL HIGH (ref 70–99)

## 2020-10-15 MED ORDER — HYDROXYZINE PAMOATE 25 MG PO CAPS: 25.0000 mg | ORAL_CAPSULE | Freq: Three times a day (TID) | ORAL | 0 refills | Status: DC | PRN

## 2020-10-15 MED ORDER — DOXYCYCLINE HYCLATE 100 MG PO CAPS: 100.0000 mg | ORAL_CAPSULE | Freq: Two times a day (BID) | ORAL | 0 refills | Status: DC

## 2020-10-15 NOTE — ED Provider Notes (Signed)
[place]    CSN: [id number] Arrival date & time: 10/15/20  0946      History   Chief Complaint Chief Complaint  Patient presents with  . Blister    HPI [name] is a 45 y.o. female.   Pt complains of blisters on her lower leg.  Pt is diabetic.  Pt does not have a provider currently   The history is provided by the patient. No language interpreter was used.  Rash Location:  Leg Leg rash location:  L lower leg and R lower leg Quality: blistering   Severity:  Moderate Onset quality:  Gradual Progression:  Worsening Chronicity:  New Relieved by:  Nothing Worsened by:  Nothing Ineffective treatments:  None tried   Past Medical History:  Diagnosis Date  . Bipolar 1 disorder ([place])   . Bronchitis   . Depressive disorder, not elsewhere classified   . Gestational diabetes   . Hidradenitis suppurativa   . Obesity   . Tobacco abuse     Patient Active Problem List   Diagnosis Date Noted  . Bipolar disorder, curr episode mixed, severe, w/o psychotic features ([place]) 08/16/2015  . Cannabis use disorder, severe, dependence ([place]) 08/13/2015  . Yeast vaginitis 11/07/2014  . Vulvovaginal candidiasis 10/16/2014  . Night sweats 02/22/2014  . 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 Asthma, chronic 03/12/2013  . Hyperlipidemia 12/10/2011  . DM (diabetes mellitus), type 2, uncontrolled ([place]) 12/07/2011  . HIDRADENITIS SUPPURATIVA 06/12/2009  . OBESITY, UNSPECIFIED 10/31/2008  . BACK PAIN, LUMBAR 10/31/2008  . TOBACCO DEPENDENCE 09/29/2006  . PAPANICOLAOU SMEAR, ABNORMAL 09/29/2006    Past Surgical History:  Procedure Laterality Date  . TUBAL LIGATION      OB History   No obstetric history on file.      Home Medications    Prior to Admission medications   Medication Sig Start Date End Date Taking? Authorizing Provider  doxycycline (VIBRAMYCIN) 100 MG capsule Take 1 capsule (100 mg total) by mouth 2 (two) times daily. 12/23/19   Raylene Everts, MD   Lancets [facility] ULTRASOFT) lancets Use as instructed. 06/18/14   Bernadene Bell, MD  LEVEMIR FLEXTOUCH 100 UNIT/ML Pen Inject 14 Units into the skin daily. 11/16/18   [provider]  Lurasidone HCl (LATUDA) 60 MG TABS Take 60 mg by mouth.    [provider]  naproxen sodium (ALEVE) 220 MG tablet Take 440-660 mg by mouth 2 (two) times daily as needed (headache/pain).    [provider]  albuterol (PROVENTIL HFA;VENTOLIN HFA) 108 (90 Base) MCG/ACT inhaler Inhale 2 puffs into the lungs every 6 (six) hours as needed for wheezing or shortness of breath. Patient not taking: Reported on 02/22/2019 07/26/17 12/23/19  Varney Biles, MD  glipiZIDE (GLUCOTROL) 10 MG tablet Take 1 tablet (10 mg total) by mouth 2 (two) times daily before a meal. Patient not taking: Reported on 02/22/2019 07/26/17 12/23/19  Varney Biles, MD  lamoTRIgine (LAMICTAL) 200 MG tablet Take 1 tablet (200 mg total) by mouth daily. Patient not taking: Reported on 02/22/2019 08/16/15 12/23/19  Derrill Center, NP  metFORMIN (GLUCOPHAGE) 500 MG tablet Take 1 tablet (500 mg total) by mouth 2 (two) times daily with a meal. Patient not taking: Reported on 02/22/2019 07/26/17 12/23/19  Varney Biles, MD  risperiDONE (RISPERDAL M-TABS) 3 MG disintegrating tablet Take 1 tablet (3 mg total) by mouth at bedtime. Patient not taking: Reported on 10/25/2015 08/16/15 12/23/19  Derrill Center, NP    Family History Family  History  Problem Relation Age of Onset  . Hypertension Mother   . Schizophrenia Mother   . Hypertension Father   . Diabetes Father     Social History Social History   Tobacco Use  . Smoking status: Current Every Day Smoker    Packs/day: 1.00    Types: Cigarettes  . Smokeless tobacco: Never Used  Substance Use Topics  . Alcohol use: Yes    Alcohol/week: 2.0 standard drinks    Types: 2 Shots of liquor per week  . Drug use: Yes    Types: Marijuana     Allergies   Patient has no known  allergies.   Review of Systems Review of Systems  Skin: Positive for rash.  All other systems reviewed and are negative.    Physical Exam Triage Vital Signs ED Triage Vitals  Enc Vitals Group     BP 10/15/20 1001 (!) 144/90     Pulse Rate 10/15/20 1001 72     Resp 10/15/20 1001 19     Temp 10/15/20 1001 97.8 F (36.6 C)     Temp src --      SpO2 10/15/20 1001 98 %     Weight --      Height --      Head Circumference --      Peak Flow --      Pain Score 10/15/20 0959 3     Pain Loc --      Pain Edu? --      Excl. in [place]? --    No data found.  Updated Vital Signs BP (!) 144/90   Pulse 72   Temp 97.8 F (36.6 C)   Resp 19   LMP 10/06/2020 (Approximate)   SpO2 98%   Visual Acuity Right Eye Distance:   Left Eye Distance:   Bilateral Distance:    Right Eye Near:   Left Eye Near:    Bilateral Near:     Physical Exam Vitals and nursing note reviewed.  Constitutional:      Appearance: She is well-developed.  HENT:     Head: Normocephalic.  Cardiovascular:     Rate and Rhythm: Normal rate.  Pulmonary:     Effort: Pulmonary effort is normal.  Musculoskeletal:        General: Normal range of motion.     Cervical back: Normal range of motion.  Skin:    Findings: Rash present.     Comments: 4 raised pustules lower legs, several dark pigmented scars   Neurological:     General: No focal deficit present.     Mental Status: She is alert and oriented to person, place, and time.  Psychiatric:        Mood and Affect: Mood normal.      UC Treatments / Results  Labs (all labs ordered are listed, but only abnormal results are displayed) Labs Reviewed  CBG MONITORING, ED    EKG   Radiology No results found.  Procedures Procedures (including critical care time)  Medications Ordered in UC Medications - No data to display  Initial Impression / Assessment and Plan / UC Course  I have reviewed the triage vital signs and the nursing notes.  Pertinent  labs & imaging results that were available during my care of the patient were reviewed by me and considered in my medical decision making (see chart for details).     MDM:  Pt given rx for doxycycline Final Clinical Impressions(s) / UC Diagnoses  Final diagnoses:  Skin infection   Discharge Instructions   None    ED Prescriptions    None     PDMP not reviewed this encounter.  An After Visit Summary was printed and given to the patient.    Fransico Meadow, [place] 10/15/20 1020

## 2020-10-15 NOTE — ED Triage Notes (Signed)
Pt in with c/o bilateral leg blisters  States that she has had this issue for awhile but it is more blisters now

## 2021-01-16 ENCOUNTER — Encounter (HOSPITAL_COMMUNITY): Payer: Self-pay | Admitting: Emergency Medicine

## 2021-01-16 ENCOUNTER — Other Ambulatory Visit: Payer: Self-pay

## 2021-01-16 ENCOUNTER — Ambulatory Visit (HOSPITAL_COMMUNITY)
Admission: EM | Admit: 2021-01-16 | Discharge: 2021-01-16 | Disposition: A | Discharge status: Home / Self Care | Payer: Medicaid Other | Attending: Medical Oncology | Admitting: Medical Oncology

## 2021-01-16 DIAGNOSIS — F1290 Cannabis use, unspecified, uncomplicated: Secondary | ICD-10-CM | POA: Diagnosis not present

## 2021-01-16 DIAGNOSIS — B373 Candidiasis of vulva and vagina: Secondary | ICD-10-CM | POA: Diagnosis not present

## 2021-01-16 DIAGNOSIS — F17210 Nicotine dependence, cigarettes, uncomplicated: Secondary | ICD-10-CM | POA: Insufficient documentation

## 2021-01-16 DIAGNOSIS — Z20822 Contact with and (suspected) exposure to covid-19: Secondary | ICD-10-CM | POA: Diagnosis not present

## 2021-01-16 DIAGNOSIS — N898 Other specified noninflammatory disorders of vagina: Secondary | ICD-10-CM | POA: Diagnosis not present

## 2021-01-16 DIAGNOSIS — Z760 Encounter for issue of repeat prescription: Secondary | ICD-10-CM | POA: Diagnosis not present

## 2021-01-16 DIAGNOSIS — J209 Acute bronchitis, unspecified: Secondary | ICD-10-CM | POA: Insufficient documentation

## 2021-01-16 MED ORDER — PREDNISONE 10 MG (21) PO TBPK: ORAL_TABLET | Freq: Every day | ORAL | 0 refills | Status: DC

## 2021-01-16 MED ORDER — PEN NEEDLES 31G X 8 MM MISC: 1.0000 | Freq: Three times a day (TID) | 0 refills | Status: DC

## 2021-01-16 MED ORDER — ALBUTEROL SULFATE HFA 108 (90 BASE) MCG/ACT IN AERS: 1.0000 | INHALATION_SPRAY | Freq: Four times a day (QID) | RESPIRATORY_TRACT | 0 refills | Status: DC | PRN

## 2021-01-16 NOTE — ED Provider Notes (Addendum)
[name]    CSN: [id number] Arrival date & time: 01/16/21  [place]      History   Chief Complaint Chief Complaint  Patient presents with   Vaginal Itching    HPI [name] is a 45 y.o. female.   HPI  Vaginal Itching: Patient reports that she has had vaginal itching for 1 to 2 months.  She states that she has tried various over-the-counter products for yeast infections without much improvement.  No new partners of concern, pelvic pain, fevers or symptoms of a urinary tract infection.  Cough: Patient has a history of bronchitis without history of asthma or COPD.  She states that since Sunday she has had a dry and productive cough.  No hemoptysis.  She denies any fever, chest pain, shortness of breath but has had some wheezing.  In the past she has done well with albuterol and prednisone. NO known sick contacts. Defers chest x ray. No recent hospitalization.   She would like a refill of her insulin needles.   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      Home Medications    Prior to Admission medications   Medication Sig Start Date End Date Taking? Authorizing Provider  doxycycline (VIBRAMYCIN) 100 MG capsule Take 1 capsule (100 mg total) by mouth 2 (two) times daily. 10/15/20   Fransico Meadow, PA-C  hydrOXYzine (VISTARIL) 25 MG capsule Take 1 capsule (25 mg total) by mouth 3 (three) times daily as needed. 10/15/20   Fransico Meadow, PA-C  Lancets [facility] ULTRASOFT) lancets Use as instructed. 06/18/14   Bernadene Bell, MD  LEVEMIR FLEXTOUCH 100 UNIT/ML Pen Inject 14 Units into the skin daily. 11/16/18   [provider]  Lurasidone HCl (LATUDA) 60 MG TABS Take 60 mg by mouth.    [provider]  naproxen sodium (ALEVE) 220 MG tablet Take 440-660 mg by mouth 2 (two) times daily as needed (headache/pain).    [provider]  albuterol (PROVENTIL HFA;VENTOLIN HFA) 108 (90 Base) MCG/ACT inhaler Inhale 2 puffs into the lungs every 6 (six) hours as needed for wheezing or shortness of breath. Patient not taking: Reported on 02/22/2019 07/26/17 12/23/19  Varney Biles, MD  glipiZIDE (GLUCOTROL) 10 MG tablet Take 1 tablet (10 mg total) by mouth 2 (two) times daily before a meal. Patient not taking: Reported on 02/22/2019 07/26/17 12/23/19  Varney Biles, MD  lamoTRIgine (LAMICTAL) 200 MG tablet Take 1 tablet (200 mg total) by mouth daily. Patient not taking: Reported on 02/22/2019  08/16/15 12/23/19  Derrill Center, NP  metFORMIN (GLUCOPHAGE) 500 MG tablet Take 1 tablet (500 mg total) by mouth 2 (two) times daily with a meal. Patient not taking: Reported on 02/22/2019 07/26/17 12/23/19  Varney Biles, MD  risperiDONE (RISPERDAL M-TABS) 3 MG disintegrating tablet Take 1 tablet (3 mg total) by mouth at bedtime. Patient not taking: Reported on 10/25/2015 08/16/15 12/23/19  Derrill Center, NP    Family History Family History  Problem Relation Age of Onset   Hypertension Mother    Schizophrenia Mother     Hypertension Father    Diabetes Father     Social History Social History   Tobacco Use   Smoking status: Every Day    Packs/day: 1.00    Pack years: 0.00    Types: Cigarettes   Smokeless tobacco: Never  Substance Use Topics   Alcohol use: Yes    Alcohol/week: 2.0 standard drinks    Types: 2 Shots of liquor per week   Drug use: Yes    Types: Marijuana     Allergies   Patient has no known allergies.   Review of Systems Review of Systems  As stated above in HPI Physical Exam Triage Vital Signs ED Triage Vitals  Enc Vitals Group     BP 01/16/21 1816 117/82     Pulse Rate 01/16/21 1816 92     Resp 01/16/21 1816 17     Temp 01/16/21 1816 99.6 F (37.6 C)     Temp Source 01/16/21 1816 Oral     SpO2 01/16/21 1816 96 %     Weight --      Height --      Head Circumference --      Peak Flow --      Pain Score 01/16/21 1811 0     Pain Loc --      Pain Edu? --      Excl. in [place]? --    No data found.  Updated Vital Signs BP 117/82 (BP Location: Left Arm)   Pulse 92   Temp 99.6 F (37.6 C) (Oral)   Resp 17   LMP 12/24/2020   SpO2 96%   Physical Exam Vitals and nursing note reviewed.  Constitutional:      General: She is not in acute distress.    Appearance: Normal appearance. She is not ill-appearing, toxic-appearing or diaphoretic.  HENT:     Head: Normocephalic and atraumatic.     Right Ear: Tympanic membrane and ear canal normal.     Left Ear: Tympanic membrane and ear canal normal.     Nose: Congestion and rhinorrhea present.     Mouth/Throat:     Mouth: Mucous membranes are moist.     Pharynx: Oropharynx is clear. No oropharyngeal exudate or posterior oropharyngeal erythema.  Eyes:     Extraocular Movements: Extraocular movements intact.     Pupils: Pupils are equal, round, and reactive to light.  Cardiovascular:     Rate and Rhythm: Normal rate and regular rhythm.     Heart sounds: Normal heart sounds.  Pulmonary:     Effort: Pulmonary effort is  normal. No respiratory distress.     Breath sounds: No stridor. Wheezing (few scattered) present. No rhonchi or rales.  Chest:     Chest wall: No tenderness.  Abdominal:     Palpations: Abdomen is soft.  Musculoskeletal:     Cervical back: Normal range of motion and neck supple.  Lymphadenopathy:  Cervical: No cervical adenopathy.  Skin:    General: Skin is warm.     Coloration: Skin is not jaundiced or pale.  Neurological:     Mental Status: She is alert and oriented to person, place, and time.     UC Treatments / Results  Labs (all labs ordered are listed, but only abnormal results are displayed) Labs Reviewed  CERVICOVAGINAL ANCILLARY ONLY    EKG   Radiology No results found.  Procedures Procedures (including critical care time)  Medications Ordered in UC Medications - No data to display  Initial Impression / Assessment and Plan / UC Course  I have reviewed the triage vital signs and the nursing notes.  Pertinent labs & imaging results that were available during my care of the patient were reviewed by me and considered in my medical decision making (see chart for details).     New.  In terms of her vaginal discharge we are going to perform a cytology testing for patient.  We will treat appropriately.  In terms of her cough, send in Tessalon and albuterol as well as prednisone.  Discussed how to use along with common potential side effects and precautions.  COVID-19 testing pending.  Discussed red flag signs and symptoms.  I have refilled her insulin needles. Final Clinical Impressions(s) / UC Diagnoses   Final diagnoses:  None   Discharge Instructions   None    ED Prescriptions   None    PDMP not reviewed this encounter.   Hughie Closs, [place]hal [place] 01/16/21 [place], [place], PA-C 01/16/21 1841    Hughie Closs, PA-C 01/16/21 1843    Hughie Closs, PA-C 01/25/21 1007

## 2021-01-16 NOTE — ED Triage Notes (Signed)
Pt presents with vaginal irritation and burning xs 1-2 months. States has used multiple OTC medications with no relief. States symptoms get worse before cycle.   Pt also c/o of dry cough xs 1 week. States inhaler has expired.

## 2021-01-17 ENCOUNTER — Telehealth (HOSPITAL_COMMUNITY): Payer: Self-pay

## 2021-01-17 LAB — SARS CORONAVIRUS 2 (TAT 6-24 HRS): SARS Coronavirus 2: NEGATIVE

## 2021-01-17 MED ORDER — ALBUTEROL SULFATE HFA 108 (90 BASE) MCG/ACT IN AERS: 1.0000 | INHALATION_SPRAY | Freq: Four times a day (QID) | RESPIRATORY_TRACT | 0 refills | Status: DC | PRN

## 2021-01-17 MED ORDER — PEN NEEDLES 31G X 8 MM MISC: 1.0000 | Freq: Three times a day (TID) | 0 refills | Status: AC

## 2021-01-17 MED ORDER — PREDNISONE 10 MG (21) PO TBPK: ORAL_TABLET | Freq: Every day | ORAL | 0 refills | Status: DC

## 2021-01-17 NOTE — Telephone Encounter (Signed)
Pt phone call returned. Pt RX sent to [facility] on [address] as requested.

## 2021-01-19 LAB — CERVICOVAGINAL ANCILLARY ONLY
Bacterial Vaginitis (gardnerella): POSITIVE — AB
Candida Glabrata: NEGATIVE
Candida Vaginitis: POSITIVE — AB
Chlamydia: NEGATIVE
Comment: NEGATIVE
Comment: NEGATIVE
Comment: NEGATIVE
Comment: NEGATIVE
Comment: NEGATIVE
Comment: NORMAL
Neisseria Gonorrhea: NEGATIVE
Trichomonas: NEGATIVE

## 2021-01-20 ENCOUNTER — Telehealth (HOSPITAL_COMMUNITY): Payer: Self-pay | Admitting: Emergency Medicine

## 2021-01-23 ENCOUNTER — Telehealth (HOSPITAL_COMMUNITY): Payer: Self-pay | Admitting: Emergency Medicine

## 2021-01-23 MED ORDER — FLUCONAZOLE 150 MG PO TABS: 150.0000 mg | ORAL_TABLET | Freq: Once | ORAL | 0 refills | Status: AC

## 2021-01-23 MED ORDER — METRONIDAZOLE 500 MG PO TABS: 500.0000 mg | ORAL_TABLET | Freq: Two times a day (BID) | ORAL | 0 refills | Status: DC

## 2021-02-22 ENCOUNTER — Ambulatory Visit (HOSPITAL_COMMUNITY)
Admission: EM | Admit: 2021-02-22 | Discharge: 2021-02-22 | Disposition: A | Discharge status: Home / Self Care | Payer: Medicaid Other | Attending: Medical Oncology | Admitting: Medical Oncology

## 2021-02-22 ENCOUNTER — Encounter (HOSPITAL_COMMUNITY): Payer: Self-pay | Admitting: *Deleted

## 2021-02-22 ENCOUNTER — Other Ambulatory Visit: Payer: Self-pay

## 2021-02-22 DIAGNOSIS — R55 Syncope and collapse: Secondary | ICD-10-CM | POA: Diagnosis not present

## 2021-02-22 DIAGNOSIS — Z7984 Long term (current) use of oral hypoglycemic drugs: Secondary | ICD-10-CM | POA: Diagnosis not present

## 2021-02-22 DIAGNOSIS — E1165 Type 2 diabetes mellitus with hyperglycemia: Secondary | ICD-10-CM | POA: Diagnosis not present

## 2021-02-22 LAB — POCT URINALYSIS DIPSTICK, ED / UC
Glucose, UA: >=1000 mg/dL — AB
Ketones, ur: NEGATIVE mg/dL
Leukocytes,Ua: NEGATIVE
Nitrite: NEGATIVE
Protein, ur: 100 mg/dL — AB
Specific Gravity, Urine: 1.025 (ref 1.005–1.030)
Urobilinogen, UA: 1 mg/dL (ref 0.0–1.0)
pH: 6 (ref 5.0–8.0)

## 2021-02-22 LAB — CBG MONITORING, ED: Glucose-Capillary: 258 mg/dL — ABNORMAL HIGH (ref 70–99)

## 2021-02-22 MED ORDER — BLOOD GLUCOSE MONITOR KIT: PACK | 0 refills | Status: AC

## 2021-02-22 NOTE — ED Triage Notes (Signed)
HA.Light headed ,nausea Pt reports she does not feel right. Pt passed out 2 weeks ago.

## 2021-02-22 NOTE — ED Provider Notes (Addendum)
[place]    CSN: [id number] Arrival date & time: 02/22/21  1305      History   Chief Complaint Chief Complaint  Patient presents with   Hypertension   Near Syncope    HPI [name] is a 45 y.o. female.   HPI  Near Syncope: Pt reports that for the past 2 weeks she has felt unwell- lightheaded at times, nauseated at times, headache at times. She reports that she has DM2 and is on BID insulin. She reports that she does not monitor her glucose levels and usually only gives herself one insulin administration per day. She states that 2 weeks ago she was in the park and fainted. She does not know if she hit hear head but did have LOC. She is unsure why she fainted just that she felt lightheaded and weak beforehand. She has not had anything to eat before that. She has not had a reoccurrence of symptoms since. No chest pains, SOB, dysuria.   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      Home Medications    Prior to Admission medications    Medication Sig Start Date End Date Taking? Authorizing Provider  albuterol (VENTOLIN HFA) 108 (90 Base) MCG/ACT inhaler Inhale 1-2 puffs into the lungs every 6 (six) hours as needed for wheezing or shortness of breath. 01/17/21  Yes Alyah Boehning M, PA-C  Insulin Pen Needle (PEN NEEDLES) 31G X 8 MM MISC 1 each by Does not apply route 3 (three) times daily. 01/17/21  Yes Breonna Gafford, Judson Roch M, PA-C  Lancets [facility] ULTRASOFT) lancets Use as instructed. 06/18/14  Yes Bernadene Bell, MD  doxycycline (VIBRAMYCIN) 100 MG capsule Take 1 capsule (100 mg total) by mouth 2 (two) times daily. 10/15/20   Fransico Meadow, PA-C  hydrOXYzine (VISTARIL) 25 MG capsule Take 1 capsule (25 mg total) by mouth 3 (three) times daily as needed. 10/15/20   Fransico Meadow, PA-C  LEVEMIR FLEXTOUCH 100 UNIT/ML Pen Inject 14 Units into the skin daily. 11/16/18   [provider]  Lurasidone HCl (LATUDA) 60 MG TABS Take 60 mg by mouth.    [provider]  metroNIDAZOLE (FLAGYL) 500 MG tablet Take 1 tablet (500 mg total) by mouth 2 (two) times daily. 01/23/21   Chase Picket, MD  naproxen sodium (ALEVE) 220 MG tablet Take 440-660 mg by mouth 2 (two) times daily as needed (headache/pain).    [provider]  predniSONE (STERAPRED UNI-PAK 21 TAB) 10 MG (21) TBPK tablet Take by  mouth daily. Take 6 tabs by mouth daily  for 2 days, then 5 tabs for 2 days, then 4 tabs for 2 days, then 3 tabs for 2 days, 2 tabs for 2 days, then 1 tab by mouth daily for 2 days 01/17/21   Hughie Closs, PA-C  glipiZIDE (GLUCOTROL) 10 MG tablet Take 1 tablet (10 mg total) by mouth 2 (two) times daily before a meal. Patient not taking: Reported on 02/22/2019 07/26/17 12/23/19  Varney Biles, MD  lamoTRIgine (LAMICTAL) 200 MG tablet Take 1 tablet (200 mg total) by mouth daily. Patient not taking: Reported on 02/22/2019 08/16/15 12/23/19  Derrill Center, NP  metFORMIN (GLUCOPHAGE) 500 MG tablet Take 1 tablet (500 mg total) by  mouth 2 (two) times daily with a meal. Patient not taking: Reported on 02/22/2019 07/26/17 12/23/19  Varney Biles, MD  risperiDONE (RISPERDAL M-TABS) 3 MG disintegrating tablet Take 1 tablet (3 mg total) by mouth at bedtime. Patient not taking: Reported on 10/25/2015 08/16/15 12/23/19  Derrill Center, NP    Family History Family History  Problem Relation Age of Onset   Hypertension Mother    Schizophrenia Mother    Hypertension Father    Diabetes Father     Social History Social History   Tobacco Use   Smoking status: Every Day    Packs/day: 1.00    Types: Cigarettes   Smokeless tobacco: Never  Substance Use Topics   Alcohol use: Yes    Alcohol/week: 2.0 standard drinks    Types: 2 Shots of liquor per week   Drug use: Yes    Types: Marijuana     Allergies   Patient has no known allergies.   Review of Systems Review of Systems  As stated above in HPI Physical Exam Triage Vital Signs ED Triage Vitals  Enc Vitals Group     BP 02/22/21 1422 129/68     Pulse Rate 02/22/21 1422 65     Resp 02/22/21 1422 20     Temp 02/22/21 1422 98.9 F (37.2 C)     Temp src --      SpO2 02/22/21 1422 100 %     Weight --      Height --      Head Circumference --      Peak Flow --      Pain Score 02/22/21 1423 4     Pain Loc --      Pain Edu? --      Excl. in [place]? --    No data found.  Updated Vital Signs BP 129/68   Pulse 65   Temp 98.9 F (37.2 C)   Resp 20   LMP 02/22/2021 (Exact Date)   SpO2 100%   Physical Exam Vitals and nursing note reviewed.  Constitutional:      General: She is not in acute distress.    Appearance: Normal appearance. She is not ill-appearing, toxic-appearing or diaphoretic.  HENT:     Head: Normocephalic and atraumatic.     Mouth/Throat:     Mouth: Mucous membranes are moist.  Eyes:     Extraocular Movements: Extraocular movements intact.     Pupils: Pupils are equal, round, and reactive to light.     Comments: No pallor of  conjunctiva  Neck:     Vascular: No carotid bruit.  Cardiovascular:     Rate and Rhythm: Normal rate and regular rhythm.     Pulses: Normal pulses.     Heart sounds:  Normal heart sounds.  Pulmonary:     Effort: Pulmonary effort is normal.     Breath sounds: Normal breath sounds.  Abdominal:     Palpations: Abdomen is soft.  Musculoskeletal:     Cervical back: Normal range of motion and neck supple. No rigidity or tenderness.  Lymphadenopathy:     Cervical: No cervical adenopathy.  Skin:    General: Skin is warm.     Capillary Refill: Capillary refill takes less than 2 seconds.     Coloration: Skin is not jaundiced or pale.     Findings: No bruising, erythema, lesion or rash.  Neurological:     Mental Status: She is alert and oriented to person, place, and time.     Cranial Nerves: No cranial nerve deficit.     Sensory: No sensory deficit.     Motor: No weakness.     Coordination: Coordination normal.     Gait: Gait normal.     Deep Tendon Reflexes: Reflexes normal.  Psychiatric:        Mood and Affect: Mood normal.        Behavior: Behavior normal.        Thought Content: Thought content normal.        Judgment: Judgment normal.     UC Treatments / Results  Labs (all labs ordered are listed, but only abnormal results are displayed) Labs Reviewed  CBG MONITORING, ED  POCT URINALYSIS DIPSTICK, ED / UC    EKG   Radiology No results found.  Procedures Procedures (including critical care time)  Medications Ordered in UC Medications - No data to display  Initial Impression / Assessment and Plan / UC Course  I have reviewed the triage vital signs and the nursing notes.  Pertinent labs & imaging results that were available during my care of the patient were reviewed by me and considered in my medical decision making (see chart for details).      I spent 40 minutes dedicated to the care of this patient (face to face and non-face to face) on the date of this  encounter to include the information below.   New.  Wide differential but likely secondary to her unsafe use of insulin.  We discussed the importance of checking her glucose values before administering her insulin.  We also discussed red flag ranges for insulin use as well as the importance of eating regular low-carb meals and staying hydrated with water.  I have also recommended that she follow-up with her primary care provider to discuss her concerns regarding her symptoms and her insulin use.  For now I am sending her in a glucometer and testing supplies that she can monitor closely at home.  Discussed red flag signs and symptoms. Should she get visual changes, reoccurrence of headache she has been advised to go to the ER.    Final Clinical Impressions(s) / UC Diagnoses   Final diagnoses:  None   Discharge Instructions   None    ED Prescriptions   None    PDMP not reviewed this encounter.   Vallery Sa 02/22/21 1532    Hughie Closs, PA-C 02/22/21 [place], PA-C 02/22/21 1534

## 2021-02-22 NOTE — ED Notes (Signed)
EGG given to provider

## 2021-03-03 ENCOUNTER — Emergency Department (HOSPITAL_COMMUNITY)
Admission: EM | Admit: 2021-03-03 | Discharge: 2021-03-03 | Disposition: A | Discharge status: Home / Self Care | Payer: Medicaid Other | Attending: Emergency Medicine | Admitting: Emergency Medicine

## 2021-03-03 ENCOUNTER — Other Ambulatory Visit: Payer: Self-pay

## 2021-03-03 ENCOUNTER — Encounter (HOSPITAL_COMMUNITY): Payer: Self-pay | Admitting: Emergency Medicine

## 2021-03-03 DIAGNOSIS — Z794 Long term (current) use of insulin: Secondary | ICD-10-CM | POA: Diagnosis not present

## 2021-03-03 DIAGNOSIS — E1165 Type 2 diabetes mellitus with hyperglycemia: Secondary | ICD-10-CM | POA: Insufficient documentation

## 2021-03-03 DIAGNOSIS — R739 Hyperglycemia, unspecified: Secondary | ICD-10-CM

## 2021-03-03 DIAGNOSIS — J45909 Unspecified asthma, uncomplicated: Secondary | ICD-10-CM | POA: Insufficient documentation

## 2021-03-03 DIAGNOSIS — R079 Chest pain, unspecified: Secondary | ICD-10-CM | POA: Insufficient documentation

## 2021-03-03 DIAGNOSIS — F17210 Nicotine dependence, cigarettes, uncomplicated: Secondary | ICD-10-CM | POA: Diagnosis not present

## 2021-03-03 DIAGNOSIS — Z7984 Long term (current) use of oral hypoglycemic drugs: Secondary | ICD-10-CM | POA: Diagnosis not present

## 2021-03-03 DIAGNOSIS — E119 Type 2 diabetes mellitus without complications: Secondary | ICD-10-CM

## 2021-03-03 LAB — BASIC METABOLIC PANEL
Anion gap: 9 (ref 5–15)
BUN: 9 mg/dL (ref 6–20)
CO2: 20 mmol/L — ABNORMAL LOW (ref 22–32)
Calcium: 9.1 mg/dL (ref 8.9–10.3)
Chloride: 101 mmol/L (ref 98–111)
Creatinine, Ser: 0.7 mg/dL (ref 0.44–1.00)
GFR, Estimated: >60 mL/min (ref >60)
Glucose, Bld: 194 mg/dL — ABNORMAL HIGH (ref 70–99)
Potassium: 3.8 mmol/L (ref 3.5–5.1)
Sodium: 130 mmol/L — ABNORMAL LOW (ref 135–145)

## 2021-03-03 LAB — URINALYSIS, ROUTINE W REFLEX MICROSCOPIC
Bilirubin Urine: NEGATIVE
Glucose, UA: 50 mg/dL — AB
Hgb urine dipstick: NEGATIVE
Ketones, ur: 5 mg/dL — AB
Leukocytes,Ua: NEGATIVE
Nitrite: NEGATIVE
Protein, ur: NEGATIVE mg/dL
Specific Gravity, Urine: 1.025 (ref 1.005–1.030)
pH: 6 (ref 5.0–8.0)

## 2021-03-03 LAB — CBC
HCT: 34.2 % — ABNORMAL LOW (ref 36.0–46.0)
Hemoglobin: 11.4 g/dL — ABNORMAL LOW (ref 12.0–15.0)
MCH: 21.2 pg — ABNORMAL LOW (ref 26.0–34.0)
MCHC: 33.3 g/dL (ref 30.0–36.0)
MCV: 63.7 fL — ABNORMAL LOW (ref 80.0–100.0)
Platelets: 547 10*3/uL — ABNORMAL HIGH (ref 150–400)
RBC: 5.37 MIL/uL — ABNORMAL HIGH (ref 3.87–5.11)
RDW: 19.6 % — ABNORMAL HIGH (ref 11.5–15.5)
WBC: 8.2 10*3/uL (ref 4.0–10.5)
nRBC: 0 % (ref 0.0–0.2)

## 2021-03-03 LAB — CBG MONITORING, ED
Glucose-Capillary: 209 mg/dL — ABNORMAL HIGH (ref 70–99)
Glucose-Capillary: 207 mg/dL — ABNORMAL HIGH (ref 70–99)

## 2021-03-03 LAB — I-STAT BETA HCG BLOOD, ED (MC, WL, AP ONLY): I-stat hCG, quantitative: <5 m[IU]/mL (ref <5)

## 2021-03-03 LAB — TROPONIN I (HIGH SENSITIVITY)
Troponin I (High Sensitivity): 3 ng/L (ref <18)
Troponin I (High Sensitivity): 3 ng/L (ref <18)

## 2021-03-03 NOTE — ED Triage Notes (Signed)
Pt c/o changes to her vision and increased blood sugars at home. Seen at [facility] for similar symptoms.

## 2021-03-03 NOTE — ED Provider Notes (Signed)
Emergency Medicine Provider Triage Evaluation Note  [name] , a 45 y.o. female  was evaluated in triage.  Pt complains of chest pain last night. States her bs have been high. She has not been taking her insulin  Review of Systems  Positive: Chest pain, hyperglycemia, blurred vision Negative: fever  Physical Exam  BP (!) 153/89   Pulse 85   Temp 99 F (37.2 C) (Oral)   Resp 14   LMP 02/22/2021 (Exact Date)   SpO2 100%  Gen:   Awake, no distress   Resp:  Normal effort  MSK:   Moves extremities without difficulty  Other:  Clear speech, moving all extremities  Medical Decision Making  Medically screening exam initiated at 5:05 PM.  Appropriate orders placed.  [name] was informed that the remainder of the evaluation will be completed by another provider, this initial triage assessment does not replace that evaluation, and the importance of remaining in the ED until their evaluation is complete.     Rodney Booze, [place] 03/03/21 1707    Wyvonnia Dusky, MD 03/03/21 [phone]

## 2021-03-03 NOTE — Discharge Instructions (Addendum)
Please keep appointment with your ophthalmologist.  Please get an appointment with a primary care doctor.  Can try following [facility] with [facility].  Continue your insulin as previously prescribed.

## 2021-03-13 NOTE — ED Provider Notes (Signed)
[facility] EMERGENCY DEPARTMENT Provider Note   CSN: [id number] Arrival date & time: 03/03/21  1645     History Chief Complaint  Patient presents with   Hyperglycemia    [name] is a 45 y.o. female.  Presented to ER with multiple concerns.  Patient reports that she is concerned that her blood sugars have been running generally high.  Has not been taking insulin that was previously prescribed.  Type 2 diabetes.  States that she also feels like her vision is slightly blurry.  Both eyes, ongoing for many weeks.  No changes today.  Endorses new chest pain.  Pain central, nonradiating, moderate, no alleviating or aggravating factors.  Ongoing since last night.  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     Family History  Problem Relation Age of Onset   Hypertension Mother    Schizophrenia Mother    Hypertension Father    Diabetes Father     Social History   Tobacco Use   Smoking status: Every Day    Packs/day: 1.00     Types: Cigarettes   Smokeless tobacco: Never  Substance Use Topics   Alcohol use: Yes    Alcohol/week: 2.0 standard drinks    Types: 2 Shots of liquor per week   Drug use: Yes    Types: Marijuana    Home Medications Prior to Admission medications   Medication Sig Start Date End Date Taking? Authorizing Provider  albuterol (VENTOLIN HFA) 108 (90 Base) MCG/ACT inhaler Inhale 1-2 puffs into the lungs every 6 (six) hours as needed for wheezing or shortness of breath. 01/17/21   Hughie Closs, PA-C  blood glucose meter kit and supplies KIT Dispense based on patient and insurance preference. Use up to four times daily as directed. 02/22/21   Hughie Closs, PA-C  doxycycline (VIBRAMYCIN) 100 MG capsule Take 1 capsule (100 mg total) by mouth 2 (two) times daily. 10/15/20   Fransico Meadow, PA-C  hydrOXYzine (VISTARIL) 25 MG capsule Take 1 capsule (25 mg total) by mouth 3 (three) times daily as needed. 10/15/20   Fransico Meadow, PA-C  Insulin Pen Needle (PEN NEEDLES) 31G X 8 MM MISC 1 each by Does not apply route 3 (three) times daily. 01/17/21   Hughie Closs, PA-C  Lancets [facility] ULTRASOFT) lancets Use as instructed. 06/18/14   Bernadene Bell, MD  LEVEMIR FLEXTOUCH 100 UNIT/ML Pen Inject 14 Units into the skin daily. 11/16/18   [provider]  Lurasidone HCl (LATUDA) 60 MG TABS Take 60 mg by mouth.    [provider]  metroNIDAZOLE (FLAGYL) 500 MG tablet Take 1 tablet (500 mg total) by mouth 2 (two) times daily. 01/23/21   Chase Picket, MD  naproxen sodium (ALEVE) 220 MG tablet Take 440-660 mg by mouth 2 (two) times daily as needed (headache/pain).    [provider]  predniSONE (STERAPRED UNI-PAK 21 TAB) 10 MG (21) TBPK tablet Take by mouth daily. Take 6 tabs by mouth daily  for 2 days, then 5 tabs for 2 days, then 4 tabs for 2 days, then 3 tabs for 2 days, 2 tabs for 2 days, then 1 tab by mouth daily for 2 days 01/17/21   Hughie Closs, PA-C   glipiZIDE (GLUCOTROL) 10 MG tablet Take 1 tablet (10 mg total) by mouth 2 (two) times daily before a meal. Patient not taking: Reported on 02/22/2019 07/26/17 12/23/19  Varney Biles, MD  lamoTRIgine (LAMICTAL) 200 MG tablet Take 1 tablet (200 mg total) by mouth daily. Patient not taking: Reported on 02/22/2019 08/16/15 12/23/19  Derrill Center, NP  metFORMIN (GLUCOPHAGE) 500 MG tablet Take 1 tablet (500 mg total) by mouth 2 (two) times daily with a meal. Patient not taking: Reported on 02/22/2019 07/26/17 12/23/19  Varney Biles, MD  risperiDONE (RISPERDAL M-TABS) 3 MG disintegrating tablet Take 1 tablet (3 mg total) by mouth at bedtime. Patient not taking: Reported on 10/25/2015 08/16/15 12/23/19  Derrill Center, NP    Allergies    Patient has no known allergies.  Review of Systems   Review of Systems  Constitutional:  Negative for chills and fever.  HENT:  Negative for ear pain and sore throat.   Eyes:  Positive for visual disturbance. Negative for pain.  Respiratory:  Negative for cough and shortness of breath.   Cardiovascular:  Positive for chest pain. Negative for palpitations.  Gastrointestinal:  Negative for abdominal pain and vomiting.  Genitourinary:  Negative for dysuria and hematuria.  Musculoskeletal:  Negative for arthralgias and back pain.  Skin:  Negative for color change and rash.  Neurological:  Negative for seizures and syncope.  All other systems reviewed and are negative.  Physical Exam Updated Vital Signs BP (!) 139/92   Pulse 68   Temp 99 F (37.2 C) (Oral)   Resp 12   LMP 02/22/2021 (Exact Date)   SpO2 100%   Physical Exam Vitals and nursing note reviewed.  Constitutional:      General: She is not in acute distress.    Appearance: She is well-developed.  HENT:     Head: Normocephalic and atraumatic.  Eyes:     Conjunctiva/sclera: Conjunctivae normal.  Cardiovascular:     Rate and Rhythm: Normal rate and regular rhythm.     Heart sounds: No murmur  heard. Pulmonary:     Effort: Pulmonary effort is normal. No respiratory distress.     Breath sounds: Normal breath sounds.  Abdominal:     Palpations: Abdomen is soft.     Tenderness: There is no abdominal tenderness.  Musculoskeletal:        General: No deformity or signs of injury.     Cervical back: Neck supple.  Skin:    General: Skin is warm and dry.  Neurological:     General: No focal deficit present.     Mental Status: She is alert and oriented to person, place, and time.     Comments: Visual fields intact  Psychiatric:        Mood and Affect: Mood normal.    ED Results / Procedures / Treatments   Labs (all labs ordered are listed, but only abnormal results are displayed) Labs Reviewed  BASIC METABOLIC PANEL - Abnormal; Notable for the following components:      Result Value   Sodium 130 (*)    CO2 20 (*)    Glucose, Bld 194 (*)    All other components within normal limits  CBC - Abnormal; Notable for the following components:   RBC 5.37 (*)    Hemoglobin 11.4 (*)    HCT 34.2 (*)    MCV 63.7 (*)    MCH 21.2 (*)    RDW 19.6 (*)    Platelets 547 (*)    All other components within normal limits  URINALYSIS, ROUTINE W REFLEX MICROSCOPIC - Abnormal; Notable for the following components:   APPearance HAZY (*)    Glucose, UA 50 (*)    Ketones, ur 5 (*)    All other components within normal limits  CBG MONITORING, ED - Abnormal; Notable for the following components:   Glucose-Capillary 209 (*)    All other components within normal limits  CBG MONITORING, ED - Abnormal; Notable for the following components:   Glucose-Capillary 207 (*)    All other components within normal limits  I-STAT BETA HCG BLOOD, ED ([facility], [facility], [facility] ONLY)  TROPONIN I (HIGH SENSITIVITY)  TROPONIN I (HIGH SENSITIVITY)    EKG EKG Interpretation  Date/Time:  Tuesday March 03 2021 19:21:20 EDT Ventricular Rate:  77 PR Interval:  151 QRS Duration: 75 QT Interval:  355 QTC Calculation: 402 R  Axis:   85 Text Interpretation: Sinus rhythm No acute changes Confirmed by Addison Lank ([id number]) on 03/05/2021 9:49:42 AM  Radiology No results found.  Procedures Procedures   Medications Ordered in ED Medications - No data to display  ED Course  I have reviewed the triage vital signs and the nursing notes.  Pertinent labs & imaging results that were available during my care of the patient were reviewed by me and considered in my medical decision making (see chart for details).    MDM Rules/Calculators/A&P                           Presents to ER with concern for chief complaint of elevated blood sugars, chest pain.  On review of systems and also endorsed some change in vision.  Regarding the sugars.  Patient has history of diabetes, reports being prescribed insulin.  Based on blood work today, patient is not in DKA.  Recommend that she follow-up with a primary care doctor to discuss management of diabetes.  Regarding chest pain, EKG without acute ischemic change, troponin is within normal limits.  No ongoing symptoms on reassessment.  Believe she is appropriate for discharge and follow-up with outpatient primary care for this concern.  Regarding the reported vision change.  Ongoing for quite some time.  No loss of vision, vision grossly intact today.  No acute changes today.  Reports already having scheduled appointment with an eye doctor.  Recommend she keep this appt.  Reviewed return precautions and discharged.    After the discussed management above, the patient was determined to be safe for discharge.  The patient was in agreement with this plan and all questions regarding their care were answered.  ED return precautions were discussed and the patient will return to  the ED with any significant worsening of condition.  Final Clinical Impression(s) / ED Diagnoses Final diagnoses:  Hyperglycemia  Type 2 diabetes mellitus without complication, with long-term current use of insulin  [facility])    Rx / DC Orders ED Discharge Orders     None        Lucrezia Starch, MD 03/13/21 [phone]

## 2021-03-25 ENCOUNTER — Other Ambulatory Visit: Payer: Self-pay | Admitting: Internal Medicine

## 2021-03-25 DIAGNOSIS — Z0000 Encounter for general adult medical examination without abnormal findings: Secondary | ICD-10-CM

## 2021-10-30 ENCOUNTER — Ambulatory Visit: Payer: Medicaid Other | Admitting: Internal Medicine

## 2023-02-13 ENCOUNTER — Emergency Department (HOSPITAL_COMMUNITY)
Admission: EM | Admit: 2023-02-13 | Discharge: 2023-02-13 | Disposition: A | Discharge status: Home / Self Care | Payer: Commercial Managed Care - HMO | Attending: Student | Admitting: Student

## 2023-02-13 ENCOUNTER — Other Ambulatory Visit: Payer: Self-pay

## 2023-02-13 ENCOUNTER — Emergency Department (HOSPITAL_COMMUNITY): Payer: Commercial Managed Care - HMO

## 2023-02-13 DIAGNOSIS — R1032 Left lower quadrant pain: Secondary | ICD-10-CM

## 2023-02-13 DIAGNOSIS — Z794 Long term (current) use of insulin: Secondary | ICD-10-CM | POA: Insufficient documentation

## 2023-02-13 DIAGNOSIS — N760 Acute vaginitis: Secondary | ICD-10-CM | POA: Diagnosis not present

## 2023-02-13 DIAGNOSIS — D72829 Elevated white blood cell count, unspecified: Secondary | ICD-10-CM | POA: Diagnosis not present

## 2023-02-13 DIAGNOSIS — B9689 Other specified bacterial agents as the cause of diseases classified elsewhere: Secondary | ICD-10-CM

## 2023-02-13 LAB — COMPREHENSIVE METABOLIC PANEL
ALT: 14 U/L (ref 0–44)
AST: 16 U/L (ref 15–41)
Albumin: 4 g/dL (ref 3.5–5.0)
Alkaline Phosphatase: 58 U/L (ref 38–126)
Anion gap: 7 (ref 5–15)
BUN: 8 mg/dL (ref 6–20)
CO2: 22 mmol/L (ref 22–32)
Calcium: 8.9 mg/dL (ref 8.9–10.3)
Chloride: 103 mmol/L (ref 98–111)
Creatinine, Ser: 0.75 mg/dL (ref 0.44–1.00)
GFR, Estimated: >60 mL/min (ref >60)
Glucose, Bld: 220 mg/dL — ABNORMAL HIGH (ref 70–99)
Potassium: 3.8 mmol/L (ref 3.5–5.1)
Sodium: 132 mmol/L — ABNORMAL LOW (ref 135–145)
Total Bilirubin: 0.6 mg/dL (ref 0.3–1.2)
Total Protein: 7.8 g/dL (ref 6.5–8.1)

## 2023-02-13 LAB — CBC
HCT: 31.8 % — ABNORMAL LOW (ref 36.0–46.0)
Hemoglobin: 10.2 g/dL — ABNORMAL LOW (ref 12.0–15.0)
MCH: 20 pg — ABNORMAL LOW (ref 26.0–34.0)
MCHC: 32.1 g/dL (ref 30.0–36.0)
MCV: 62.4 fL — ABNORMAL LOW (ref 80.0–100.0)
Platelets: 548 10*3/uL — ABNORMAL HIGH (ref 150–400)
RBC: 5.1 MIL/uL (ref 3.87–5.11)
RDW: 19 % — ABNORMAL HIGH (ref 11.5–15.5)
WBC: 13.1 10*3/uL — ABNORMAL HIGH (ref 4.0–10.5)
nRBC: 0 % (ref 0.0–0.2)

## 2023-02-13 LAB — URINALYSIS, ROUTINE W REFLEX MICROSCOPIC
Bilirubin Urine: NEGATIVE
Glucose, UA: NEGATIVE mg/dL
Hgb urine dipstick: NEGATIVE
Ketones, ur: NEGATIVE mg/dL
Leukocytes,Ua: NEGATIVE
Nitrite: NEGATIVE
Protein, ur: NEGATIVE mg/dL
Specific Gravity, Urine: 1.008 (ref 1.005–1.030)
pH: 7 (ref 5.0–8.0)

## 2023-02-13 LAB — WET PREP, GENITAL
Sperm: NONE SEEN
Trich, Wet Prep: NONE SEEN
WBC, Wet Prep HPF POC: >=10 — AB (ref <10)
Yeast Wet Prep HPF POC: NONE SEEN

## 2023-02-13 LAB — HIV ANTIBODY (ROUTINE TESTING W REFLEX): HIV Screen 4th Generation wRfx: NONREACTIVE

## 2023-02-13 LAB — HCG, SERUM, QUALITATIVE: Preg, Serum: NEGATIVE

## 2023-02-13 LAB — LIPASE, BLOOD: Lipase: 48 U/L (ref 11–51)

## 2023-02-13 MED ORDER — IOHEXOL 300 MG/ML  SOLN
100.0000 mL | Freq: Once | INTRAMUSCULAR | Status: AC | PRN
  Administered 2023-02-13: 100 mL via INTRAVENOUS

## 2023-02-13 MED ORDER — METRONIDAZOLE 500 MG PO TABS: 500.0000 mg | ORAL_TABLET | Freq: Two times a day (BID) | ORAL | 0 refills | Status: DC

## 2023-02-13 MED ORDER — ONDANSETRON HCL 4 MG PO TABS: 4.0000 mg | ORAL_TABLET | Freq: Three times a day (TID) | ORAL | 0 refills | Status: DC | PRN

## 2023-02-13 MED ORDER — ONDANSETRON HCL 4 MG/2ML IJ SOLN
4.0000 mg | Freq: Once | INTRAMUSCULAR | Status: AC | PRN
  Administered 2023-02-13: 4 mg via INTRAVENOUS
  Filled 2023-02-13: qty 2

## 2023-02-13 MED ORDER — SODIUM CHLORIDE 0.9 % IV BOLUS
1000.0000 mL | Freq: Once | INTRAVENOUS | Status: AC
  Administered 2023-02-13: 1000 mL via INTRAVENOUS

## 2023-02-13 NOTE — ED Triage Notes (Signed)
Pt c/o LLQ pain since 0900 this morning, relating pain so bad she broke into a sweat and was barely able to walk. Nausea x2 days.

## 2023-02-13 NOTE — ED Notes (Signed)
Pt teaching provided on medications that may cause drowsiness. Pt instructed not to drive or operate heavy machinery while taking the prescribed medication. Pt verbalized understanding.   

## 2023-02-13 NOTE — ED Notes (Signed)
Per Kong@ [facility]- requests 2nd gold top tube lab draw. Adam RN advised. [employer]

## 2023-02-13 NOTE — ED Provider Notes (Signed)
[place] [facility]ERGENCY DEPARTMENT AT [facility] Provider Note   CSN: [id number] Arrival date & time: 02/13/23  1220     History  Chief Complaint  Patient presents with   Abdominal Pain    [name] is a 47 y.o. female who presents emergency department with concerns for left lower quadrant abdominal pain onset 9 AM this morning.  Notes the pain has been constant.  Has associated nausea x 2 days, emesis.  No meds tried at home.  Denies diarrhea, constipation, vaginal bleeding, vaginal discharge, urinary symptoms.  Notes that she is sexually active with her last intercourse being 3 days ago and was unprotected.  Denies her partner telling her of any concerns for any STDs at this time.  The history is provided by the patient. No language interpreter was used.       Home Medications Prior to Admission medications   Medication Sig Start Date End Date Taking? Authorizing Provider  ondansetron (ZOFRAN) 4 MG tablet Take 1 tablet (4 mg total) by mouth every 8 (eight) hours as needed for nausea or vomiting. 02/13/23  Yes Fredericka Bottcher A, PA-C  albuterol (VENTOLIN HFA) 108 (90 Base) MCG/ACT inhaler Inhale 1-2 puffs into the lungs every 6 (six) hours as needed for wheezing or shortness of breath. 01/17/21   Rushie Chestnut, PA-C  blood glucose meter kit and supplies KIT Dispense based on patient and insurance preference. Use up to four times daily as directed. 02/22/21   Rushie Chestnut, PA-C  doxycycline (VIBRAMYCIN) 100 MG capsule Take 1 capsule (100 mg total) by mouth 2 (two) times daily. 10/15/20   Elson Areas, PA-C  hydrOXYzine (VISTARIL) 25 MG capsule Take 1 capsule (25 mg total) by mouth 3 (three) times daily as needed. 10/15/20   Elson Areas, PA-C  Insulin Pen Needle (PEN NEEDLES) 31G X 8 MM MISC 1 each by Does not apply route 3 (three) times daily. 01/17/21   Rushie Chestnut, PA-C  Lancets [facility] ULTRASOFT) lancets Use as instructed. 06/18/14   Charlane Ferretti, MD  LEVEMIR FLEXTOUCH 100 UNIT/ML Pen Inject 14 Units into the skin daily. 11/16/18   [provider]  Lurasidone HCl (LATUDA) 60 MG TABS Take 60 mg by mouth.    [provider]  metroNIDAZOLE (FLAGYL) 500 MG tablet Take 1 tablet (500 mg total) by mouth 2 (two) times daily. 02/13/23   Amritpal Shropshire A, PA-C  naproxen sodium (ALEVE) 220 MG tablet Take 440-660 mg by mouth 2 (two) times daily as needed (headache/pain).    [provider]  predniSONE (STERAPRED UNI-PAK 21 TAB) 10 MG (21) TBPK tablet Take by mouth daily. Take 6 tabs by mouth daily  for 2 days, then 5 tabs for 2 days, then 4 tabs for 2 days, then 3 tabs for 2 days, 2 tabs for 2 days, then 1 tab by mouth daily for 2 days 01/17/21   Rushie Chestnut, PA-C  glipiZIDE (GLUCOTROL) 10 MG tablet Take 1 tablet (10 mg total) by mouth 2 (two) times daily before a meal. Patient not taking: Reported on 02/22/2019 07/26/17 12/23/19  Derwood Kaplan, MD  lamoTRIgine (LAMICTAL) 200 MG tablet Take 1 tablet (200 mg total) by mouth daily. Patient not taking: Reported on 02/22/2019 08/16/15 12/23/19  Oneta Rack, NP  metFORMIN (GLUCOPHAGE) 500 MG tablet Take 1 tablet (500 mg total) by mouth 2 (two) times daily with a meal. Patient not taking: Reported on 02/22/2019 07/26/17 12/23/19  Rhunette Croft,  Ankit, MD  risperiDONE (RISPERDAL M-TABS) 3 MG disintegrating tablet Take 1 tablet (3 mg total) by mouth at bedtime. Patient not taking: Reported on 10/25/2015 08/16/15 12/23/19  Oneta Rack, NP      Allergies    Patient has no known allergies.    Review of Systems   Review of Systems  Gastrointestinal:  Positive for abdominal pain.  All other systems reviewed and are negative.   Physical Exam Updated Vital Signs BP (!) 158/82 (BP Location: Left Arm)   Pulse 80   Temp 98.1 F (36.7 C) (Oral)   Resp 16   SpO2 100%  Physical Exam Vitals and nursing note reviewed. Exam conducted with a chaperone present.  Constitutional:       General: She is not in acute distress.    Appearance: Normal appearance. She is not ill-appearing, toxic-appearing or diaphoretic.  HENT:     Head: Normocephalic and atraumatic.     Right Ear: External ear normal.     Left Ear: External ear normal.     Mouth/Throat:     Pharynx: No oropharyngeal exudate.  Eyes:     General: No scleral icterus.    Extraocular Movements: Extraocular movements intact.     Conjunctiva/sclera: Conjunctivae normal.  Cardiovascular:     Rate and Rhythm: Normal rate and regular rhythm.     Pulses: Normal pulses.     Heart sounds: Normal heart sounds.  Pulmonary:     Effort: Pulmonary effort is normal. No respiratory distress.     Breath sounds: Normal breath sounds. No wheezing.  Abdominal:     General: Abdomen is flat. Bowel sounds are normal. There is no distension.     Palpations: Abdomen is soft. There is no mass.     Tenderness: There is generalized abdominal tenderness and tenderness in the left lower quadrant. There is left CVA tenderness. There is no guarding or rebound.     Hernia: There is no hernia in the left inguinal area or right inguinal area.     Comments: Mild tenderness to palpation noted diffusely to abdomen, more so to left lower quadrant region.  Positive left CVA tenderness to palpation.  Genitourinary:    Pubic Area: No rash.      Labia:        Right: No rash, tenderness, lesion or injury.        Left: No rash, tenderness, lesion or injury.      Vagina: No signs of injury and foreign body. Vaginal discharge present. No erythema, tenderness or bleeding.     Cervix: Normal.     Uterus: Normal. Not deviated, not enlarged, not fixed and not tender.      Adnexa: Right adnexa normal and left adnexa normal.     Comments: NT chaperone Doretha Sou) present for exam. Mild vaginal spotting noted on exam. No adnexal TTP noted bilaterally.  Musculoskeletal:        General: Normal range of motion.     Cervical back: Normal range of motion  and neck supple.  Lymphadenopathy:     Lower Body: No right inguinal adenopathy. No left inguinal adenopathy.  Skin:    General: Skin is warm and dry.  Neurological:     Mental Status: She is alert.  Psychiatric:        Behavior: Behavior normal.    ED Results / Procedures / Treatments   Labs (all labs ordered are listed, but only abnormal results are displayed) Labs Reviewed  WET PREP,  GENITAL - Abnormal; Notable for the following components:      Result Value   Clue Cells Wet Prep HPF POC PRESENT (*)    WBC, Wet Prep HPF POC >=10 (*)    All other components within normal limits  COMPREHENSIVE METABOLIC PANEL - Abnormal; Notable for the following components:   Sodium 132 (*)    Glucose, Bld 220 (*)    All other components within normal limits  CBC - Abnormal; Notable for the following components:   WBC 13.1 (*)    Hemoglobin 10.2 (*)    HCT 31.8 (*)    MCV 62.4 (*)    MCH 20.0 (*)    RDW 19.0 (*)    Platelets 548 (*)    All other components within normal limits  URINALYSIS, ROUTINE W REFLEX MICROSCOPIC - Abnormal; Notable for the following components:   Color, Urine STRAW (*)    All other components within normal limits  LIPASE, BLOOD  HCG, SERUM, QUALITATIVE  HIV ANTIBODY (ROUTINE TESTING W REFLEX)  RPR  GC/CHLAMYDIA PROBE AMP ([place]) NOT AT [facility]    EKG None  Radiology US Pelvis Complete  Result Date: 02/13/2023 CLINICAL DATA:  Left lower quadrant pain EXAM: TRANSABDOMINAL AND TRANSVAGINAL ULTRASOUND OF PELVIS DOPPLER ULTRASOUND OF OVARIES TECHNIQUE: Both transabdominal and transvaginal ultrasound examinations of the pelvis were performed. Transabdominal technique was performed for global imaging of the pelvis including uterus, ovaries, adnexal regions, and pelvic cul-de-sac. It was necessary to proceed with endovaginal exam following the transabdominal exam to visualize the uterus endometrium ovaries. Color and duplex Doppler ultrasound was utilized to  evaluate blood flow to the ovaries. COMPARISON:  CT 02/13/2023 FINDINGS: Uterus Measurements: 12.1 x 7.5 x 7.1 cm = volume: 342.4 mL. Left subserosal uterine fundal fibroid measuring 2.6 x 3.1 x 3 cm. Central uterine mass measuring 4.2 x 5.9 x 3.6 cm. Endometrium Unable to measure due to central uterine mass. Right ovary Measurements: 4.1 by 5.6 by 3.5 cm, transabdominal = volume: 42 mL. Normal appearance/no adnexal mass. Left ovary Measurements: 3.8 x 2.1 x 2.9 cm = volume: 12.3 mL. Normal appearance/no adnexal mass. Pulsed Doppler evaluation of both ovaries demonstrates normal low-resistance arterial and venous waveforms. Other findings No abnormal free fluid. IMPRESSION: 1. Negative for ovarian torsion. Prominent right ovary but without mass. 2. 3.1 cm left subserosal uterine fundal fibroid. 3. 5.9 cm central uterine mass, either representing submucosal fibroid or endometrial mass. A focal endometrial lesion is suspected. Consider sonohysterogram for further evaluation, prior to hysteroscopy or endometrial biopsy. Electronically Signed   By: Jasmine Pang M.D.   On: 02/13/2023 17:03   US Transvaginal Non-OB  Result Date: 02/13/2023 CLINICAL DATA:  Left lower quadrant pain EXAM: TRANSABDOMINAL AND TRANSVAGINAL ULTRASOUND OF PELVIS DOPPLER ULTRASOUND OF OVARIES TECHNIQUE: Both transabdominal and transvaginal ultrasound examinations of the pelvis were performed. Transabdominal technique was performed for global imaging of the pelvis including uterus, ovaries, adnexal regions, and pelvic cul-de-sac. It was necessary to proceed with endovaginal exam following the transabdominal exam to visualize the uterus endometrium ovaries. Color and duplex Doppler ultrasound was utilized to evaluate blood flow to the ovaries. COMPARISON:  CT 02/13/2023 FINDINGS: Uterus Measurements: 12.1 x 7.5 x 7.1 cm = volume: 342.4 mL. Left subserosal uterine fundal fibroid measuring 2.6 x 3.1 x 3 cm. Central uterine mass measuring 4.2 x  5.9 x 3.6 cm. Endometrium Unable to measure due to central uterine mass. Right ovary Measurements: 4.1 by 5.6 by 3.5 cm, transabdominal = volume: 42  mL. Normal appearance/no adnexal mass. Left ovary Measurements: 3.8 x 2.1 x 2.9 cm = volume: 12.3 mL. Normal appearance/no adnexal mass. Pulsed Doppler evaluation of both ovaries demonstrates normal low-resistance arterial and venous waveforms. Other findings No abnormal free fluid. IMPRESSION: 1. Negative for ovarian torsion. Prominent right ovary but without mass. 2. 3.1 cm left subserosal uterine fundal fibroid. 3. 5.9 cm central uterine mass, either representing submucosal fibroid or endometrial mass. A focal endometrial lesion is suspected. Consider sonohysterogram for further evaluation, prior to hysteroscopy or endometrial biopsy. Electronically Signed   By: Jasmine Pang M.D.   On: 02/13/2023 17:03   [place] Art/Ven Flow Abd Pelv Doppler  Result Date: 02/13/2023 CLINICAL DATA:  Left lower quadrant pain EXAM: TRANSABDOMINAL AND TRANSVAGINAL ULTRASOUND OF PELVIS DOPPLER ULTRASOUND OF OVARIES TECHNIQUE: Both transabdominal and transvaginal ultrasound examinations of the pelvis were performed. Transabdominal technique was performed for global imaging of the pelvis including uterus, ovaries, adnexal regions, and pelvic cul-de-sac. It was necessary to proceed with endovaginal exam following the transabdominal exam to visualize the uterus endometrium ovaries. Color and duplex Doppler ultrasound was utilized to evaluate blood flow to the ovaries. COMPARISON:  CT 02/13/2023 FINDINGS: Uterus Measurements: 12.1 x 7.5 x 7.1 cm = volume: 342.4 mL. Left subserosal uterine fundal fibroid measuring 2.6 x 3.1 x 3 cm. Central uterine mass measuring 4.2 x 5.9 x 3.6 cm. Endometrium Unable to measure due to central uterine mass. Right ovary Measurements: 4.1 by 5.6 by 3.5 cm, transabdominal = volume: 42 mL. Normal appearance/no adnexal mass. Left ovary Measurements: 3.8 x 2.1 x 2.9  cm = volume: 12.3 mL. Normal appearance/no adnexal mass. Pulsed Doppler evaluation of both ovaries demonstrates normal low-resistance arterial and venous waveforms. Other findings No abnormal free fluid. IMPRESSION: 1. Negative for ovarian torsion. Prominent right ovary but without mass. 2. 3.1 cm left subserosal uterine fundal fibroid. 3. 5.9 cm central uterine mass, either representing submucosal fibroid or endometrial mass. A focal endometrial lesion is suspected. Consider sonohysterogram for further evaluation, prior to hysteroscopy or endometrial biopsy. Electronically Signed   By: Jasmine Pang M.D.   On: 02/13/2023 17:03   CT ABDOMEN PELVIS W CONTRAST  Result Date: 02/13/2023 CLINICAL DATA:  Left lower quadrant abdominal pain EXAM: CT ABDOMEN AND PELVIS WITH CONTRAST TECHNIQUE: Multidetector CT imaging of the abdomen and pelvis was performed using the standard protocol following bolus administration of intravenous contrast. RADIATION DOSE REDUCTION: This exam was performed according to the departmental dose-optimization program which includes automated exposure control, adjustment of the mA and/or kV according to patient size and/or use of iterative reconstruction technique. CONTRAST:  OMNIPAQUE IOHEXOL 300 MG/ML  SOLN COMPARISON:  Pelvic ultrasound 10/08/2010 FINDINGS: Lower chest: No acute abnormality. Hepatobiliary: Mildly decreased attenuation of the hepatic parenchyma. No focal liver lesion is identified. Gallbladder is partially contracted but appears. No hyperdense gallstone. No biliary dilatation. Pancreas: Unremarkable. No pancreatic ductal dilatation or surrounding inflammatory changes. Spleen: Normal in size without focal abnormality. Adrenals/Urinary Tract: Unremarkable adrenal glands. Kidneys enhance symmetrically without focal lesion, stone, or hydronephrosis. Ureters are nondilated. Urinary bladder wall appears mildly thickened, which may be accentuated by under-distension.  Stomach/Bowel: Stomach is within normal limits. Appendix appears normal. No evidence of bowel wall thickening, distention, or inflammatory changes. Vascular/Lymphatic: Scattered aortoiliac atherosclerotic calcifications without aneurysm. No abdominopelvic lymphadenopathy. Reproductive: Anteverted uterus with abnormally thickened endometrial complex measuring approximately 5.5 cm in diameter. No adnexal masses. Other: No free fluid. No abdominopelvic fluid collection. No pneumoperitoneum. No abdominal wall hernia. Musculoskeletal: No  acute or significant osseous findings. IMPRESSION: 1. Abnormally thickened endometrial complex measuring approximately 5.5 cm in diameter. Further evaluation with pelvic ultrasound is recommended. 2. Urinary bladder wall appears mildly thickened, which may be accentuated by under-distension. Correlate with urinalysis to exclude cystitis. 3. Mild hepatic steatosis. 4. Aortic atherosclerosis (ICD10-I70.0). Electronically Signed   By: Duanne Guess D.O.   On: 02/13/2023 15:12    Procedures Procedures    Medications Ordered in ED Medications  ondansetron (ZOFRAN) injection 4 mg (4 mg Intravenous Given 02/13/23 1305)  sodium chloride 0.9 % bolus 1,000 mL (0 mLs Intravenous Stopped 02/13/23 1440)  iohexol (OMNIPAQUE) 300 MG/ML solution 100 mL (100 mLs Intravenous Contrast Given 02/13/23 1424)    ED Course/ Medical Decision Making/ A&P Clinical Course as of 02/13/23 1916  Sun Feb 13, 2023  1542 Discussed with patient lab and imaging findings. Answered all available questions.  [SB]  1616 Clue Cells Wet Prep HPF POC(!): PRESENT [SB]    Clinical Course User Index [SB] Aliana Kreischer A, PA-C                             Medical Decision Making Amount and/or Complexity of Data Reviewed Labs: ordered. Decision-making details documented in ED Course. Radiology: ordered.  Risk Prescription drug management.   Patient presents to the emergency department with concerns for  left lower quadrant abdominal pain onset 9 AM this morning.  Patient afebrile.  On exam patient with mild tenderness to palpation noted diffusely to abdomen, more so to left lower quadrant.  Positive left CVA tenderness to palpation.  Nurse tech chaperone present for GU exam.  Mild vaginal spotting noted on exam, patient reports getting off of her menstrual cycle).  No adnexal tenderness to palpation noted bilaterally.  Remainder of exam without acute findings.  Differential diagnosis includes diverticulitis, appendicitis, acute cystitis, pyelonephritis, nephrolithiasis.  Labs:  I ordered, and personally interpreted labs.  The pertinent results include:  Wet prep notable positive for clue cells HIV, RPR, GC/chlamydia ordered results pending at time of discharge Negative hCG Lipase unremarkable CMP with slightly decreased sodium at 132, glucose at 220 otherwise unremarkable CBC with leukocytosis at 13.1, hemoglobin at 10.2 otherwise unremarkable Urinalysis unremarkable  Imaging: I ordered imaging studies including CT abdomen pelvis, pelvic ultrasound with Doppler CT I independently visualized and interpreted imaging which showed the abdomen pelvis with  1. Abnormally thickened endometrial complex measuring approximately  5.5 cm in diameter. Further evaluation with pelvic ultrasound is  recommended.  2. Urinary bladder wall appears mildly thickened, which may be  accentuated by under-distension. Correlate with urinalysis to  exclude cystitis.  3. Mild hepatic steatosis.  4. Aortic atherosclerosis (ICD10-I70.0).   [place] with  1. Negative for ovarian torsion. Prominent right ovary but without  mass.  2. 3.1 cm left subserosal uterine fundal fibroid.  3. 5.9 cm central uterine mass, either representing submucosal  fibroid or endometrial mass. A focal endometrial lesion is  suspected. Consider sonohysterogram for further evaluation, prior to  hysteroscopy or endometrial biopsy.    I agree  with the radiologist interpretation  Medications:  I ordered medication including IVF and Zofran for symptom management. Reevaluation of the patient after these medicines and interventions, I reevaluated the patient and found that they have improved I have reviewed the patients home medicines and have made adjustments as needed Tolerated PO challenge in ED with above treatment regimen   Disposition: Presenting suspicious for left lower quadrant  abdominal pain.  Also notable for bacterial vaginosis.  Ultrasound findings with results of uterine fibroids. After consideration of the diagnostic results and the patients response to treatment, I feel that the patient would benefit from Discharge home.  Patient discharged home with prescription for Zofran and Flagyl.  Patient notes that she would like to wait on empirical treatment for gonorrhea and chlamydia until the results return.  Patient provided with information for OB/GYN specialist for follow-up regarding today's ED visit.  Work note provided. Supportive care measures and strict return precautions discussed with patient at bedside. Pt acknowledges and verbalizes understanding. Pt appears safe for discharge. Follow up as indicated in discharge paperwork.   This chart was dictated using voice recognition software, Dragon. Despite the best efforts of this provider to proofread and correct errors, errors may still occur which can change documentation meaning.   Final Clinical Impression(s) / ED Diagnoses Final diagnoses:  Left lower quadrant abdominal pain  BV (bacterial vaginosis)    Rx / DC Orders ED Discharge Orders          Ordered    ondansetron (ZOFRAN) 4 MG tablet  Every 8 hours PRN        02/13/23 1730    metroNIDAZOLE (FLAGYL) 500 MG tablet  2 times daily        02/13/23 1731              Allisson Schindel A, PA-C 02/13/23 1951    Kommor, Wyn Forster, MD 02/14/23 1106

## 2023-02-13 NOTE — Discharge Instructions (Addendum)
It was a pleasure taking care of you today!  Your lab and imaging studies did not show any concerning emergent findings at this time.  Your ultrasound showed concerns for fibroids and an area of concern for growth to your endometrial lining. This can be managed with an OBGYN consult. Attached is information for the OBGYN, follow up regarding todays ED visit. You will be sent a prescription for Zofran, take as directed if you are experiencing nausea/vomiting. You have pending lab results for STI work-up.  You may see the results of your labs on MyChart.  You will be prescribed Flagyl, ensure to complete the entire course of antibiotics prescribed. Ensure to practice safe sex with condom use. Return to the Emergency Department if you are experiencing fever, increasing/worsening symptoms. If you have to take Zofran, wait at least 30 minutes before you attempt small sips/small bites of food/fluid.  Ensure to maintain fluid intake with water, tea, broth, soup, Pedialyte, Gatorade.  Follow-up with your primary care provider as needed regarding this ED visit.  Attached is information for [facility] to establish care with a primary care provider. You may also [facility] for follow up. Attached is information for the on-call GI specialist, you may call and set up a follow-up appointment regarding today's ED visit.  Return to the emergency department if you experience increasing/worsening symptoms.

## 2023-02-14 LAB — GC/CHLAMYDIA PROBE AMP (~~LOC~~) NOT AT ARMC
Chlamydia: NEGATIVE
Comment: NEGATIVE
Comment: NORMAL
Neisseria Gonorrhea: NEGATIVE

## 2023-02-14 LAB — RPR: RPR Ser Ql: NONREACTIVE

## 2023-02-21 ENCOUNTER — Ambulatory Visit: Payer: Commercial Managed Care - HMO | Admitting: Internal Medicine

## 2023-02-25 ENCOUNTER — Encounter: Payer: Self-pay | Admitting: Internal Medicine

## 2023-02-25 ENCOUNTER — Ambulatory Visit (INDEPENDENT_AMBULATORY_CARE_PROVIDER_SITE_OTHER): Payer: Commercial Managed Care - HMO | Admitting: Internal Medicine

## 2023-02-25 VITALS — BP 120/84 | HR 59 | Temp 98.5°F | Ht 63.0 in | Wt 162.0 lb

## 2023-02-25 DIAGNOSIS — Z794 Long term (current) use of insulin: Secondary | ICD-10-CM

## 2023-02-25 DIAGNOSIS — E1169 Type 2 diabetes mellitus with other specified complication: Secondary | ICD-10-CM

## 2023-02-25 DIAGNOSIS — Z124 Encounter for screening for malignant neoplasm of cervix: Secondary | ICD-10-CM

## 2023-02-25 DIAGNOSIS — Z1211 Encounter for screening for malignant neoplasm of colon: Secondary | ICD-10-CM

## 2023-02-25 DIAGNOSIS — E119 Type 2 diabetes mellitus without complications: Secondary | ICD-10-CM | POA: Insufficient documentation

## 2023-02-25 DIAGNOSIS — F3163 Bipolar disorder, current episode mixed, severe, without psychotic features: Secondary | ICD-10-CM

## 2023-02-25 DIAGNOSIS — F319 Bipolar disorder, unspecified: Secondary | ICD-10-CM | POA: Insufficient documentation

## 2023-02-25 DIAGNOSIS — Z1231 Encounter for screening mammogram for malignant neoplasm of breast: Secondary | ICD-10-CM | POA: Diagnosis not present

## 2023-02-25 LAB — COMPREHENSIVE METABOLIC PANEL
ALT: 14 U/L (ref 0–35)
AST: 15 U/L (ref 0–37)
Albumin: 4.1 g/dL (ref 3.5–5.2)
Alkaline Phosphatase: 52 U/L (ref 39–117)
BUN: 12 mg/dL (ref 6–23)
CO2: 23 mEq/L (ref 19–32)
Calcium: 9 mg/dL (ref 8.4–10.5)
Chloride: 104 mEq/L (ref 96–112)
Creatinine, Ser: 0.72 mg/dL (ref 0.40–1.20)
GFR: 99.76 mL/min (ref >60.00)
Glucose, Bld: 194 mg/dL — ABNORMAL HIGH (ref 70–99)
Potassium: 4.3 mEq/L (ref 3.5–5.1)
Sodium: 133 mEq/L — ABNORMAL LOW (ref 135–145)
Total Bilirubin: 0.4 mg/dL (ref 0.2–1.2)
Total Protein: 7 g/dL (ref 6.0–8.3)

## 2023-02-25 LAB — CBC WITH DIFFERENTIAL/PLATELET
Basophils Absolute: 0.1 10*3/uL (ref 0.0–0.1)
Basophils Relative: 1.2 % (ref 0.0–3.0)
Eosinophils Absolute: 0.2 10*3/uL (ref 0.0–0.7)
Eosinophils Relative: 2.2 % (ref 0.0–5.0)
HCT: 32 % — ABNORMAL LOW (ref 36.0–46.0)
Hemoglobin: 10.4 g/dL — ABNORMAL LOW (ref 12.0–15.0)
Lymphocytes Relative: 30.6 % (ref 12.0–46.0)
Lymphs Abs: 2.6 10*3/uL (ref 0.7–4.0)
MCHC: 32.6 g/dL (ref 30.0–36.0)
MCV: 63.3 fl — ABNORMAL LOW (ref 78.0–100.0)
Monocytes Absolute: 0.7 10*3/uL (ref 0.1–1.0)
Monocytes Relative: 8.4 % (ref 3.0–12.0)
Neutro Abs: 4.9 10*3/uL (ref 1.4–7.7)
Neutrophils Relative %: 57.6 % (ref 43.0–77.0)
Platelets: 540 10*3/uL — ABNORMAL HIGH (ref 150.0–400.0)
RBC: 5.06 Mil/uL (ref 3.87–5.11)
RDW: 20.3 % — ABNORMAL HIGH (ref 11.5–15.5)
WBC: 8.4 10*3/uL (ref 4.0–10.5)

## 2023-02-25 LAB — MICROALBUMIN / CREATININE URINE RATIO
Creatinine,U: 122.4 mg/dL
Microalb Creat Ratio: 8.5 mg/g (ref 0.0–30.0)
Microalb, Ur: 10.4 mg/dL — ABNORMAL HIGH (ref 0.0–1.9)

## 2023-02-25 LAB — LIPID PANEL
Cholesterol: 218 mg/dL — ABNORMAL HIGH (ref 0–200)
HDL: 59.4 mg/dL (ref >39.00)
LDL Cholesterol: 143 mg/dL — ABNORMAL HIGH (ref 0–99)
NonHDL: 158.44
Total CHOL/HDL Ratio: 4
Triglycerides: 75 mg/dL (ref 0.0–149.0)
VLDL: 15 mg/dL (ref 0.0–40.0)

## 2023-02-25 LAB — POCT GLYCOSYLATED HEMOGLOBIN (HGB A1C): Hemoglobin A1C: 8.9 % — AB (ref 4.0–5.6)

## 2023-02-25 MED ORDER — ONDANSETRON HCL 4 MG PO TABS
4.0000 mg | ORAL_TABLET | Freq: Three times a day (TID) | ORAL | 0 refills | Status: DC | PRN
Start: 2023-02-25 — End: 2023-04-12

## 2023-02-25 MED ORDER — OZEMPIC (0.25 OR 0.5 MG/DOSE) 2 MG/3ML ~~LOC~~ SOPN: 0.5000 mg | PEN_INJECTOR | SUBCUTANEOUS | 2 refills | Status: DC

## 2023-02-25 NOTE — Assessment & Plan Note (Signed)
Needs urgent referral to psychiatry, will place.

## 2023-02-25 NOTE — Progress Notes (Signed)
New Patient Office Visit     CC/Reason for Visit: Establish care, discuss chronic concerns Previous PCP: None Last Visit: Unknown  HPI: [name] is a 47 y.o. female who is coming in today for the above mentioned reasons. Past Medical History is significant for: Type 2 diabetes and bipolar disorder that tends towards mania.  She has 3 children, works at [employer].  She smokes 1/2 pack a day and has done so for 20 years, she drinks 3 shots of moonshine on average every day, no allergies, surgical history is only significant for tubal ligation.  Family history significant for diabetes and hypertension in father and mother with hypertension and schizophrenia.  She is overdue for all age-appropriate cancer screening, diabetic eye exam and immunizations.  She declines immunizations today.   Past Medical/Surgical History: Past Medical History:  Diagnosis Date   Bipolar 1 disorder (HCC)    Bronchitis    Depressive disorder, not elsewhere classified    DM (diabetes mellitus), type 2 (HCC)    Gestational diabetes    Hidradenitis suppurativa    Obesity    Tobacco abuse     Past Surgical History:  Procedure Laterality Date   TUBAL LIGATION      Social History:  reports that she has been smoking cigarettes. She has never used smokeless tobacco. She reports current alcohol use of about 2.0 standard drinks of alcohol per week. She reports current drug use. Drug: Marijuana.  Allergies: No Known Allergies  Family History:  Family History  Problem Relation Age of Onset   Hypertension Mother    Schizophrenia Mother    Hypertension Father    Diabetes Father      Current Outpatient Medications:    albuterol (VENTOLIN HFA) 108 (90 Base) MCG/ACT inhaler, Inhale 1-2 puffs into the lungs every 6 (six) hours as needed for wheezing or shortness of breath., Disp: 1 each, Rfl: 0   blood glucose meter kit and supplies KIT, Dispense based on patient and insurance preference. Use up to  four times daily as directed., Disp: 1 each, Rfl: 0   doxycycline (VIBRAMYCIN) 100 MG capsule, Take 1 capsule (100 mg total) by mouth 2 (two) times daily., Disp: 20 capsule, Rfl: 0   hydrOXYzine (VISTARIL) 25 MG capsule, Take 1 capsule (25 mg total) by mouth 3 (three) times daily as needed., Disp: 30 capsule, Rfl: 0   Insulin Pen Needle (PEN NEEDLES) 31G X 8 MM MISC, 1 each by Does not apply route 3 (three) times daily., Disp: 90 each, Rfl: 0   Lancets (ONETOUCH ULTRASOFT) lancets, Use as instructed., Disp: 100 each, Rfl: 12   LEVEMIR FLEXTOUCH 100 UNIT/ML Pen, Inject 14 Units into the skin daily., Disp: , Rfl:    Lurasidone HCl (LATUDA) 60 MG TABS, Take 60 mg by mouth., Disp: , Rfl:    naproxen sodium (ALEVE) 220 MG tablet, Take 440-660 mg by mouth 2 (two) times daily as needed (headache/pain)., Disp: , Rfl:    Semaglutide,0.25 or 0.5MG /DOS, (OZEMPIC, 0.25 OR 0.5 MG/DOSE,) 2 MG/3ML SOPN, Inject 0.5 mg into the skin once a week., Disp: 3 mL, Rfl: 2   ondansetron (ZOFRAN) 4 MG tablet, Take 1 tablet (4 mg total) by mouth every 8 (eight) hours as needed for nausea or vomiting., Disp: 12 tablet, Rfl: 0  Review of Systems:  Negative except as indicated in HPI.   Physical Exam: Vitals:   02/25/23 0903  BP: 120/84  Pulse: (!) 59  Temp: 98.5 F (36.9 C)  TempSrc: Oral  SpO2: 98%  Weight: 162 lb (73.5 kg)  Height: 5\' 3"  (1.6 m)   Body mass index is 28.7 kg/m.  Physical Exam Vitals reviewed.  Constitutional:      Appearance: Normal appearance.  HENT:     Head: Normocephalic and atraumatic.  Eyes:     Conjunctiva/sclera: Conjunctivae normal.     Pupils: Pupils are equal, round, and reactive to light.  Cardiovascular:     Rate and Rhythm: Normal rate and regular rhythm.  Pulmonary:     Effort: Pulmonary effort is normal.     Breath sounds: Normal breath sounds.  Skin:    General: Skin is warm and dry.  Neurological:     General: No focal deficit present.     Mental Status: She is  alert and oriented to person, place, and time.  Psychiatric:        Mood and Affect: Mood normal.        Behavior: Behavior normal.        Thought Content: Thought content normal.        Judgment: Judgment normal.       Impression and Plan:  Type 2 diabetes mellitus with other specified complication, with long-term current use of insulin (HCC) Assessment & Plan: A1c demonstrates uncontrolled diabetes at 8.9.  Start Ozempic 0.5 mg weekly.  Okay to hold off insulin for now, check microalbumin, sent for diabetic eye exam.  Requesting referral to endocrinology, will place.  Check labs today including lipids and kidney function.  Orders: -     Ondansetron HCl; Take 1 tablet (4 mg total) by mouth every 8 (eight) hours as needed for nausea or vomiting.  Dispense: 12 tablet; Refill: 0 -     POCT glycosylated hemoglobin (Hb A1C) -     Ambulatory referral to Ophthalmology -     Ambulatory referral to Endocrinology -     Microalbumin / creatinine urine ratio; Future -     CBC with Differential/Platelet; Future -     Comprehensive metabolic panel; Future -     Lipid panel; Future -     Ozempic (0.25 or 0.5 MG/DOSE); Inject 0.5 mg into the skin once a week.  Dispense: 3 mL; Refill: 2  Screening for cervical cancer -     Ambulatory referral to Obstetrics / Gynecology  Screening for malignant neoplasm of colon -     Ambulatory referral to Gastroenterology  Screening mammogram for breast cancer -     Digital Screening Mammogram, Left and Right; Future  Bipolar disorder, curr episode mixed, severe, w/o psychotic features [facility]) Assessment & Plan: Needs urgent referral to psychiatry, will place.  Orders: -     Ambulatory referral to Psychiatry     Time spent: 46 minutes reviewing chart, interviewing and examining patient and formulating plan of care.       Chaya Jan, MD [facility]

## 2023-02-25 NOTE — Assessment & Plan Note (Signed)
A1c demonstrates uncontrolled diabetes at 8.9.  Start Ozempic 0.5 mg weekly.  Okay to hold off insulin for now, check microalbumin, sent for diabetic eye exam.  Requesting referral to endocrinology, will place.  Check labs today including lipids and kidney function.

## 2023-03-02 ENCOUNTER — Ambulatory Visit
Admission: RE | Admit: 2023-03-02 | Discharge: 2023-03-02 | Disposition: A | Discharge status: Home / Self Care | Payer: Commercial Managed Care - HMO | Source: Ambulatory Visit | Attending: Internal Medicine | Admitting: Internal Medicine

## 2023-03-02 ENCOUNTER — Other Ambulatory Visit (HOSPITAL_COMMUNITY): Payer: Self-pay

## 2023-03-02 ENCOUNTER — Telehealth: Payer: Self-pay | Admitting: Internal Medicine

## 2023-03-02 ENCOUNTER — Ambulatory Visit: Payer: Commercial Managed Care - HMO

## 2023-03-02 ENCOUNTER — Telehealth: Payer: Self-pay

## 2023-03-02 DIAGNOSIS — Z1231 Encounter for screening mammogram for malignant neoplasm of breast: Secondary | ICD-10-CM

## 2023-03-02 NOTE — Telephone Encounter (Signed)
Pt states Semaglutide,0.25 or 0.5MG /DOS, (OZEMPIC, 0.25 OR 0.5 MG/DOSE,) 2 MG/3ML SOPN needs a PA

## 2023-03-02 NOTE — Telephone Encounter (Addendum)
Pharmacy Patient Advocate Encounter  Received notification from [facility] that Prior Authorization for Ozempic (0.25 or 0.5 MG/DOSE) 2MG /3ML pen-injectors has been APPROVED from 03/02/23 to 03/01/24. Ran test claim, Copay is $4  PA #/Case ID/Reference #: [id number]

## 2023-03-02 NOTE — Telephone Encounter (Signed)
PA request has been Submitted. New Encounter created for follow up. For additional info see Pharmacy Prior Auth telephone encounter from 03/02/23.

## 2023-03-02 NOTE — Telephone Encounter (Signed)
Submitted PA clinical questions

## 2023-03-02 NOTE — Telephone Encounter (Signed)
Pharmacy Patient Advocate Encounter   Received notification from CoverMyMeds that prior authorization for Ozempc 2mg /68ml is required/requested.   Insurance verification completed.   The patient is insured through [place] .   Per test claim: PA required; PA started via CoverMyMeds. KEY [id number] . Waiting for clinical questions to populate.

## 2023-03-03 ENCOUNTER — Other Ambulatory Visit: Payer: Self-pay | Admitting: Internal Medicine

## 2023-03-03 ENCOUNTER — Encounter: Payer: Self-pay | Admitting: Internal Medicine

## 2023-03-03 DIAGNOSIS — D509 Iron deficiency anemia, unspecified: Secondary | ICD-10-CM

## 2023-03-03 DIAGNOSIS — R809 Proteinuria, unspecified: Secondary | ICD-10-CM

## 2023-03-03 DIAGNOSIS — E785 Hyperlipidemia, unspecified: Secondary | ICD-10-CM

## 2023-03-03 MED ORDER — LISINOPRIL 10 MG PO TABS: 10.0000 mg | ORAL_TABLET | Freq: Every day | ORAL | 1 refills | Status: DC

## 2023-03-03 MED ORDER — ATORVASTATIN CALCIUM 40 MG PO TABS: 40.0000 mg | ORAL_TABLET | Freq: Every day | ORAL | 1 refills | Status: DC

## 2023-03-03 NOTE — Progress Notes (Signed)
Called 03/03/23

## 2023-03-07 ENCOUNTER — Other Ambulatory Visit: Payer: Self-pay | Admitting: Internal Medicine

## 2023-03-07 ENCOUNTER — Other Ambulatory Visit: Payer: Self-pay

## 2023-03-07 DIAGNOSIS — D509 Iron deficiency anemia, unspecified: Secondary | ICD-10-CM

## 2023-03-07 DIAGNOSIS — R928 Other abnormal and inconclusive findings on diagnostic imaging of breast: Secondary | ICD-10-CM

## 2023-03-08 ENCOUNTER — Other Ambulatory Visit (INDEPENDENT_AMBULATORY_CARE_PROVIDER_SITE_OTHER): Payer: Commercial Managed Care - HMO

## 2023-03-08 ENCOUNTER — Other Ambulatory Visit: Payer: Self-pay

## 2023-03-08 DIAGNOSIS — D509 Iron deficiency anemia, unspecified: Secondary | ICD-10-CM | POA: Diagnosis not present

## 2023-03-08 LAB — IBC + FERRITIN
Ferritin: 5.6 ng/mL — ABNORMAL LOW (ref 10.0–291.0)
Iron: 36 ug/dL — ABNORMAL LOW (ref 42–145)
Saturation Ratios: 8.3 % — ABNORMAL LOW (ref 20.0–50.0)
TIBC: 432.6 ug/dL (ref 250.0–450.0)
Transferrin: 309 mg/dL (ref 212.0–360.0)

## 2023-03-16 ENCOUNTER — Other Ambulatory Visit: Payer: Commercial Managed Care - HMO

## 2023-03-22 ENCOUNTER — Encounter: Payer: Commercial Managed Care - HMO | Admitting: Obstetrics and Gynecology

## 2023-04-11 ENCOUNTER — Other Ambulatory Visit: Payer: Self-pay | Admitting: Internal Medicine

## 2023-04-11 DIAGNOSIS — Z794 Long term (current) use of insulin: Secondary | ICD-10-CM

## 2023-04-21 ENCOUNTER — Other Ambulatory Visit (HOSPITAL_COMMUNITY)
Admission: RE | Admit: 2023-04-21 | Discharge: 2023-04-21 | Disposition: A | Discharge status: Home / Self Care | Payer: Commercial Managed Care - HMO | Source: Ambulatory Visit | Attending: Obstetrics and Gynecology | Admitting: Obstetrics and Gynecology

## 2023-04-21 ENCOUNTER — Encounter: Payer: Self-pay | Admitting: Obstetrics and Gynecology

## 2023-04-21 ENCOUNTER — Other Ambulatory Visit: Payer: Self-pay

## 2023-04-21 ENCOUNTER — Ambulatory Visit: Payer: Commercial Managed Care - HMO | Admitting: Obstetrics and Gynecology

## 2023-04-21 VITALS — BP 119/74 | HR 79 | Wt 154.2 lb

## 2023-04-21 DIAGNOSIS — Z124 Encounter for screening for malignant neoplasm of cervix: Secondary | ICD-10-CM | POA: Insufficient documentation

## 2023-04-21 DIAGNOSIS — Z113 Encounter for screening for infections with a predominantly sexual mode of transmission: Secondary | ICD-10-CM | POA: Diagnosis not present

## 2023-04-21 DIAGNOSIS — N9489 Other specified conditions associated with female genital organs and menstrual cycle: Secondary | ICD-10-CM

## 2023-04-21 DIAGNOSIS — N907 Vulvar cyst: Secondary | ICD-10-CM

## 2023-04-21 DIAGNOSIS — Z1331 Encounter for screening for depression: Secondary | ICD-10-CM | POA: Diagnosis not present

## 2023-04-21 DIAGNOSIS — N764 Abscess of vulva: Secondary | ICD-10-CM

## 2023-04-21 MED ORDER — SULFAMETHOXAZOLE-TRIMETHOPRIM 800-160 MG PO TABS
1.0000 | ORAL_TABLET | Freq: Two times a day (BID) | ORAL | 1 refills | Status: AC
Start: 2023-04-21 — End: ?

## 2023-04-21 NOTE — Progress Notes (Signed)
NEW GYNECOLOGY PATIENT Patient name: [name] MRN [id number]  Date of birth: 04/23/1976 Chief Complaint:   Gynecologic Exam     History:  [name] is a 47 y.o. [id number] being seen today for AUB. Feels the menses are getting worse - used to be just 78 days and now lingeinrg 7-10 days, the cramping has gotten worse and prevents going to work on 1st and 2nd day over the year. No newe medications. No nipple discharge. On changes in bowel habits. Not currently sexually active (intermittent active, female partner) - previously had tubal ligation.   The pain in groin has been for the last week. It has made it difficult for her to stand and is very uncomfortable.   Menses are 7-10 days, on heaviest days she is wearing tampon and pad and may soak through but after day 4 she wears 2 pads (for comfort) and changes every 4-6 heours Notes having right sided groin pain  Last pap on file is 2015 but feels it was 3 years ago.  Wants a breast exam today       Gynecologic History Patient's last menstrual period was 04/09/2023. Contraception: tubal ligation Last Pap: Last pap on file is 2015 but feels it was 3 years ago.  Last Mammogram: 01/2023 incomplete Last Colonoscopy: none on file  Obstetric History OB History  Gravida Para Term Preterm AB Living  5 3 3   2 3   SAB IAB Ectopic Multiple Live Births          1    # Outcome Date GA Lbr Len/2nd Weight Sex Type Anes PTL Lv  5 Term 2005 [redacted]w[redacted]d         4 Term 1998 [redacted]w[redacted]d         3 Term 1993 [redacted]w[redacted]d       LIV  2 AB           1 AB             Past Medical History:  Diagnosis Date   Bipolar 1 disorder (HCC)    Bronchitis    Depressive disorder, not elsewhere classified    DM (diabetes mellitus), type 2 (HCC)    Gestational diabetes    Hidradenitis suppurativa    Obesity    Tobacco abuse     Past Surgical History:  Procedure Laterality Date   TUBAL LIGATION      Current Outpatient Medications on File Prior to Visit  Medication  Sig Dispense Refill   atorvastatin (LIPITOR) 40 MG tablet Take 1 tablet (40 mg total) by mouth daily. 90 tablet 1   blood glucose meter kit and supplies KIT Dispense based on patient and insurance preference. Use up to four times daily as directed. 1 each 0   Insulin Pen Needle (PEN NEEDLES) 31G X 8 MM MISC 1 each by Does not apply route 3 (three) times daily. 90 each 0   Lancets (ONETOUCH ULTRASOFT) lancets Use as instructed. 100 each 12   lisinopril (ZESTRIL) 10 MG tablet Take 1 tablet (10 mg total) by mouth daily. 90 tablet 1   naproxen sodium (ALEVE) 220 MG tablet Take 440-660 mg by mouth 2 (two) times daily as needed (headache/pain).     ondansetron (ZOFRAN) 4 MG tablet TAKE 1 TABLET(4 MG) BY MOUTH EVERY 8 HOURS AS NEEDED FOR NAUSEA OR VOMITING 12 tablet 0   albuterol (VENTOLIN HFA) 108 (90 Base) MCG/ACT inhaler Inhale 1-2 puffs into the lungs every 6 (six) hours as  needed for wheezing or shortness of breath. (Patient not taking: Reported on 04/21/2023) 1 each 0   hydrOXYzine (VISTARIL) 25 MG capsule Take 1 capsule (25 mg total) by mouth 3 (three) times daily as needed. (Patient not taking: Reported on 04/21/2023) 30 capsule 0   LEVEMIR FLEXTOUCH 100 UNIT/ML Pen Inject 14 Units into the skin daily. (Patient not taking: Reported on 04/21/2023)     Lurasidone HCl (LATUDA) 60 MG TABS Take 60 mg by mouth. (Patient not taking: Reported on 04/21/2023)     Semaglutide,0.25 or 0.5MG /DOS, (OZEMPIC, 0.25 OR 0.5 MG/DOSE,) 2 MG/3ML SOPN Inject 0.5 mg into the skin once a week. (Patient not taking: Reported on 04/21/2023) 3 mL 2   [DISCONTINUED] glipiZIDE (GLUCOTROL) 10 MG tablet Take 1 tablet (10 mg total) by mouth 2 (two) times daily before a meal. (Patient not taking: Reported on 02/22/2019) 30 tablet 0   [DISCONTINUED] lamoTRIgine (LAMICTAL) 200 MG tablet Take 1 tablet (200 mg total) by mouth daily. (Patient not taking: Reported on 02/22/2019) 30 tablet 0   [DISCONTINUED] metFORMIN (GLUCOPHAGE) 500 MG tablet  Take 1 tablet (500 mg total) by mouth 2 (two) times daily with a meal. (Patient not taking: Reported on 02/22/2019) 30 tablet 0   [DISCONTINUED] risperiDONE (RISPERDAL M-TABS) 3 MG disintegrating tablet Take 1 tablet (3 mg total) by mouth at bedtime. (Patient not taking: Reported on 10/25/2015) 30 tablet 0   No current facility-administered medications on file prior to visit.    No Known Allergies  Social History:  reports that she has been smoking cigarettes. She has never used smokeless tobacco. She reports current alcohol use of about 2.0 standard drinks of alcohol per week. She reports current drug use. Drug: Marijuana.  Family History  Problem Relation Age of Onset   Hypertension Mother    Schizophrenia Mother    Hypertension Father    Diabetes Father     The following portions of the patient's history were reviewed and updated as appropriate: allergies, current medications, past family history, past medical history, past social history, past surgical history and problem list.  Review of Systems Pertinent items noted in HPI and remainder of comprehensive ROS otherwise negative.  Physical Exam:  BP 119/74   Pulse 79   Wt 154 lb 3.2 oz (69.9 kg)   LMP 04/09/2023 Comment: normally last 7-10 days, heavy, clotting, wears tampon and maxi pad together and changes it every 3 hours  BMI 27.32 kg/m  Physical Exam Vitals and nursing note reviewed. Exam conducted with a chaperone present.  Constitutional:      Appearance: Normal appearance.  Cardiovascular:     Rate and Rhythm: Normal rate.  Pulmonary:     Effort: Pulmonary effort is normal.     Breath sounds: Normal breath sounds.  Genitourinary:    General: Normal vulva.     Exam position: Lithotomy position.     Vagina: Normal.     Cervix: Normal.     Comments: right labia majora with tender, fluctuant area with purulent discharge and erythema near groin fold 3.5cm cystic collection in left lower labia majora, nontender without  overlying erythema  Neurological:     General: No focal deficit present.     Mental Status: She is alert and oriented to person, place, and time.  Psychiatric:        Mood and Affect: Mood normal.        Behavior: Behavior normal.        Thought Content: Thought content normal.  Judgment: Judgment normal.        Assessment and Plan:   1. Endometrial mass HSG to better assess endometrial lesion and determine if in office sampling vs hysteroscopy required.  - [place] Sonohysterogram; Future  2. Vulvar abscess Patient with history of DM and new erythematous mass with purulent discharge concerning for abscess. Treat with bactrim and return for re-evaluation of lesion. Contrlateral labia with  - sulfamethoxazole-trimethoprim (BACTRIM DS) 800-160 MG tablet; Take 1 tablet by mouth 2 (two) times daily.  Dispense: 14 tablet; Refill: 1 - Anaerobic and Aerobic Culture  3. Screening for cervical cancer - Cytology - PAP  4. Screening examination for STI - Hepatitis C antibody - Hepatitis B Surface AntiGEN - HIV antibody (with reflex) - RPR  5. Labial cyst Appears to be benign cyst on contralateral labia. Patient requesting removal. Recommend completion of vulvar abscess treatment and [place]. Cyst removal and endometrial sampling can be completed concomitantly.     Routine preventative health maintenance measures emphasized. Please refer to After Visit Summary for other counseling recommendations.        Lorriane Shire, MD Obstetrician & Gynecologist, Faculty Practice [facility]nec[facility] for [employer], [facility]

## 2023-04-21 NOTE — Patient Instructions (Addendum)
Call [phone] on the first day of your menstrual period. Your appointment will be scheduled for 7-10 days after.   Call our office at [phone] if any problems scheduling.

## 2023-04-22 LAB — HEPATITIS C ANTIBODY: Hep C Virus Ab: NONREACTIVE

## 2023-04-22 LAB — HIV ANTIBODY (ROUTINE TESTING W REFLEX): HIV Screen 4th Generation wRfx: NONREACTIVE

## 2023-04-22 LAB — RPR: RPR Ser Ql: NONREACTIVE

## 2023-04-22 LAB — HEPATITIS B SURFACE ANTIGEN: Hepatitis B Surface Ag: NEGATIVE

## 2023-04-25 NOTE — BH Specialist Note (Signed)
Integrated Behavioral Health via Telemedicine Visit  05/06/2023 KETZALY CARDELLA [id number]  Number of Integrated Behavioral Health Clinician visits: 1- Initial Visit  Session Start time: [phone]   Session End time: 0858  Total time in minutes: 42   Referring Provider: Lorriane Shire, MD Patient/Family location: Home [facility] Provider location: Center for [facility] at [facility]  All persons participating in visit: Patient Britzy Graul and [facility] Couper Juncaj   Types of Service: Individual psychotherapy and Telephone visit  I connected with Isac Caddy and/or Pecola Lawless Schepp's  n/s  via  Telephone or Video Enabled Telemedicine Application  (Video is Caregility application) and verified that I am speaking with the correct person using two identifiers. Discussed confidentiality: Yes   I discussed the limitations of telemedicine and the availability of in person appointments.  Discussed there is a possibility of technology failure and discussed alternative modes of communication if that failure occurs.  I discussed that engaging in this telemedicine visit, they consent to the provision of behavioral healthcare and the services will be billed under their insurance.  Patient and/or legal guardian expressed understanding and consented to Telemedicine visit: Yes   Presenting Concerns: Patient and/or family reports the following symptoms/concerns: Life stress (health issues, helping care for grandchildren and grandmother; worry about job after sativa drink lead to positive drug screening); just began taking Latuda and Hydroxyzine yesterday, via PCP office with side effect of stomach hurting; noticing that worry/stress leads to chest hurting; uses self-coping strategies effectively; open to implementing additional strategy today.  Duration of problem: Ongoing; Severity of problem: moderate  Patient and/or Family's Strengths/Protective Factors: Concrete supports  in place (healthy food, safe environments, etc.) and Sense of purpose  Goals Addressed: Patient will:  Reduce symptoms of: anxiety, depression, and stress   Increase knowledge and/or ability of: self-management skills   Demonstrate ability to: Increase healthy adjustment to current life circumstances and Increase motivation to adhere to plan of care  Progress towards Goals: Ongoing  Interventions: Interventions utilized:  CBT Cognitive Behavioral Therapy, Psychoeducation and/or Health Education, and Link to [employer] Standardized Assessments completed: Not Needed  Patient and/or Family Response: Patient agrees with treatment plan.   Assessment: Patient currently experiencing Bipolar 1 disorder; Psychosocial stress.   Patient may benefit from psychoeducation and brief therapeutic interventions regarding coping with symptoms of anxiety, depression; life stress .  Plan: Follow up with behavioral health clinician on : Two weeks Behavioral recommendations:  -Continue taking Latuda and Hydroxyzine as prescribed -Continue using self-coping strategies daily as needed (meditation/mindfulness; yoga; relaxation breathing) -Begin Worry Time strategy, as discussed. Start by setting up start and end time reminders on phone today; continue daily for two weeks. -Consider additional resources on After Visit Summary; use as needed Referral(s): Integrated Art gallery manager (In Clinic) and [employer] Resources:  Childcare; [employer]  I discussed the assessment and treatment plan with the patient and/or parent/guardian. They were provided an opportunity to ask questions and all were answered. They agreed with the plan and demonstrated an understanding of the instructions.   They were advised to call back or seek an in-person evaluation if the symptoms worsen or if the condition fails to improve as anticipated.  Rae Lips, LCSW     04/21/2023   11:13 AM 02/25/2023     9:40 AM 07/04/2015    9:03 AM 11/07/2014    3:09 PM 10/16/2014   11:30 AM  Depression screen PHQ 2/9  Decreased Interest 1 1  0 0 0  Down, Depressed, Hopeless 1 1 1  0 2  PHQ - 2 Score 2 2 1  0 2  Altered sleeping 1 2     Tired, decreased energy 1 2     Change in appetite 3 1     Feeling bad or failure about yourself  1 2     Trouble concentrating 1 1     Moving slowly or fidgety/restless 0 1     Suicidal thoughts 0 0     PHQ-9 Score 9 11     Difficult doing work/chores  Somewhat difficult         04/21/2023   11:13 AM  GAD 7 : Generalized Anxiety Score  Nervous, Anxious, on Edge 3  Control/stop worrying 1  Worry too much - different things 2  Trouble relaxing 2  Restless 1  Easily annoyed or irritable 1  Afraid - awful might happen 1  Total GAD 7 Score 11

## 2023-04-26 LAB — CYTOLOGY - PAP
Adequacy: ABSENT
Chlamydia: NEGATIVE
Comment: NEGATIVE
Comment: NEGATIVE
Comment: NEGATIVE
Comment: NORMAL
Diagnosis: NEGATIVE
High risk HPV: NEGATIVE
Neisseria Gonorrhea: NEGATIVE
Trichomonas: NEGATIVE

## 2023-04-27 ENCOUNTER — Encounter: Payer: Self-pay | Admitting: Gastroenterology

## 2023-04-27 LAB — ANAEROBIC AND AEROBIC CULTURE

## 2023-05-04 ENCOUNTER — Encounter: Payer: Self-pay | Admitting: Family Medicine

## 2023-05-04 ENCOUNTER — Ambulatory Visit: Payer: Managed Care, Other (non HMO) | Admitting: Internal Medicine

## 2023-05-04 ENCOUNTER — Ambulatory Visit (INDEPENDENT_AMBULATORY_CARE_PROVIDER_SITE_OTHER): Payer: Managed Care, Other (non HMO) | Admitting: Family Medicine

## 2023-05-04 VITALS — BP 118/78 | HR 68 | Temp 98.4°F | Wt 156.8 lb

## 2023-05-04 DIAGNOSIS — J4520 Mild intermittent asthma, uncomplicated: Secondary | ICD-10-CM | POA: Diagnosis not present

## 2023-05-04 DIAGNOSIS — F319 Bipolar disorder, unspecified: Secondary | ICD-10-CM | POA: Diagnosis not present

## 2023-05-04 DIAGNOSIS — E119 Type 2 diabetes mellitus without complications: Secondary | ICD-10-CM

## 2023-05-04 DIAGNOSIS — Z794 Long term (current) use of insulin: Secondary | ICD-10-CM | POA: Diagnosis not present

## 2023-05-04 MED ORDER — LURASIDONE HCL 40 MG PO TABS: 40.0000 mg | ORAL_TABLET | Freq: Every day | ORAL | 0 refills | Status: DC

## 2023-05-04 MED ORDER — HYDROXYZINE PAMOATE 25 MG PO CAPS: 25.0000 mg | ORAL_CAPSULE | Freq: Three times a day (TID) | ORAL | 0 refills | Status: DC | PRN

## 2023-05-04 MED ORDER — LEVEMIR FLEXTOUCH 100 UNIT/ML ~~LOC~~ SOPN: 12.0000 [IU] | PEN_INJECTOR | Freq: Two times a day (BID) | SUBCUTANEOUS | 0 refills | Status: DC

## 2023-05-04 MED ORDER — ALBUTEROL SULFATE HFA 108 (90 BASE) MCG/ACT IN AERS: 1.0000 | INHALATION_SPRAY | RESPIRATORY_TRACT | 0 refills | Status: AC | PRN

## 2023-05-04 NOTE — Progress Notes (Signed)
   Subjective:    Patient ID: [name], female    DOB: 04/21/1976, 47 y.o.   MRN: [id number]  HPI Here asking for medication refills. She sees Dr. Ardyth Harps for primary care, but we will fill in while she is out of the office. [name] was out of insurance for awhile, so she was not taking any medication. She now has insurance again. For her diabetes, the last A1c in her chart from 02-25-23 was 8.9%. She does not check her glucoses at home. She has been trying to watch her diet. She also has bipolar disorder, and she has been experiencing a lot of anxiety lately. Her depression symptoms have not been as much of an issue lately. She had tried taking Ozempic but she had to stop it due to abdominal pain and nausea.    Review of Systems  Constitutional: Negative.   Respiratory: Negative.    Cardiovascular: Negative.   Gastrointestinal: Negative.   Genitourinary: Negative.   Neurological: Negative.   Psychiatric/Behavioral:  Negative for agitation, behavioral problems, confusion, decreased concentration, dysphoric mood, hallucinations, self-injury, sleep disturbance and suicidal ideas. The patient is nervous/anxious.        Objective:   Physical Exam Constitutional:      Appearance: Normal appearance.  Cardiovascular:     Rate and Rhythm: Normal rate and regular rhythm.     Pulses: Normal pulses.     Heart sounds: Normal heart sounds.  Pulmonary:     Effort: Pulmonary effort is normal.     Breath sounds: Normal breath sounds.  Neurological:     Mental Status: She is alert and oriented to person, place, and time.  Psychiatric:        Mood and Affect: Mood normal.        Behavior: Behavior normal.        Thought Content: Thought content normal.           Assessment & Plan:  Her diabetes is poorly controlled, so we will start her back on Levemir 12 units BID. For the bipolar disorder, we started her back on Latuda 40 mg daily and Hydroxyzine 25 mg as needed for anxiety. For the  asthma, we refilled her Albuterol inhaler. She is scheduled to follow up with Dr. Ardyth Harps on 05-30-23.  Gershon Crane, MD

## 2023-05-06 ENCOUNTER — Ambulatory Visit (INDEPENDENT_AMBULATORY_CARE_PROVIDER_SITE_OTHER): Payer: MEDICAID | Admitting: Clinical

## 2023-05-06 DIAGNOSIS — F319 Bipolar disorder, unspecified: Secondary | ICD-10-CM

## 2023-05-06 DIAGNOSIS — F3131 Bipolar disorder, current episode depressed, mild: Secondary | ICD-10-CM

## 2023-05-06 DIAGNOSIS — Z658 Other specified problems related to psychosocial circumstances: Secondary | ICD-10-CM

## 2023-05-06 NOTE — Patient Instructions (Signed)
Center for [facility] at [facility] [address] [place], [place] [address] [phone] ( office) [phone] (Michele Kerlin's office)  [facility] [email or web address]   [place] [employer]  (Childcare options, Early childcare development, etc.) [email or web address]   [employer] Child Care Facility Search Engine  [email or web address]

## 2023-05-09 ENCOUNTER — Other Ambulatory Visit: Payer: Self-pay

## 2023-05-09 ENCOUNTER — Ambulatory Visit (INDEPENDENT_AMBULATORY_CARE_PROVIDER_SITE_OTHER): Payer: Commercial Managed Care - HMO | Admitting: Obstetrics and Gynecology

## 2023-05-09 VITALS — BP 163/81 | HR 85 | Wt 161.9 lb

## 2023-05-09 DIAGNOSIS — N9489 Other specified conditions associated with female genital organs and menstrual cycle: Secondary | ICD-10-CM | POA: Diagnosis not present

## 2023-05-09 DIAGNOSIS — N907 Vulvar cyst: Secondary | ICD-10-CM

## 2023-05-09 DIAGNOSIS — N764 Abscess of vulva: Secondary | ICD-10-CM

## 2023-05-09 NOTE — BH Specialist Note (Signed)
Pt did not arrive to video visit and did not answer the phone; Left HIPPA-compliant message to call back Aryahna Spagna from [facility]  [facility] at  [phone] (Ilir Mahrt's office).  ?; left MyChart message for patient.  ? ?

## 2023-05-10 NOTE — Progress Notes (Signed)
    GYNECOLOGY VISIT  Patient name: [name] MRN [id number]  Date of birth: 1976-03-13 Chief Complaint:   Labial Cyst Removal  and vulvar abscess   History:  [name] is a 47 y.o. [id number] being seen today for follow up of vulvar abscess and labial cyst removal. Abscess has completely resolved and no longer having discharge. Has not been able to scheduled HSG to assess endometrial mass. Not sure she would like to proceed with labial cyst removal in office.     Past Medical History:  Diagnosis Date   Bipolar 1 disorder (HCC)    Bronchitis    Depressive disorder, not elsewhere classified    DM (diabetes mellitus), type 2 (HCC)    Gestational diabetes    Hidradenitis suppurativa    Obesity    Tobacco abuse     Past Surgical History:  Procedure Laterality Date   TUBAL LIGATION      The following portions of the patient's history were reviewed and updated as appropriate: allergies, current medications, past family history, past medical history, past social history, past surgical history and problem list.    Review of Systems:  Pertinent items are noted in HPI. Comprehensive review of systems was otherwise negative.   Objective:  Physical Exam BP (!) 163/81   Pulse 85   Wt 161 lb 14.4 oz (73.4 kg)   LMP 04/09/2023 Comment: normally last 7-10 days, heavy, clotting, wears tampon and maxi pad together and changes it every 3 hours  BMI 28.68 kg/m    Physical Exam Vitals and nursing note reviewed. Exam conducted with a chaperone present.  Constitutional:      Appearance: Normal appearance.  HENT:     Head: Normocephalic and atraumatic.  Pulmonary:     Effort: Pulmonary effort is normal.     Breath sounds: Normal breath sounds.  Genitourinary:    General: Normal vulva.     Exam position: Lithotomy position.     Vagina: Normal.     Cervix: Normal.     Comments: Well healed vulva from prior abscess, nontender ~3.5cm left labial cyst, nontender, mobile Skin:     General: Skin is warm and dry.  Neurological:     General: No focal deficit present.     Mental Status: She is alert.  Psychiatric:        Mood and Affect: Mood normal.        Behavior: Behavior normal.        Thought Content: Thought content normal.        Judgment: Judgment normal.         Assessment & Plan:   1. Vulvar abscess Resolved following antibiotics  2. Labial cyst Will plan for removal in OR during hysteroscopy   3. Endometrial mass Discussed waiting for HSG to be scheduled vs. Going ahead with hysteroscopy. Will plan for hysteroscopy so that tissue sampling can be obtained and labial cyst removal at that time as well. Reviewed risk, benefits, and alternatives. All questions answered.    Routine preventative health maintenance measures emphasized.  Lorriane Shire, MD [facility] for [facility], [facility]

## 2023-05-10 NOTE — H&P (View-Only) (Signed)
    GYNECOLOGY VISIT  Patient name: [name] MRN [id number]  Date of birth: 1976-03-13 Chief Complaint:   Labial Cyst Removal  and vulvar abscess   History:  [name] is a 47 y.o. [name] being seen today for follow up of vulvar abscess and labial cyst removal. Abscess has completely resolved and no longer having discharge. Has not been able to scheduled HSG to assess endometrial mass. Not sure she would like to proceed with labial cyst removal in office.     Past Medical History:  Diagnosis Date   Bipolar 1 disorder (HCC)    Bronchitis    Depressive disorder, not elsewhere classified    DM (diabetes mellitus), type 2 (HCC)    Gestational diabetes    Hidradenitis suppurativa    Obesity    Tobacco abuse     Past Surgical History:  Procedure Laterality Date   TUBAL LIGATION      The following portions of the patient's history were reviewed and updated as appropriate: allergies, current medications, past family history, past medical history, past social history, past surgical history and problem list.    Review of Systems:  Pertinent items are noted in HPI. Comprehensive review of systems was otherwise negative.   Objective:  Physical Exam BP (!) 163/81   Pulse 85   Wt 161 lb 14.4 oz (73.4 kg)   LMP 04/09/2023 Comment: normally last 7-10 days, heavy, clotting, wears tampon and maxi pad together and changes it every 3 hours  BMI 28.68 kg/m    Physical Exam Vitals and nursing note reviewed. Exam conducted with a chaperone present.  Constitutional:      Appearance: Normal appearance.  HENT:     Head: Normocephalic and atraumatic.  Pulmonary:     Effort: Pulmonary effort is normal.     Breath sounds: Normal breath sounds.  Genitourinary:    General: Normal vulva.     Exam position: Lithotomy position.     Vagina: Normal.     Cervix: Normal.     Comments: Well healed vulva from prior abscess, nontender ~3.5cm left labial cyst, nontender, mobile Skin:     General: Skin is warm and dry.  Neurological:     General: No focal deficit present.     Mental Status: She is alert.  Psychiatric:        Mood and Affect: Mood normal.        Behavior: Behavior normal.        Thought Content: Thought content normal.        Judgment: Judgment normal.         Assessment & Plan:   1. Vulvar abscess Resolved following antibiotics  2. Labial cyst Will plan for removal in OR during hysteroscopy   3. Endometrial mass Discussed waiting for HSG to be scheduled vs. Going ahead with hysteroscopy. Will plan for hysteroscopy so that tissue sampling can be obtained and labial cyst removal at that time as well. Reviewed risk, benefits, and alternatives. All questions answered.    Routine preventative health maintenance measures emphasized.  Lorriane Shire, MD [facility] for [facility], [facility]

## 2023-05-17 ENCOUNTER — Telehealth: Payer: Self-pay

## 2023-05-17 ENCOUNTER — Other Ambulatory Visit: Payer: Self-pay | Admitting: Family Medicine

## 2023-05-17 NOTE — Telephone Encounter (Signed)
Patient agreed to have her surgery scheduled w/ Dr. Briscoe Deutscher on 05/23/23 @[facility]  at 1:45 pm. Patient is aware she must arrive by 12:45 pm. Pre-op instructions and surgery details were provided by phone.

## 2023-05-18 ENCOUNTER — Encounter (HOSPITAL_BASED_OUTPATIENT_CLINIC_OR_DEPARTMENT_OTHER): Payer: Self-pay | Admitting: Obstetrics and Gynecology

## 2023-05-18 ENCOUNTER — Other Ambulatory Visit (HOSPITAL_COMMUNITY): Payer: Self-pay

## 2023-05-18 ENCOUNTER — Other Ambulatory Visit: Payer: Self-pay

## 2023-05-18 NOTE — Progress Notes (Addendum)
Spoke w/ via phone for pre-op interview---pt Lab needs dos----    urine preg , surgery orders requested dr Briscoe Deutscher epic ib    Lab results------ COVID test -----patient states asymptomatic no test needed Arrive at -------1100 05-23-2023 NPO after MN NO Solid Food.  Clear liquids from MN until---1000 Med rec completed Medications to take morning of surgery -----albuterol inahler prn/bring inhaler, latuda Diabetic medication -----take 1/2 dose levemir insulin pm night before surgery, no levemir insulin day of surgery Patient instructed no nail polish to be worn day of surgery Patient instructed to bring photo id and insurance card day of surgery Patient aware to have Driver (ride ) / caregiver    for 24 hours after surgery - pt will arrange driver (will be son, does not know which son yet)  Patient Special Instructions -----none Pre-Op special Instructions -----none Patient verbalized understanding of instructions that were given at this phone interview. Patient denies chest pain, sob, fever, cough at the interview.

## 2023-05-22 NOTE — Plan of Care (Signed)
[facility] Tonsillectomy/Adenoidectomy, Postoperative PEDS care plan entered in error.

## 2023-05-23 ENCOUNTER — Ambulatory Visit (HOSPITAL_BASED_OUTPATIENT_CLINIC_OR_DEPARTMENT_OTHER)
Admission: RE | Admit: 2023-05-23 | Discharge: 2023-05-23 | Disposition: A | Discharge status: Home / Self Care | Payer: Commercial Managed Care - HMO | Attending: Obstetrics and Gynecology | Admitting: Obstetrics and Gynecology

## 2023-05-23 ENCOUNTER — Ambulatory Visit (HOSPITAL_BASED_OUTPATIENT_CLINIC_OR_DEPARTMENT_OTHER): Payer: Commercial Managed Care - HMO | Admitting: Anesthesiology

## 2023-05-23 ENCOUNTER — Other Ambulatory Visit: Payer: Self-pay

## 2023-05-23 ENCOUNTER — Ambulatory Visit: Payer: 59 | Admitting: Clinical

## 2023-05-23 ENCOUNTER — Encounter (HOSPITAL_BASED_OUTPATIENT_CLINIC_OR_DEPARTMENT_OTHER)
Admission: RE | Disposition: A | Discharge status: Home / Self Care | Payer: Self-pay | Source: Home / Self Care | Attending: Obstetrics and Gynecology

## 2023-05-23 ENCOUNTER — Encounter (HOSPITAL_BASED_OUTPATIENT_CLINIC_OR_DEPARTMENT_OTHER): Payer: Self-pay | Admitting: Obstetrics and Gynecology

## 2023-05-23 DIAGNOSIS — F17200 Nicotine dependence, unspecified, uncomplicated: Secondary | ICD-10-CM | POA: Insufficient documentation

## 2023-05-23 DIAGNOSIS — D219 Benign neoplasm of connective and other soft tissue, unspecified: Secondary | ICD-10-CM

## 2023-05-23 DIAGNOSIS — Z91199 Patient's noncompliance with other medical treatment and regimen due to unspecified reason: Secondary | ICD-10-CM

## 2023-05-23 DIAGNOSIS — N907 Vulvar cyst: Secondary | ICD-10-CM | POA: Diagnosis not present

## 2023-05-23 DIAGNOSIS — N939 Abnormal uterine and vaginal bleeding, unspecified: Secondary | ICD-10-CM | POA: Diagnosis present

## 2023-05-23 DIAGNOSIS — Z794 Long term (current) use of insulin: Secondary | ICD-10-CM | POA: Insufficient documentation

## 2023-05-23 DIAGNOSIS — I10 Essential (primary) hypertension: Secondary | ICD-10-CM | POA: Diagnosis not present

## 2023-05-23 DIAGNOSIS — E119 Type 2 diabetes mellitus without complications: Secondary | ICD-10-CM | POA: Insufficient documentation

## 2023-05-23 DIAGNOSIS — D259 Leiomyoma of uterus, unspecified: Secondary | ICD-10-CM | POA: Insufficient documentation

## 2023-05-23 DIAGNOSIS — Z01818 Encounter for other preprocedural examination: Secondary | ICD-10-CM

## 2023-05-23 DIAGNOSIS — F319 Bipolar disorder, unspecified: Secondary | ICD-10-CM | POA: Diagnosis not present

## 2023-05-23 HISTORY — PX: HYSTEROSCOPY WITH D & C: SHX1775

## 2023-05-23 HISTORY — DX: Dorsalgia, unspecified: M54.9

## 2023-05-23 LAB — GLUCOSE, CAPILLARY
Glucose-Capillary: 173 mg/dL — ABNORMAL HIGH (ref 70–99)
Glucose-Capillary: 193 mg/dL — ABNORMAL HIGH (ref 70–99)

## 2023-05-23 LAB — POCT PREGNANCY, URINE
Preg Test, Ur: NEGATIVE
Preg Test, Ur: NEGATIVE

## 2023-05-23 SURGERY — DILATATION AND CURETTAGE /HYSTEROSCOPY
Anesthesia: General

## 2023-05-23 MED ORDER — EPHEDRINE 5 MG/ML INJ
INTRAVENOUS | Status: AC
  Filled 2023-05-23: qty 5

## 2023-05-23 MED ORDER — PROPOFOL 10 MG/ML IV BOLUS
INTRAVENOUS | Status: DC | PRN
  Administered 2023-05-23: 200 mg via INTRAVENOUS

## 2023-05-23 MED ORDER — EPHEDRINE SULFATE (PRESSORS) 50 MG/ML IJ SOLN
INTRAMUSCULAR | Status: DC | PRN
  Administered 2023-05-23 (×2): 10 mg via INTRAVENOUS

## 2023-05-23 MED ORDER — SODIUM CHLORIDE 0.9 % IR SOLN
Status: DC | PRN
  Administered 2023-05-23: 6000 mL
  Administered 2023-05-23: 3000 mL

## 2023-05-23 MED ORDER — ACETAMINOPHEN 325 MG PO TABS: 325.0000 mg | ORAL_TABLET | ORAL | Status: DC | PRN

## 2023-05-23 MED ORDER — FENTANYL CITRATE (PF) 100 MCG/2ML IJ SOLN
INTRAMUSCULAR | Status: AC
  Filled 2023-05-23: qty 2

## 2023-05-23 MED ORDER — KETOROLAC TROMETHAMINE 30 MG/ML IJ SOLN
INTRAMUSCULAR | Status: AC
  Filled 2023-05-23: qty 1

## 2023-05-23 MED ORDER — ONDANSETRON HCL 4 MG/2ML IJ SOLN
INTRAMUSCULAR | Status: AC
  Filled 2023-05-23: qty 2

## 2023-05-23 MED ORDER — ONDANSETRON HCL 4 MG/2ML IJ SOLN
INTRAMUSCULAR | Status: DC | PRN
  Administered 2023-05-23: 4 mg via INTRAVENOUS

## 2023-05-23 MED ORDER — MEPERIDINE HCL 25 MG/ML IJ SOLN: 6.2500 mg | INTRAMUSCULAR | Status: DC | PRN

## 2023-05-23 MED ORDER — FENTANYL CITRATE (PF) 100 MCG/2ML IJ SOLN: 25.0000 ug | INTRAMUSCULAR | Status: DC | PRN

## 2023-05-23 MED ORDER — ONDANSETRON HCL 4 MG/2ML IJ SOLN: 4.0000 mg | Freq: Once | INTRAMUSCULAR | Status: DC | PRN

## 2023-05-23 MED ORDER — ACETAMINOPHEN 500 MG PO TABS
ORAL_TABLET | ORAL | Status: AC
  Filled 2023-05-23: qty 2

## 2023-05-23 MED ORDER — MIDAZOLAM HCL 5 MG/5ML IJ SOLN
INTRAMUSCULAR | Status: DC | PRN
  Administered 2023-05-23: 2 mg via INTRAVENOUS

## 2023-05-23 MED ORDER — DEXAMETHASONE SODIUM PHOSPHATE 10 MG/ML IJ SOLN
INTRAMUSCULAR | Status: AC
  Filled 2023-05-23: qty 1

## 2023-05-23 MED ORDER — FENTANYL CITRATE (PF) 100 MCG/2ML IJ SOLN
INTRAMUSCULAR | Status: DC | PRN
  Administered 2023-05-23 (×2): 50 ug via INTRAVENOUS
  Administered 2023-05-23: 100 ug via INTRAVENOUS

## 2023-05-23 MED ORDER — LIDOCAINE HCL (CARDIAC) PF 100 MG/5ML IV SOSY
PREFILLED_SYRINGE | INTRAVENOUS | Status: DC | PRN
  Administered 2023-05-23: 100 mg via INTRAVENOUS

## 2023-05-23 MED ORDER — LACTATED RINGERS IV SOLN: INTRAVENOUS | Status: DC

## 2023-05-23 MED ORDER — ACETAMINOPHEN 160 MG/5ML PO SOLN: 325.0000 mg | ORAL | Status: DC | PRN

## 2023-05-23 MED ORDER — OXYCODONE HCL 5 MG/5ML PO SOLN: 5.0000 mg | Freq: Once | ORAL | Status: DC | PRN

## 2023-05-23 MED ORDER — LIDOCAINE HCL (PF) 2 % IJ SOLN
INTRAMUSCULAR | Status: AC
  Filled 2023-05-23: qty 5

## 2023-05-23 MED ORDER — KETOROLAC TROMETHAMINE 30 MG/ML IJ SOLN: 30.0000 mg | Freq: Once | INTRAMUSCULAR | Status: DC | PRN

## 2023-05-23 MED ORDER — BUPIVACAINE HCL (PF) 0.5 % IJ SOLN
INTRAMUSCULAR | Status: DC | PRN
  Administered 2023-05-23: 25 mL

## 2023-05-23 MED ORDER — ACETAMINOPHEN 10 MG/ML IV SOLN
INTRAVENOUS | Status: AC
  Filled 2023-05-23: qty 100

## 2023-05-23 MED ORDER — OXYCODONE HCL 5 MG PO TABS: 5.0000 mg | ORAL_TABLET | ORAL | 0 refills | Status: DC | PRN

## 2023-05-23 MED ORDER — OXYCODONE HCL 5 MG PO TABS: 5.0000 mg | ORAL_TABLET | Freq: Once | ORAL | Status: DC | PRN

## 2023-05-23 MED ORDER — MIDAZOLAM HCL 2 MG/2ML IJ SOLN
INTRAMUSCULAR | Status: AC
  Filled 2023-05-23: qty 2

## 2023-05-23 MED ORDER — ACETAMINOPHEN 500 MG PO TABS
1000.0000 mg | ORAL_TABLET | ORAL | Status: AC
  Administered 2023-05-23: 1000 mg via ORAL

## 2023-05-23 MED ORDER — KETOROLAC TROMETHAMINE 30 MG/ML IJ SOLN
INTRAMUSCULAR | Status: DC | PRN
  Administered 2023-05-23: 30 mg via INTRAVENOUS

## 2023-05-23 SURGICAL SUPPLY — 24 items
BLADE SURG 15 STRL LF DISP TIS (BLADE) IMPLANT
BLADE SURG 15 STRL SS (BLADE) ×1
CATH ROBINSON RED A/P 16FR (CATHETERS) IMPLANT
DEVICE MYOSURE LITE (MISCELLANEOUS) IMPLANT
DEVICE MYOSURE REACH (MISCELLANEOUS) IMPLANT
DILATOR CANAL MILEX (MISCELLANEOUS) IMPLANT
GAUZE 4X4 16PLY ~~LOC~~+RFID DBL (SPONGE) IMPLANT
GLOVE BIO SURGEON STRL SZ7 (GLOVE) ×1 IMPLANT
GLOVE BIOGEL PI IND STRL 7.0 (GLOVE) ×1 IMPLANT
GOWN STRL REUS W/ TWL XL LVL3 (GOWN DISPOSABLE) ×1 IMPLANT
GOWN STRL REUS W/TWL XL LVL3 (GOWN DISPOSABLE) ×1
IV NS IRRIG 3000ML ARTHROMATIC (IV SOLUTION) IMPLANT
KIT PROCEDURE FLUENT (KITS) ×1 IMPLANT
KIT TURNOVER CYSTO (KITS) ×1 IMPLANT
MYOSURE XL FIBROID (MISCELLANEOUS) ×1
PACK VAGINAL MINOR WOMEN LF (CUSTOM PROCEDURE TRAY) ×1 IMPLANT
PAD OB MATERNITY 4.3X12.25 (PERSONAL CARE ITEMS) ×1 IMPLANT
SEAL CERVICAL OMNI LOK (ABLATOR) IMPLANT
SEAL ROD LENS SCOPE MYOSURE (ABLATOR) ×1 IMPLANT
SLEEVE SCD COMPRESS KNEE MED (STOCKING) ×1 IMPLANT
SUT VIC AB 4-0 SH 27 (SUTURE) ×1
SUT VIC AB 4-0 SH 27XANBCTRL (SUTURE) IMPLANT
SYSTEM TISS REMOVAL MYOSURE XL (MISCELLANEOUS) IMPLANT
TOWEL OR 17X24 6PK STRL BLUE (TOWEL DISPOSABLE) ×1 IMPLANT

## 2023-05-23 NOTE — Discharge Instructions (Addendum)
Post Anesthesia Home Care Instructions  Activity: Get plenty of rest for the remainder of the day. A responsible individual must stay with you for 24 hours following the procedure.  For the next 24 hours, DO NOT: -Drive a car -Advertising copywriter -Drink alcoholic beverages -Take any medication unless instructed by your physician -Make any legal decisions or sign important papers.  Meals: Start with liquid foods such as gelatin or soup. Progress to regular foods as tolerated. Avoid greasy, spicy, heavy foods. If nausea and/or vomiting occur, drink only clear liquids until the nausea and/or vomiting subsides. Call your physician if vomiting continues.  Special Instructions/Symptoms: Your throat may feel dry or sore from the anesthesia or the breathing tube placed in your throat during surgery. If this causes discomfort, gargle with warm salt water. The discomfort should disappear within 24 hours.  If you had a scopolamine patch placed behind your ear for the management of post- operative nausea and/or vomiting:  1. The medication in the patch is effective for 72 hours, after which it should be removed.  Wrap patch in a tissue and discard in the trash. Wash hands thoroughly with soap and water. 2. You may remove the patch earlier than 72 hours if you experience unpleasant side effects which may include dry mouth, dizziness or visual disturbances. 3. Avoid touching the patch. Wash your hands with soap and water after contact with the patch.  No ibuprofen, Advil, Aleve, Motrin, ketorolac, meloxicam, naproxen, or other NSAIDS until after 5:30 pm today if needed. No acetaminophen/Tylenol until after 430 pm today if needed.   Post-surgical Instructions, Outpatient Surgery  You may expect to feel dizzy, weak, and drowsy for as long as 24 hours after receiving the medicine that made you sleep (anesthetic). For the first 24 hours after your surgery:   Do not drive a car, ride a bicycle, participate  in physical activities, or take public transportation until you are done taking narcotic pain medicines or as directed by Dr. Briscoe Deutscher.  Do not drink alcohol or take tranquilizers.  Do not take medicine that has not been prescribed by your physicians.  Do not sign important papers or make important decisions while on narcotic pain medicines.  Have a responsible person with you.   CARE OF INCISION If you have a bandage, you may remove it in one day.  If there are steri-strips or dermabond, just let this loosen on its own.  You may shower on the first day after your surgery.  Do not sit in a tub bath for one week. Avoid activities that may risk injury to your incisions.   PAIN MANAGEMENT Ibuprofen 800mg .  (This is the same as 4-200mg  over the counter tablets of Motrin or ibuprofen.)  Take this every 6 hours or as needed for cramping.   Acetaminophen 1000mg  (This is the same as 2-500mg  over the counter extra strength tylenol). Take this every 6 hours for the first 3 days or as needed afterwards for pain Oxycodone 5mg  For more severe pain, take one or two tablets every four to six hours as needed for pain control.  (Remember that narcotic pain medications increase your risk of constipation.  If this becomes a problem, you may take an over the counter laxative like miralax.)  DO'S AND DON'T'S Do not take a tub bath for 4 weeks.  You may shower on the first day after your surgery Do move around as you feel able.  Stairs are fine.  You may begin to exercise  again as you feel able.  Do not lift any weights for two weeks. Do not put anything in the vagina for two weeks--no tampons, intercourse, or douching.    REGULAR MEDIATIONS/VITAMINS: You may restart all of your regular medications as prescribed. You may restart all of your vitamins as you normally take them.    PLEASE CALL OR SEEK MEDICAL CARE IF: You have persistent nausea and vomiting.  You have trouble eating or drinking.  You have an oral  temperature above 100.5.  You have constipation that is not helped by adjusting diet or increasing fluid intake. Pain medicines are a common cause of constipation.  You have heavy vaginal bleeding You have redness or drainage from your incision(s) or there is increasing pain or tenderness near or in the surgical site.

## 2023-05-23 NOTE — Interval H&P Note (Signed)
History and Physical Interval Note:  05/23/2023 9:35 AM  [name]  has presented today for surgery, with the diagnosis of Labial cyst.  The various methods of treatment have been discussed with the patient and family. After consideration of risks, benefits and other options for treatment, the patient has consented to  Procedure(s): DILATATION AND CURETTAGE /HYSTEROSCOPY FOR LABIAL CYST REMOVAL (N/A) as a surgical intervention.  The patient's history has been reviewed, patient examined, no change in status, stable for surgery.  I have reviewed the patient's chart and labs.  Questions were answered to the patient's satisfaction.     Axel Meas

## 2023-05-23 NOTE — Op Note (Signed)
[facility] PROCEDURE DATE: 05/23/2023  PREOPERATIVE DIAGNOSIS: endometrial mass, abnormal uterine bleeding, labial cyst  POSTOPERATIVE DIAGNOSIS: same PROCEDURE:    hysteroscopic myomectomy, dilation and curettage, left labial cyst removal  SURGEON: Lorriane Shire, MD ASSISTANT: none  INDICATIONS: 47 y.o. [id number] with endometrial mass, AUB, and labial cyst.  Risks of surgery were discussed with the patient including but not limited to: bleeding which may require transfusion; infection which may require antibiotics; injury to surrounding organs; need for additional procedures including laparotomy;  and other postoperative/anesthesia complications. Written informed consent was obtained.    FINDINGS:  Normal external genitalia, normal appearing cervix Hysteroscopically: normal endocervical canal, bilateral tubal ostia visualized, fundal fibroid  ANESTHESIA: General, paracervical block, local anesthetic INTRAVENOUS FLUIDS:  300 ml of LR ESTIMATED BLOOD LOSS:  50 ml SPECIMENS: fibroid, labial cyst COMPLICATIONS:  None immediate. FLUID DEFICIT: normal saline   PROCEDURE: The patient was taken to the operating room and placed under general anesthesia. SCDs were in place.  Time out was performed. Patient was placed in dorsolithotomy in [place] stirrups. She was prepped and draped in the usual sterile fashion.  A speculum was placeed in the vagina. The cervix was visualized anteriorly and grasped with a single-tooth tenaculum. Paracervical block was performed with 0.5% bupivicaine with 20 cc injected. Sequential dilation was performed with Shawnie Pons dilators. The hysteroscope was inserted and the endometrial cavity and inspected. There was a fibroid found occupying much of the endometrial cavity with both ostia seen. The myosure XL was used to resect as much of the intracavitary portion as possible. The hysteroscope was removed. Attention was turned to the labia. The labia was re-prepped with  betadine. 5cc of 0.5% bupivicaine was injected into the overyling skin. A 15 blade was used to incise the skin. Metzenbaum scissors were used to sharply and bluntly dissect the labial cyst. Just prior to final disconnection, the cyst ruptured and found to be filled with sebaceous material. The cyst was sharply excised and the remaining defect was irrigated. The deep tissue was closed in a running fashion with 4-0 vicryl and the skin re-approximated in an interrupted fashion using 4-0 vicryl and noted to be hemostatic. All instruments were removed from the vagina. The cervix was re-inspected and noted to be hemostatic and the speculum removed.  All instrument, needle and lap counts were correct x2. The patient was awakened and is recovering in stable condition.   Lorriane Shire, MD Minimally Invasive Gynecologic Surgery  Obstetrics and Gynecology, [facility] for [facility], [facility] 05/23/2023

## 2023-05-23 NOTE — Anesthesia Preprocedure Evaluation (Signed)
Anesthesia Evaluation  Patient identified by MRN, date of birth, ID band Patient awake    Reviewed: Allergy & Precautions, H&P , NPO status , Patient's Chart, lab work & pertinent test results  Airway Mallampati: I       Dental no notable dental hx.    Pulmonary neg pulmonary ROS, Current Smoker and Patient abstained from smoking.   Pulmonary exam normal        Cardiovascular hypertension, Pt. on medications Normal cardiovascular exam     Neuro/Psych  PSYCHIATRIC DISORDERS  Depression Bipolar Disorder   negative neurological ROS     GI/Hepatic negative GI ROS, Neg liver ROS,,,  Endo/Other  negative endocrine ROSdiabetes, Type 2, Insulin Dependent    Renal/GU negative Renal ROS  negative genitourinary   Musculoskeletal negative musculoskeletal ROS (+)    Abdominal Normal abdominal exam  (+)   Peds negative pediatric ROS (+)  Hematology   Anesthesia Other Findings   Reproductive/Obstetrics negative OB ROS                             Anesthesia Physical Anesthesia Plan  ASA: 2  Anesthesia Plan: General   Post-op Pain Management:    Induction: Intravenous  PONV Risk Score and Plan: 4 or greater and Ondansetron, Midazolam and Treatment may vary due to age or medical condition  Airway Management Planned: LMA  Additional Equipment: None  Intra-op Plan:   Post-operative Plan: Extubation in OR  Informed Consent: I have reviewed the patients History and Physical, chart, labs and discussed the procedure including the risks, benefits and alternatives for the proposed anesthesia with the patient or authorized representative who has indicated his/her understanding and acceptance.     Dental advisory given  Plan Discussed with: CRNA  Anesthesia Plan Comments:        Anesthesia Quick Evaluation

## 2023-05-23 NOTE — Anesthesia Procedure Notes (Signed)
Procedure Name: LMA Insertion Date/Time: 05/23/2023 10:39 AM  Performed by: Jessica Priest, CRNAPre-anesthesia Checklist: Patient identified, Emergency Drugs available, Suction available, Patient being monitored and Timeout performed Patient Re-evaluated:Patient Re-evaluated prior to induction Oxygen Delivery Method: Circle system utilized Preoxygenation: Pre-oxygenation with 100% oxygen Induction Type: IV induction Ventilation: Mask ventilation without difficulty LMA: LMA inserted LMA Size: 4.0 Number of attempts: 1 Airway Equipment and Method: Bite block Placement Confirmation: positive ETCO2, breath sounds checked- equal and bilateral and CO2 detector Tube secured with: Tape Dental Injury: Teeth and Oropharynx as per pre-operative assessment

## 2023-05-23 NOTE — Brief Op Note (Signed)
05/23/2023  11:53 AM  PATIENT:  [name]  47 y.o. female  PRE-OPERATIVE DIAGNOSIS:  Endometrial mass, Labial cyst  POST-OPERATIVE DIAGNOSIS: endometrial mass Labial cyst  PROCEDURE:  Procedure(s): DILATATION AND CURETTAGE /HYSTEROSCOPY MYOSURE, LABIAL CYST REMOVAL (N/A)  SURGEON:  Surgeons and Role:    Lorriane Shire, MD - Primary  PHYSICIAN ASSISTANT: n/a  ASSISTANTS: none   ANESTHESIA:   local, general, and paracervical block  EBL:  50 mL   BLOOD ADMINISTERED:none  DRAINS: none   LOCAL MEDICATIONS USED:  BUPIVICAINE   SPECIMEN:  Source of Specimen:  fibroid, labial cyst  DISPOSITION OF SPECIMEN:  PATHOLOGY  COUNTS:  YES  TOURNIQUET:  * No tourniquets in log *  DICTATION: .Note written in EPIC  PLAN OF CARE: Discharge to home after PACU  PATIENT DISPOSITION:  PACU - hemodynamically stable.   Delay start of Pharmacological VTE agent (>24hrs) due to surgical blood loss or risk of bleeding: not applicable

## 2023-05-23 NOTE — Transfer of Care (Signed)
Immediate Anesthesia Transfer of Care Note  Patient: [name]  Procedure(s) Performed: Procedure(s) (LRB): DILATATION AND CURETTAGE /HYSTEROSCOPY MYOSURE, LABIAL CYST REMOVAL (N/A)  Patient Location: PACU  Anesthesia Type: GA  Level of Consciousness: awake, sedated, patient cooperative and responds to stimulation  Airway & Oxygen Therapy: Patient Spontanous Breathing and Patient connected to [place] oxygen  Post-op Assessment: Report given to PACU RN, Post -op Vital signs reviewed and stable and Patient moving all extremities  Post vital signs: Reviewed and stable  Complications: No apparent anesthesia complications

## 2023-05-24 NOTE — Anesthesia Postprocedure Evaluation (Signed)
Anesthesia Post Note  Patient: [name]  Procedure(s) Performed: DILATATION AND CURETTAGE /HYSTEROSCOPY MYOSURE, LABIAL CYST REMOVAL     Patient location during evaluation: PACU Anesthesia Type: General Level of consciousness: awake Pain management: pain level controlled Vital Signs Assessment: post-procedure vital signs reviewed and stable Respiratory status: spontaneous breathing Cardiovascular status: stable Postop Assessment: no apparent nausea or vomiting Anesthetic complications: no  No notable events documented.  Last Vitals:  Vitals:   05/23/23 1230 05/23/23 1327  BP: (!) 142/76 139/72  Pulse: 73 74  Resp: (!) 23 16  Temp: 36.7 C   SpO2: 100% 97%    Last Pain:  Vitals:   05/23/23 1205  TempSrc:   PainSc: 0-No pain                 Caren Macadam

## 2023-05-25 ENCOUNTER — Encounter (HOSPITAL_BASED_OUTPATIENT_CLINIC_OR_DEPARTMENT_OTHER): Payer: Self-pay | Admitting: Obstetrics and Gynecology

## 2023-05-25 LAB — SURGICAL PATHOLOGY

## 2023-05-29 ENCOUNTER — Other Ambulatory Visit: Payer: Self-pay | Admitting: Family Medicine

## 2023-05-30 ENCOUNTER — Ambulatory Visit: Payer: Commercial Managed Care - HMO | Admitting: Internal Medicine

## 2023-05-30 NOTE — Telephone Encounter (Signed)
Dr Ardyth Harps Pt [name] was on 05/04/23 Last refill was done by Dr Clent Ridges on 05/04/23 Please advise

## 2023-06-02 ENCOUNTER — Other Ambulatory Visit: Payer: Self-pay | Admitting: Family Medicine

## 2023-06-02 ENCOUNTER — Other Ambulatory Visit: Payer: Self-pay

## 2023-06-02 ENCOUNTER — Ambulatory Visit: Payer: MEDICAID | Admitting: Obstetrics and Gynecology

## 2023-06-02 VITALS — BP 154/92 | HR 64 | Wt 164.4 lb

## 2023-06-02 DIAGNOSIS — Z09 Encounter for follow-up examination after completed treatment for conditions other than malignant neoplasm: Secondary | ICD-10-CM | POA: Diagnosis not present

## 2023-06-02 DIAGNOSIS — Z794 Long term (current) use of insulin: Secondary | ICD-10-CM

## 2023-06-02 DIAGNOSIS — N907 Vulvar cyst: Secondary | ICD-10-CM

## 2023-06-02 DIAGNOSIS — Z1330 Encounter for screening examination for mental health and behavioral disorders, unspecified: Secondary | ICD-10-CM

## 2023-06-02 DIAGNOSIS — E119 Type 2 diabetes mellitus without complications: Secondary | ICD-10-CM

## 2023-06-02 DIAGNOSIS — N9489 Other specified conditions associated with female genital organs and menstrual cycle: Secondary | ICD-10-CM

## 2023-06-02 NOTE — Progress Notes (Signed)
   POSTOPERATIVE VISIT NOTE   Subjective:     [name] is a 47 y.o. [phone] who presents to the clinic 1 weeks status post operative hysteroscopy and labial cyst removal  for abnormal uterine bleeding and labial cyst . Eating a regular diet without difficulty. Bowel movements are normal.  Having pain at incision as well as lump on right upper mons similar to previous.  Primarily having discomfort at incision.   Also notes receiving the intraoperative photos of another patient - does not have photos with her    The following portions of the patient's history were reviewed and updated as appropriate: allergies, current medications, past family history, past medical history, past social history, past surgical history, and problem list..   Review of Systems Pertinent items are noted in HPI.    Objective:    BP (!) 154/92   Pulse 64   Wt 164 lb 6.4 oz (74.6 kg)   BMI 29.12 kg/m  General:  alert, cooperative, and no distress  Abdomen: non-tender  Incision:   healing well, no drainage, no erythema, no hernia, no seroma, no swelling, no dehiscence, incision well approximated  Pelvic:    Tender bump on right upper mons, well healed left labial incision with suture note, no discharge or erythema    Pathology Results: FINAL MICROSCOPIC DIAGNOSIS:   A. 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 LABIAL CYST, EXCISION:  - Epidermal inclusion cyst    Assessment:   Doing well postoperatively. Operative findings again reviewed. Pathology report discussed.   Plan:    1. Postop check Doing well, reviewed intraoperative findings  2. Labial cyst Benign pathology. Recommend sitz baths for comfort of incision. If bump on mons continues to worse, may need antibiotics vs I&D  3. Endometrial mass Benign fibroid on pathology   4. Type 2 diabetes  mellitus without complication, with long-term current use of insulin (HCC) Recommend follow up with PCP regarding glucose control given hyperglycemia is risk factor for vulvar abscess  Activity restrictions: none Anticipated return to work: not applicable. Follow up: as needed  Lorriane Shire, MD Obstetrician & Gynecologist, [facility] for [employer], [facility]

## 2023-06-07 ENCOUNTER — Other Ambulatory Visit: Payer: Self-pay | Admitting: Internal Medicine

## 2023-06-19 ENCOUNTER — Other Ambulatory Visit: Payer: Self-pay | Admitting: Internal Medicine

## 2023-06-28 ENCOUNTER — Telehealth: Payer: Self-pay | Admitting: *Deleted

## 2023-06-28 NOTE — Telephone Encounter (Signed)
Patient is aware.  Telephone number of insurance was given.  Patient requested an office visit.  Office visit scheduled.

## 2023-06-28 NOTE — Telephone Encounter (Signed)
  LEVEMIR FLEXPEN 100 UNIT/ML FlexPen Is no longer available.  Discontinued by mfg.  Please advise.

## 2023-07-04 ENCOUNTER — Ambulatory Visit: Payer: Commercial Managed Care - HMO | Admitting: Internal Medicine

## 2023-07-07 ENCOUNTER — Other Ambulatory Visit: Payer: Self-pay

## 2023-07-07 DIAGNOSIS — E1165 Type 2 diabetes mellitus with hyperglycemia: Secondary | ICD-10-CM

## 2023-07-08 ENCOUNTER — Other Ambulatory Visit: Payer: Commercial Managed Care - HMO

## 2023-07-09 LAB — COMPREHENSIVE METABOLIC PANEL
AG Ratio: 1.4 (calc) (ref 1.0–2.5)
ALT: 8 U/L (ref 6–29)
AST: 13 U/L (ref 10–35)
Albumin: 4 g/dL (ref 3.6–5.1)
Alkaline phosphatase (APISO): 63 U/L (ref 31–125)
BUN: 12 mg/dL (ref 7–25)
CO2: 24 mmol/L (ref 20–32)
Calcium: 9.5 mg/dL (ref 8.6–10.2)
Chloride: 106 mmol/L (ref 98–110)
Creat: 0.82 mg/dL (ref 0.50–0.99)
Globulin: 2.8 g/dL (ref 1.9–3.7)
Glucose, Bld: 169 mg/dL — ABNORMAL HIGH (ref 65–99)
Potassium: 4.2 mmol/L (ref 3.5–5.3)
Sodium: 138 mmol/L (ref 135–146)
Total Bilirubin: 0.2 mg/dL (ref 0.2–1.2)
Total Protein: 6.8 g/dL (ref 6.1–8.1)

## 2023-07-09 LAB — LIPID PANEL
Cholesterol: 228 mg/dL — ABNORMAL HIGH (ref <200)
HDL: 77 mg/dL (ref >=50)
LDL Cholesterol (Calc): 129 mg/dL — ABNORMAL HIGH
Non-HDL Cholesterol (Calc): 151 mg/dL — ABNORMAL HIGH (ref <130)
Total CHOL/HDL Ratio: 3 (calc) (ref <5.0)
Triglycerides: 113 mg/dL (ref <150)

## 2023-07-09 LAB — HEMOGLOBIN A1C
Hgb A1c MFr Bld: 8.1 %{Hb} — ABNORMAL HIGH (ref <5.7)
Mean Plasma Glucose: 186 mg/dL
eAG (mmol/L): 10.3 mmol/L

## 2023-07-09 LAB — MICROALBUMIN / CREATININE URINE RATIO
Creatinine, Urine: 42 mg/dL (ref 20–275)
Microalb Creat Ratio: 112 mg/g{creat} — ABNORMAL HIGH (ref <30)
Microalb, Ur: 4.7 mg/dL

## 2023-07-12 ENCOUNTER — Encounter: Payer: Self-pay | Admitting: "Endocrinology

## 2023-07-12 ENCOUNTER — Ambulatory Visit (INDEPENDENT_AMBULATORY_CARE_PROVIDER_SITE_OTHER): Payer: Commercial Managed Care - HMO | Admitting: "Endocrinology

## 2023-07-12 VITALS — BP 120/70 | HR 99 | Ht 63.0 in | Wt 164.6 lb

## 2023-07-12 DIAGNOSIS — E1165 Type 2 diabetes mellitus with hyperglycemia: Secondary | ICD-10-CM | POA: Diagnosis not present

## 2023-07-12 DIAGNOSIS — Z794 Long term (current) use of insulin: Secondary | ICD-10-CM | POA: Diagnosis not present

## 2023-07-12 DIAGNOSIS — E7800 Pure hypercholesterolemia, unspecified: Secondary | ICD-10-CM

## 2023-07-12 MED ORDER — LANCET DEVICE MISC: 1.0000 | Freq: Three times a day (TID) | 0 refills | Status: AC

## 2023-07-12 MED ORDER — DEXCOM G7 SENSOR MISC: 1.0000 | 0 refills | Status: AC

## 2023-07-12 MED ORDER — LANCETS MISC. MISC: 1.0000 | Freq: Three times a day (TID) | 3 refills | Status: AC

## 2023-07-12 MED ORDER — BLOOD GLUCOSE MONITORING SUPPL DEVI: 1.0000 | Freq: Three times a day (TID) | 0 refills | Status: AC

## 2023-07-12 MED ORDER — BLOOD GLUCOSE TEST VI STRP: 1.0000 | ORAL_STRIP | Freq: Three times a day (TID) | 3 refills | Status: AC

## 2023-07-12 NOTE — Patient Instructions (Signed)

## 2023-07-12 NOTE — Progress Notes (Signed)
Outpatient Endocrinology Note Julie Magnolia, MD  07/12/23   [name] 1976-02-24 [id number]  Referring Provider: Philip Aspen, Almira Bar* Primary Care Provider: Philip Aspen, Limmie Patricia, MD Reason for consultation: Subjective   Assessment & Plan  Diagnoses and all orders for this visit:  Uncontrolled type 2 diabetes mellitus with hyperglycemia (HCC) -     GAD65, IA-2, and Insulin Autoantibody serum -     C-peptide -     Basic metabolic panel -     Ambulatory referral to Podiatry -     Ambulatory referral to diabetic education  Long-term insulin use (HCC)  Pure hypercholesterolemia  Other orders -     Continuous Glucose Sensor (DEXCOM G7 SENSOR) MISC; 1 Device by Does not apply route continuous. -     Blood Glucose Monitoring Suppl DEVI; 1 each by Does not apply route in the morning, at noon, and at bedtime. May substitute to any manufacturer covered by patient's insurance. -     Glucose Blood (BLOOD GLUCOSE TEST STRIPS) STRP; 1 each by In Vitro route in the morning, at noon, and at bedtime. May substitute to any manufacturer covered by patient's insurance. -     Lancet Device MISC; 1 each by Does not apply route in the morning, at noon, and at bedtime. May substitute to any manufacturer covered by patient's insurance. -     Lancets Misc. MISC; 1 each by Does not apply route in the morning, at noon, and at bedtime. May substitute to any manufacturer covered by patient's insurance.    Diabetes Type II complicated by neuropathy  Lab Results  Component Value Date   GFR 99.76 02/25/2023   Hba1c goal less than 7, current Hba1c is  Lab Results  Component Value Date   HGBA1C 8.1 (H) 07/08/2023   Will recommend the following: Levemir 12 units qam Ordered DM education Ordered podiatry referral, r/o fungal toes Ordered labs  Tried ozempic but felt sick so requested to be put back on levemir   No known contraindications/side effects to any of above  medications Glucagon discussed and prescribed with refills on 07/12/23  -Last LD and Tg are as follows: Lab Results  Component Value Date   LDLCALC 129 (H) 07/08/2023    Lab Results  Component Value Date   TRIG 113 07/08/2023   -prescribed but not taking statin -Follow low fat diet and exercise   -Blood pressure goal <140/90 - Microalbumin/creatinine goal is < 30 -Last MA/Cr is as follows: Lab Results  Component Value Date   MICROALBUR 4.7 07/08/2023   -prescribed not taking ACE/ARB  -diet changes including salt restriction -limit eating outside -counseled BP targets per standards of diabetes care -uncontrolled blood pressure can lead to retinopathy, nephropathy and cardiovascular and atherosclerotic heart disease  Reviewed and counseled on: -A1C target -Blood sugar targets -Complications of uncontrolled diabetes  -Checking blood sugar before meals and bedtime and bring log next visit -All medications with mechanism of action and side effects -Hypoglycemia management: rule of 15's, Glucagon Emergency Kit and medical alert ID -low-carb low-fat plate-method diet -At least 20 minutes of physical activity per day -Annual dilated retinal eye exam and foot exam -compliance and follow up needs -follow up as scheduled or earlier if problem gets worse  Call if blood sugar is less than 70 or consistently above 250    Take a 15 gm snack of carbohydrate at bedtime before you go to sleep if your blood sugar is less than 100.  If you are going to fast after midnight for a test or procedure, ask your physician for instructions on how to reduce/decrease your insulin dose.    Call if blood sugar is less than 70 or consistently above 250  -Treating a low sugar by rule of 15  (15 gms of sugar every 15 min until sugar is more than 70) If you feel your sugar is low, test your sugar to be sure If your sugar is low (less than 70), then take 15 grams of a fast acting Carbohydrate (3-4  glucose tablets or glucose gel or 4 ounces of juice or regular soda) Recheck your sugar 15 min after treating low to make sure it is more than 70 If sugar is still less than 70, treat again with 15 grams of carbohydrate          Don't drive the hour of hypoglycemia  If unconscious/unable to eat or drink by mouth, use glucagon injection or nasal spray baqsimi and call 911. Can repeat again in 15 min if still unconscious.  Return in about 1 week (around 07/19/2023) for visit, labs today.   I have reviewed current medications, nurse's notes, allergies, vital signs, past medical and surgical history, family medical history, and social history for this encounter. Counseled patient on symptoms, examination findings, lab findings, imaging results, treatment decisions and monitoring and prognosis. The patient understood the recommendations and agrees with the treatment plan. All questions regarding treatment plan were fully answered.  Julie Watchtower, MD  07/12/23    History of Present Illness [name] is a 47 y.o. year old female who presents for evaluation of Type II diabetes mellitus.  [name] was first diagnosed around 2012.   Diabetes education +  Home diabetes regimen: Levemir 12 units qam  COMPLICATIONS -  MI/Stroke -  retinopathy + neuropathy + nephropathy/microalbuminuria   SYMPTOMS REVIEWED - Polyuria + Weight loss + Blurred vision  BLOOD SUGAR DATA Doesn't check blood sugar   Physical Exam  BP 120/70   Pulse 99   Ht 5\' 3"  (1.6 m)   Wt 164 lb 9.6 oz (74.7 kg)   SpO2 99%   BMI 29.16 kg/m    Constitutional: well developed, well nourished Head: normocephalic, atraumatic Eyes: sclera anicteric, no redness Neck: supple Lungs: normal respiratory effort Neurology: alert and oriented Skin: dry, no appreciable rashes Musculoskeletal: no appreciable defects Psychiatric: normal mood and affect Diabetic Foot Exam - Simple   Simple Foot Form Diabetic Foot  exam was performed with the following findings: Yes 07/12/2023 10:32 AM  Visual Inspection No deformities, no ulcerations, no other skin breakdown bilaterally: Yes Sensation Testing Intact to touch and monofilament testing bilaterally: Yes Pulse Check Posterior Tibialis and Dorsalis pulse intact bilaterally: Yes Comments Thick toe nails       Current Medications Patient's Medications  New Prescriptions   BLOOD GLUCOSE MONITORING SUPPL DEVI    1 each by Does not apply route in the morning, at noon, and at bedtime. May substitute to any manufacturer covered by patient's insurance.   CONTINUOUS GLUCOSE SENSOR (DEXCOM G7 SENSOR) MISC    1 Device by Does not apply route continuous.   GLUCOSE BLOOD (BLOOD GLUCOSE TEST STRIPS) STRP    1 each by In Vitro route in the morning, at noon, and at bedtime. May substitute to any manufacturer covered by patient's insurance.   LANCET DEVICE MISC    1 each by Does not apply route in the morning, at noon, and  at bedtime. May substitute to any manufacturer covered by patient's insurance.   LANCETS MISC. MISC    1 each by Does not apply route in the morning, at noon, and at bedtime. May substitute to any manufacturer covered by patient's insurance.  Previous Medications   ACETAMINOPHEN (TYLENOL) 500 MG TABLET    Take 500 mg by mouth every 6 (six) hours as needed.   ALBUTEROL (VENTOLIN HFA) 108 (90 BASE) MCG/ACT INHALER    Inhale 1-2 puffs into the lungs every 4 (four) hours as needed for wheezing or shortness of breath.   BLOOD GLUCOSE METER KIT AND SUPPLIES KIT    Dispense based on patient and insurance preference. Use up to four times daily as directed.   FERROUS SULFATE 325 (65 FE) MG TABLET    Take 325 mg by mouth daily with breakfast.   HYDROXYZINE (VISTARIL) 25 MG CAPSULE    Take 1 capsule (25 mg total) by mouth 3 (three) times daily as needed for anxiety.   INSULIN PEN NEEDLE (PEN NEEDLES) 31G X 8 MM MISC    1 each by Does not apply route 3 (three) times  daily.   LANCETS (ONETOUCH ULTRASOFT) LANCETS    Use as instructed.   LATUDA 40 MG TABS TABLET    TAKE 1 TABLET(40 MG) BY MOUTH DAILY WITH BREAKFAST   LEVEMIR FLEXPEN 100 UNIT/ML FLEXPEN    ADMINISTER 12 UNITS UNDER THE SKIN TWICE DAILY   LISINOPRIL (ZESTRIL) 10 MG TABLET    Take 1 tablet (10 mg total) by mouth daily.   NAPROXEN SODIUM (ALEVE) 220 MG TABLET    Take 220 mg by mouth 2 (two) times daily as needed.   ONDANSETRON (ZOFRAN) 4 MG TABLET    TAKE 1 TABLET(4 MG) BY MOUTH EVERY 8 HOURS AS NEEDED FOR NAUSEA OR VOMITING   OXYCODONE (OXY IR/ROXICODONE) 5 MG IMMEDIATE RELEASE TABLET    Take 1 tablet (5 mg total) by mouth every 4 (four) hours as needed for severe pain (pain score 7-10) or breakthrough pain.  Modified Medications   No medications on file  Discontinued Medications   No medications on file    Allergies Not on File  Past Medical History Past Medical History:  Diagnosis Date   Back pain    Bipolar 1 disorder (HCC)    Bronchitis    Depressive disorder, not elsewhere classified    DM (diabetes mellitus), type 2 (HCC)    Gestational diabetes    Hidradenitis suppurativa    Obesity    Tobacco abuse     Past Surgical History Past Surgical History:  Procedure Laterality Date   HYSTEROSCOPY WITH D & C N/A 05/23/2023   Procedure: DILATATION AND CURETTAGE /HYSTEROSCOPY MYOSURE, LABIAL CYST REMOVAL;  Surgeon: Lorriane Shire, MD;  Location: [facility];  Service: Gynecology;  Laterality: N/A;   TUBAL LIGATION      Family History family history includes Diabetes in her father; Hypertension in her father and mother; Schizophrenia in her mother.  Social History Social History   Socioeconomic History   Marital status: Single    Spouse name: Not on file   Number of children: Not on file   Years of education: Not on file   Highest education level: Some college, no degree  Occupational History   Not on file  Tobacco Use   Smoking status: Every Day     Current packs/day: 1.00    Types: Cigarettes   Smokeless tobacco: Never  Substance and Sexual Activity  Alcohol use: Yes    Alcohol/week: 2.0 standard drinks of alcohol    Types: 2 Shots of liquor per week   Drug use: Yes    Types: Marijuana    Comment: last marijuana use daily last dose 05/22/2023   Sexual activity: Yes  Other Topics Concern   Not on file  Social History Narrative   Not on file   Social Determinants of Health   Financial Resource Strain: High Risk (05/29/2023)   Overall Financial Resource Strain (CARDIA)    Difficulty of Paying Living Expenses: Hard  Food Insecurity: Food Insecurity Present (05/29/2023)   Hunger Vital Sign    Worried About Running Out of Food in the Last Year: Sometimes true    Ran Out of Food in the Last Year: Sometimes true  Transportation Needs: No Transportation Needs (05/29/2023)   PRAPARE - Administrator, Civil Service (Medical): No    Lack of Transportation (Non-Medical): No  Physical Activity: Insufficiently Active (05/29/2023)   Exercise Vital Sign    Days of Exercise per Week: 3 days    Minutes of Exercise per Session: 20 min  Stress: Stress Concern Present (05/29/2023)   [employer] of Occupational Health - Occupational Stress Questionnaire    Feeling of Stress : To some extent  Social Connections: Moderately Isolated (05/29/2023)   Social Connection and Isolation Panel [NHANES]    Frequency of Communication with Friends and Family: Three times a week    Frequency of Social Gatherings with Friends and Family: Once a week    Attends Religious Services: More than 4 times per year    Active Member of Clubs or Organizations: No    Attends Banker Meetings: Not on file    Marital Status: Never married  Intimate Partner Violence: Not on file    Lab Results  Component Value Date   HGBA1C 8.1 (H) 07/08/2023   HGBA1C 8.9 (A) 02/25/2023   HGBA1C 6.8 (H) 02/22/2019   Lab Results  Component Value  Date   CHOL 228 (H) 07/08/2023   Lab Results  Component Value Date   HDL 77 07/08/2023   Lab Results  Component Value Date   LDLCALC 129 (H) 07/08/2023   Lab Results  Component Value Date   TRIG 113 07/08/2023   Lab Results  Component Value Date   CHOLHDL 3.0 07/08/2023   Lab Results  Component Value Date   CREATININE 0.82 07/08/2023   Lab Results  Component Value Date   GFR 99.76 02/25/2023   Lab Results  Component Value Date   MICROALBUR 4.7 07/08/2023      Component Value Date/Time   NA 138 07/08/2023 0906   K 4.2 07/08/2023 0906   CL 106 07/08/2023 0906   CO2 24 07/08/2023 0906   GLUCOSE 169 (H) 07/08/2023 0906   BUN 12 07/08/2023 0906   CREATININE 0.82 07/08/2023 0906   CALCIUM 9.5 07/08/2023 0906   PROT 6.8 07/08/2023 0906   ALBUMIN 4.1 02/25/2023 0943   AST 13 07/08/2023 0906   ALT 8 07/08/2023 0906   ALKPHOS 52 02/25/2023 0943   BILITOT 0.2 07/08/2023 0906   GFRNONAA >60 02/13/2023 1300   GFRAA >60 02/22/2019 1424      Latest Ref Rng & Units 07/08/2023    9:06 AM 02/25/2023    9:43 AM 02/13/2023    1:00 PM  BMP  Glucose 65 - 99 mg/dL 151  761  607   BUN 7 - 25 mg/dL 12  12  8   Creatinine 0.50 - 0.99 mg/dL 4.09  8.11  9.14   BUN/Creat Ratio 6 - 22 (calc) SEE NOTE:     Sodium 135 - 146 mmol/L 138  133  132   Potassium 3.5 - 5.3 mmol/L 4.2  4.3  3.8   Chloride 98 - 110 mmol/L 106  104  103   CO2 20 - 32 mmol/L 24  23  22    Calcium 8.6 - 10.2 mg/dL 9.5  9.0  8.9        Component Value Date/Time   WBC 8.4 02/25/2023 0943   RBC 5.06 02/25/2023 0943   HGB 10.4 (L) 02/25/2023 0943   HCT 32.0 (L) 02/25/2023 0943   PLT 540.0 (H) 02/25/2023 0943   MCV 63.3 Repeated and verified X2. (L) 02/25/2023 0943   MCH 20.0 (L) 02/13/2023 1300   MCHC 32.6 02/25/2023 0943   RDW 20.3 (H) 02/25/2023 0943   LYMPHSABS 2.6 02/25/2023 0943   MONOABS 0.7 02/25/2023 0943   EOSABS 0.2 02/25/2023 0943   BASOSABS 0.1 02/25/2023 0943     Parts of this note may  have been dictated using voice recognition software. There may be variances in spelling and vocabulary which are unintentional. Not all errors are proofread. Please notify the Thereasa Parkin if any discrepancies are noted or if the meaning of any statement is not clear.

## 2023-07-13 ENCOUNTER — Encounter: Payer: Commercial Managed Care - HMO | Attending: "Endocrinology | Admitting: Nutrition

## 2023-07-13 VITALS — Ht 63.0 in | Wt 164.7 lb

## 2023-07-13 DIAGNOSIS — Z794 Long term (current) use of insulin: Secondary | ICD-10-CM | POA: Insufficient documentation

## 2023-07-13 DIAGNOSIS — E119 Type 2 diabetes mellitus without complications: Secondary | ICD-10-CM | POA: Insufficient documentation

## 2023-07-18 NOTE — Progress Notes (Signed)
Patient is here today to discuss her diet and diabetes in general.  Says was never given information on diabetes and how to eat.   Exercise: none Medication: was given Ozempic, but had severe nausea and stopped this.                                      Levemir: 12u qAM  Started this this AM SBGM: machine:  testing once a day.  Discussed Dexcom.  She was given on and the readings are going to her phone.  Lot #: [id number] exp.12/10/23.  Sensor was reading 123mg /dl. Diet:  meals appear balanced, but quantities are questionable.  Not clear on this.  Discussion:  What diabetes is, what has gone wrong in her body, and what she can do to decrease her insulin resistance-diet, exercise and weight loss.   Discussed how this insulin works, how,where, and when to take this.  Also storage and low blood sugar symptoms and treatment We discussed the 3 basic food groups, how they affect blood sugar, what foods fall into each group, and how much she can have of each group at each meal.  She was given a 1800 calorie diet with  3 meals and a bedtime snack, which is similar to what she is doing now.   SBGM- what the goals for blood sugar readings should be: ac: less than 110, and 2hr. Pc: less than 170.   Need for exercise to help with weight loss and insulin sensitivity- suggested walking for 20-30 minutes 5 days/wk. Believe overall understanding is good, and motivation for needed diet and exercise changes is good.

## 2023-07-19 ENCOUNTER — Ambulatory Visit (INDEPENDENT_AMBULATORY_CARE_PROVIDER_SITE_OTHER): Payer: Commercial Managed Care - HMO | Admitting: "Endocrinology

## 2023-07-19 ENCOUNTER — Telehealth: Payer: Self-pay

## 2023-07-19 ENCOUNTER — Other Ambulatory Visit (HOSPITAL_COMMUNITY): Payer: Self-pay

## 2023-07-19 ENCOUNTER — Encounter: Payer: Self-pay | Admitting: "Endocrinology

## 2023-07-19 VITALS — BP 122/70 | HR 84 | Wt 163.8 lb

## 2023-07-19 DIAGNOSIS — E7800 Pure hypercholesterolemia, unspecified: Secondary | ICD-10-CM | POA: Diagnosis not present

## 2023-07-19 DIAGNOSIS — Z794 Long term (current) use of insulin: Secondary | ICD-10-CM

## 2023-07-19 DIAGNOSIS — E1165 Type 2 diabetes mellitus with hyperglycemia: Secondary | ICD-10-CM

## 2023-07-19 MED ORDER — METFORMIN HCL ER 500 MG PO TB24: 500.0000 mg | ORAL_TABLET | Freq: Two times a day (BID) | ORAL | 2 refills | Status: DC

## 2023-07-19 NOTE — Patient Instructions (Signed)
We will start you on an extended release version of metformin, which should help with the upset stomach. Start with one 500 mg capsule taken every evening with dinner. Stay on this for 3-5 days, then increase to 1 tablet with breakfast and one with dinner. If you do not have any upset stomach, gradually increase to the goal dose of 2 capsules with breakfast and 2 capsules with dinner. If you do have upset stomach, decrease it back to the previous dose that you tolerated.  Week 1: one tablet after dinner Week 2: one tablet after breakfast and one after dinner  Week 3: one tablet after breakfast and two after dinner  Week 4 and onwards: two tablets after breakfast and two after dinner

## 2023-07-19 NOTE — Progress Notes (Signed)
Outpatient Endocrinology Note Altamese Highwood, MD  07/19/23   [name] 1976/05/02 [id number]  Referring Provider: Philip Aspen, [place]ira [place]* Primary Care Provider: Philip Aspen, Limmie Patricia, MD Reason for consultation: Subjective   Assessment & Plan  [name] was seen today for follow-up.  Diagnoses and all orders for this visit:  Uncontrolled type 2 diabetes mellitus with hyperglycemia (HCC)  Long-term insulin use (HCC)  Pure hypercholesterolemia  Other orders -     metFORMIN (GLUCOPHAGE-XR) 500 MG 24 hr tablet; Take 1 tablet (500 mg total) by mouth 2 (two) times daily with a meal.    Diabetes Type II complicated by neuropathy  Lab Results  Component Value Date   GFR 99.76 02/25/2023   Hba1c goal less than 7, current Hba1c is  Lab Results  Component Value Date   HGBA1C 8.1 (H) 07/08/2023   Will recommend the following: We will start you on an extended release version of metformin, which should help with the upset stomach. Start with one 500 mg capsule taken every evening with dinner. Stay on this for 3-5 days, then increase to 1 tablet with breakfast and one with dinner. If you do not have any upset stomach, gradually increase to the goal dose of 2 capsules with breakfast and 2 capsules with dinner. If you do have upset stomach, decrease it back to the previous dose that you tolerated.  Week 1: one tablet after dinner Week 2: one tablet after breakfast and one after dinner  Week 3: one tablet after breakfast and two after dinner  Week 4 and onwards: two tablets after breakfast and two after dinner  Stop Levemir 12 units qam once on full dose metformin (drop to 6 units every day if tolerates metformin 500 mg bid only) C-pep + Ordered DM education Ordered podiatry referral, r/o fungal toes, appt Ordered labs  Tried ozempic but felt sick so requested to be put back on levemir   No known contraindications/side effects to any of above medications Glucagon  discussed and prescribed with refills on 07/19/23  -Last LD and Tg are as follows: Lab Results  Component Value Date   LDLCALC 129 (H) 07/08/2023    Lab Results  Component Value Date   TRIG 113 07/08/2023   -prescribed but not taking statin -Follow low fat diet and exercise   -Blood pressure goal <140/90 - Microalbumin/creatinine goal is < 30 -Last MA/Cr is as follows: Lab Results  Component Value Date   MICROALBUR 4.7 07/08/2023   -not complaint with lisinopril 10 mg  -diet changes including salt restriction -limit eating outside -counseled BP targets per standards of diabetes care -uncontrolled blood pressure can lead to retinopathy, nephropathy and cardiovascular and atherosclerotic heart disease  Reviewed and counseled on: -A1C target -Blood sugar targets -Complications of uncontrolled diabetes  -Checking blood sugar before meals and bedtime and bring log next visit -All medications with mechanism of action and side effects -Hypoglycemia management: rule of 15's, Glucagon Emergency Kit and medical alert ID -low-carb low-fat plate-method diet -At least 20 minutes of physical activity per day -Annual dilated retinal eye exam and foot exam -compliance and follow up needs -follow up as scheduled or earlier if problem gets worse  Call if blood sugar is less than 70 or consistently above 250    Take a 15 gm snack of carbohydrate at bedtime before you go to sleep if your blood sugar is less than 100.    If you are going to fast after midnight for a  test or procedure, ask your physician for instructions on how to reduce/decrease your insulin dose.    Call if blood sugar is less than 70 or consistently above 250  -Treating a low sugar by rule of 15  (15 gms of sugar every 15 min until sugar is more than 70) If you feel your sugar is low, test your sugar to be sure If your sugar is low (less than 70), then take 15 grams of a fast acting Carbohydrate (3-4 glucose tablets or  glucose gel or 4 ounces of juice or regular soda) Recheck your sugar 15 min after treating low to make sure it is more than 70 If sugar is still less than 70, treat again with 15 grams of carbohydrate          Don't drive the hour of hypoglycemia  If unconscious/unable to eat or drink by mouth, use glucagon injection or nasal spray baqsimi and call 911. Can repeat again in 15 min if still unconscious.  Return in about 4 weeks (around 08/16/2023).   I have reviewed current medications, nurse's notes, allergies, vital signs, past medical and surgical history, family medical history, and social history for this encounter. Counseled patient on symptoms, examination findings, lab findings, imaging results, treatment decisions and monitoring and prognosis. The patient understood the recommendations and agrees with the treatment plan. All questions regarding treatment plan were fully answered.  Altamese [place], MD  07/19/23    History of Present Illness [name] is a 47 y.o. year old female who presents for evaluation of Type II diabetes mellitus.  [name] was first diagnosed around 2012.   Diabetes education +  Home diabetes regimen: Levemir 12 units qam  COMPLICATIONS -  MI/Stroke -  retinopathy + neuropathy + nephropathy/microalbuminuria   BLOOD SUGAR DATA  CGM interpretation: At today's visit, we reviewed her CGM downloads. The full report is scanned in the media. Reviewing the CGM trends, BG are well controlled throughout the day.  Physical Exam  BP 122/70 (BP Location: Left Arm, Patient Position: Sitting, Cuff Size: Normal)   Pulse 84   Wt 163 lb 12.8 oz (74.3 kg)   SpO2 99%   BMI 29.02 kg/m    Constitutional: well developed, well nourished Head: normocephalic, atraumatic Eyes: sclera anicteric, no redness Neck: supple Lungs: normal respiratory effort Neurology: alert and oriented Skin: dry, no appreciable rashes Musculoskeletal: no appreciable  defects Psychiatric: normal mood and affect Diabetic Foot Exam - Simple   No data filed      Current Medications Patient's Medications  New Prescriptions   METFORMIN (GLUCOPHAGE-XR) 500 MG 24 HR TABLET    Take 1 tablet (500 mg total) by mouth 2 (two) times daily with a meal.  Previous Medications   ACETAMINOPHEN (TYLENOL) 500 MG TABLET    Take 500 mg by mouth every 6 (six) hours as needed.   ALBUTEROL (VENTOLIN HFA) 108 (90 BASE) MCG/ACT INHALER    Inhale 1-2 puffs into the lungs every 4 (four) hours as needed for wheezing or shortness of breath.   ATORVASTATIN (LIPITOR) 40 MG TABLET    Take 40 mg by mouth daily.   BLOOD GLUCOSE METER KIT AND SUPPLIES KIT    Dispense based on patient and insurance preference. Use up to four times daily as directed.   BLOOD GLUCOSE MONITORING SUPPL DEVI    1 each by Does not apply route in the morning, at noon, and at bedtime. May substitute to any manufacturer covered by patient's  insurance.   CONTINUOUS GLUCOSE SENSOR (DEXCOM G7 SENSOR) MISC    1 Device by Does not apply route continuous.   FERROUS SULFATE 325 (65 FE) MG TABLET    Take 325 mg by mouth daily with breakfast.   GLUCOSE BLOOD (BLOOD GLUCOSE TEST STRIPS) STRP    1 each by In Vitro route in the morning, at noon, and at bedtime. May substitute to any manufacturer covered by patient's insurance.   HYDROXYZINE (VISTARIL) 25 MG CAPSULE    Take 1 capsule (25 mg total) by mouth 3 (three) times daily as needed for anxiety.   INSULIN PEN NEEDLE (PEN NEEDLES) 31G X 8 MM MISC    1 each by Does not apply route 3 (three) times daily.   LANCET DEVICE MISC    1 each by Does not apply route in the morning, at noon, and at bedtime. May substitute to any manufacturer covered by patient's insurance.   LANCETS (ONETOUCH ULTRASOFT) LANCETS    Use as instructed.   LANCETS MISC. MISC    1 each by Does not apply route in the morning, at noon, and at bedtime. May substitute to any manufacturer covered by patient's  insurance.   LATUDA 40 MG TABS TABLET    TAKE 1 TABLET(40 MG) BY MOUTH DAILY WITH BREAKFAST   LEVEMIR FLEXPEN 100 UNIT/ML FLEXPEN    ADMINISTER 12 UNITS UNDER THE SKIN TWICE DAILY   LISINOPRIL (ZESTRIL) 10 MG TABLET    Take 1 tablet (10 mg total) by mouth daily.   NAPROXEN SODIUM (ALEVE) 220 MG TABLET    Take 220 mg by mouth 2 (two) times daily as needed.   ONDANSETRON (ZOFRAN) 4 MG TABLET    TAKE 1 TABLET(4 MG) BY MOUTH EVERY 8 HOURS AS NEEDED FOR NAUSEA OR VOMITING   OXYCODONE (OXY IR/ROXICODONE) 5 MG IMMEDIATE RELEASE TABLET    Take 1 tablet (5 mg total) by mouth every 4 (four) hours as needed for severe pain (pain score 7-10) or breakthrough pain.  Modified Medications   No medications on file  Discontinued Medications   No medications on file    Allergies Not on File  Past Medical History Past Medical History:  Diagnosis Date   Back pain    Bipolar 1 disorder (HCC)    Bronchitis    Depressive disorder, not elsewhere classified    DM (diabetes mellitus), type 2 (HCC)    Gestational diabetes    Hidradenitis suppurativa    Obesity    Tobacco abuse     Past Surgical History Past Surgical History:  Procedure Laterality Date   HYSTEROSCOPY WITH D & C N/A 05/23/2023   Procedure: DILATATION AND CURETTAGE /HYSTEROSCOPY MYOSURE, LABIAL CYST REMOVAL;  Surgeon: Lorriane Shire, MD;  Location: [facility];  Service: Gynecology;  Laterality: N/A;   TUBAL LIGATION      Family History family history includes Diabetes in her father; Hypertension in her father and mother; Schizophrenia in her mother.  Social History Social History   Socioeconomic History   Marital status: Single    Spouse name: Not on file   Number of children: Not on file   Years of education: Not on file   Highest education level: Some college, no degree  Occupational History   Not on file  Tobacco Use   Smoking status: Every Day    Current packs/day: 1.00    Types: Cigarettes    Smokeless tobacco: Never  Substance and Sexual Activity   Alcohol use: Yes  Alcohol/week: 2.0 standard drinks of alcohol    Types: 2 Shots of liquor per week   Drug use: Yes    Types: Marijuana    Comment: last marijuana use daily last dose 05/22/2023   Sexual activity: Yes  Other Topics Concern   Not on file  Social History Narrative   Not on file   Social Drivers of Health   Financial Resource Strain: High Risk (05/29/2023)   Overall Financial Resource Strain (CARDIA)    Difficulty of Paying Living Expenses: Hard  Food Insecurity: Food Insecurity Present (05/29/2023)   Hunger Vital Sign    Worried About Running Out of Food in the Last Year: Sometimes true    Ran Out of Food in the Last Year: Sometimes true  Transportation Needs: No Transportation Needs (05/29/2023)   PRAPARE - Administrator, Civil Service (Medical): No    Lack of Transportation (Non-Medical): No  Physical Activity: Insufficiently Active (05/29/2023)   Exercise Vital Sign    Days of Exercise per Week: 3 days    Minutes of Exercise per Session: 20 min  Stress: Stress Concern Present (05/29/2023)   [employer] of Occupational Health - Occupational Stress Questionnaire    Feeling of Stress : To some extent  Social Connections: Moderately Isolated (05/29/2023)   Social Connection and Isolation Panel [NHANES]    Frequency of Communication with Friends and Family: Three times a week    Frequency of Social Gatherings with Friends and Family: Once a week    Attends Religious Services: More than 4 times per year    Active Member of Clubs or Organizations: No    Attends Banker Meetings: Not on file    Marital Status: Never married  Intimate Partner Violence: Not on file    Lab Results  Component Value Date   HGBA1C 8.1 (H) 07/08/2023   HGBA1C 8.9 (A) 02/25/2023   HGBA1C 6.8 (H) 02/22/2019   Lab Results  Component Value Date   CHOL 228 (H) 07/08/2023   Lab Results   Component Value Date   HDL 77 07/08/2023   Lab Results  Component Value Date   LDLCALC 129 (H) 07/08/2023   Lab Results  Component Value Date   TRIG 113 07/08/2023   Lab Results  Component Value Date   CHOLHDL 3.0 07/08/2023   Lab Results  Component Value Date   CREATININE 0.83 07/12/2023   Lab Results  Component Value Date   GFR 99.76 02/25/2023   Lab Results  Component Value Date   MICROALBUR 4.7 07/08/2023      Component Value Date/Time   NA 137 07/12/2023 1052   K 4.5 07/12/2023 1052   CL 106 07/12/2023 1052   CO2 24 07/12/2023 1052   GLUCOSE 154 (H) 07/12/2023 1052   BUN 14 07/12/2023 1052   CREATININE 0.83 07/12/2023 1052   CALCIUM 9.1 07/12/2023 1052   PROT 6.8 07/08/2023 0906   ALBUMIN 4.1 02/25/2023 0943   AST 13 07/08/2023 0906   ALT 8 07/08/2023 0906   ALKPHOS 52 02/25/2023 0943   BILITOT 0.2 07/08/2023 0906   GFRNONAA >60 02/13/2023 1300   GFRAA >60 02/22/2019 1424      Latest Ref Rng & Units 07/12/2023   10:52 AM 07/08/2023    9:06 AM 02/25/2023    9:43 AM  BMP  Glucose 65 - 99 mg/dL 161  096  045   BUN 7 - 25 mg/dL 14  12  12    Creatinine 0.50 -  0.99 mg/dL 2.53  6.64  4.03   BUN/Creat Ratio 6 - 22 (calc) SEE NOTE:  SEE NOTE:    Sodium 135 - 146 mmol/L 137  138  133   Potassium 3.5 - 5.3 mmol/L 4.5  4.2  4.3   Chloride 98 - 110 mmol/L 106  106  104   CO2 20 - 32 mmol/L 24  24  23    Calcium 8.6 - 10.2 mg/dL 9.1  9.5  9.0        Component Value Date/Time   WBC 8.4 02/25/2023 0943   RBC 5.06 02/25/2023 0943   HGB 10.4 (L) 02/25/2023 0943   HCT 32.0 (L) 02/25/2023 0943   PLT 540.0 (H) 02/25/2023 0943   MCV 63.3 Repeated and verified X2. (L) 02/25/2023 0943   MCH 20.0 (L) 02/13/2023 1300   MCHC 32.6 02/25/2023 0943   RDW 20.3 (H) 02/25/2023 0943   LYMPHSABS 2.6 02/25/2023 0943   MONOABS 0.7 02/25/2023 0943   EOSABS 0.2 02/25/2023 0943   BASOSABS 0.1 02/25/2023 0943     Parts of this note may have been dictated using voice  recognition software. There may be variances in spelling and vocabulary which are unintentional. Not all errors are proofread. Please notify the Thereasa Parkin if any discrepancies are noted or if the meaning of any statement is not clear.

## 2023-07-19 NOTE — Telephone Encounter (Signed)
PA needed on Dexcom

## 2023-07-20 ENCOUNTER — Other Ambulatory Visit (HOSPITAL_COMMUNITY): Payer: Self-pay

## 2023-07-20 ENCOUNTER — Telehealth: Payer: Self-pay | Admitting: *Deleted

## 2023-07-20 ENCOUNTER — Telehealth: Payer: Self-pay

## 2023-07-20 ENCOUNTER — Encounter: Payer: Self-pay | Admitting: "Endocrinology

## 2023-07-20 NOTE — Telephone Encounter (Signed)
Informed patient that the breast center has been trying to contact her regarding her mammogram.  The patient states that she will call and schedule an appointment.

## 2023-07-20 NOTE — Telephone Encounter (Signed)
Pharmacy Patient Advocate Encounter   Received notification from Pt Calls Messages that prior authorization for Dexcom G& sensor is required/requested.   Insurance verification completed.   The patient is insured through [employer] .   Per test claim: PA required; PA submitted to above mentioned insurance via CoverMyMeds Key/confirmation #/EOC [id number] Status is pending

## 2023-07-21 LAB — C-PEPTIDE: C-Peptide: 1.94 ng/mL (ref 0.80–3.85)

## 2023-07-21 LAB — GAD65, IA-2, AND INSULIN AUTOANTIBODY SERUM
Glutamic Acid Decarb Ab: <5 [IU]/mL (ref <5)
IA-2 Antibody: <5.4 U/mL (ref <5.4)
Insulin Antibodies, Human: <0.4 U/mL (ref <0.4)

## 2023-07-21 LAB — BASIC METABOLIC PANEL
BUN: 14 mg/dL (ref 7–25)
CO2: 24 mmol/L (ref 20–32)
Calcium: 9.1 mg/dL (ref 8.6–10.2)
Chloride: 106 mmol/L (ref 98–110)
Creat: 0.83 mg/dL (ref 0.50–0.99)
Glucose, Bld: 154 mg/dL — ABNORMAL HIGH (ref 65–99)
Potassium: 4.5 mmol/L (ref 3.5–5.3)
Sodium: 137 mmol/L (ref 135–146)

## 2023-07-28 ENCOUNTER — Ambulatory Visit (INDEPENDENT_AMBULATORY_CARE_PROVIDER_SITE_OTHER): Payer: Commercial Managed Care - HMO | Admitting: Podiatry

## 2023-07-28 ENCOUNTER — Encounter: Payer: Self-pay | Admitting: Podiatry

## 2023-07-28 DIAGNOSIS — M79675 Pain in left toe(s): Secondary | ICD-10-CM

## 2023-07-28 DIAGNOSIS — B351 Tinea unguium: Secondary | ICD-10-CM

## 2023-07-28 DIAGNOSIS — M79674 Pain in right toe(s): Secondary | ICD-10-CM | POA: Diagnosis not present

## 2023-07-28 DIAGNOSIS — L600 Ingrowing nail: Secondary | ICD-10-CM | POA: Diagnosis not present

## 2023-07-28 DIAGNOSIS — E119 Type 2 diabetes mellitus without complications: Secondary | ICD-10-CM

## 2023-07-28 MED ORDER — ACETAMINOPHEN-CODEINE 300-30 MG PO TABS: 1.0000 | ORAL_TABLET | Freq: Four times a day (QID) | ORAL | 0 refills | Status: DC | PRN

## 2023-07-28 NOTE — Progress Notes (Unsigned)
Subjective:  Patient ID: [name], female    DOB: 08-19-1975,  MRN: [id number]  [name] presents to clinic today for:  Chief Complaint  Patient presents with   Nail Problem    She reports she is here to establish for [facility], She reports she has an ingrown toe nail on the right big and is very painful, swollen and throbs.  Patient presenting for above complaint.  She presents primarily for painful right first toenail medial border which is becoming painful, swollen and throbs.  Patient also would like to establish care for diabetic footcare.  Last A1c 8.1. Remaining nails are thickened, elongated and painful with shoegear.  PCP is Philip Aspen, Limmie Patricia, MD.  Not on File  Review of Systems: Negative except as noted in the HPI.  Objective:  There were no vitals filed for this visit.  [name] is a pleasant 47 y.o. female in NAD. AAO x 3.  Vascular Examination: Capillary refill time is 3 seconds to toes bilateral. Palpable pedal pulses b/l LE. Digital hair present b/l. No pedal edema b/l. Skin temperature gradient WNL b/l. No varicosities b/l. No cyanosis or clubbing noted b/l.   Dermatological Examination: There is incurvation of the right hallux medial nail border.  There is pain on palpation of the affected nail border.  Patient does have polish to the remaining toenails.  They do appear thickened and are painful with direct dorsal palpation. Subungal debris present.  Neurological Examination: Protective sensation intact with Semmes-Weinstein 10 gram monofilament b/l LE. Vibratory sensation intact b/l LE.     Latest Ref Rng & Units 07/08/2023    9:06 AM 02/25/2023    9:15 AM  Hemoglobin A1C  Hemoglobin-A1c <5.7 % of total Hgb 8.1  8.9      Assessment/Plan: 1. Ingrown toenail of right foot   2. Pain due to onychomycosis of toenails of both feet   3. Type 2 diabetes mellitus without complication, unspecified whether long term insulin use (HCC)      Meds ordered this encounter  Medications   acetaminophen-codeine (TYLENOL #3) 300-30 MG tablet    Sig: Take 1 tablet by mouth every 6 (six) hours as needed for up to 20 doses for moderate pain (pain score 4-6).    Dispense:  20 tablet    Refill:  0    Discussed patient's condition today.  After obtaining patient consent, the right first toe was anesthetized with a 50:50 mixture of 1% lidocaine plain and 0.5% bupivacaine plain for a total of 3cc's administered.  Upon confirmation of anesthesia, a freer elevator was utilized to free the medial nail border from the nail bed.  The nail border was then avulsed proximal to the eponychium and removed in toto.  The area was inspected for any remaining spicules.  A chemical matrixectomy was performed with phenol and neutralized with isopropyl alcohol solution.  Antibiotic ointment and a DSD were applied, followed by a Coban dressing.  Patient tolerated the anesthetic and procedure well and will f/u in 2-3 weeks for recheck.  Patient given post-procedure instructions for daily 15-minute Epsom salt soaks, antibiotic ointment and daily use of Bandaids until toe starts to dry / form eschar.   # Onychomycosis Toenails x 10 were debrided in thickness and length using sterile nail nippers without incident Patient will return in 2 weeks for nail check with nail polish removed to discuss need for any potential pharmacological management  Patient educated on  diabetes. Discussed proper diabetic foot care and discussed risks and complications of disease. Educated patient in depth on reasons to return to the office immediately should he/she discover anything concerning or new on the feet. All questions answered. Discussed proper shoes as well.     Return in about 2 weeks (around 08/11/2023) for Nail Check, also determine need for antifungal med for other nails.   Bronwen Betters, DPM, AACFAS [facility]     [address] [place], [place] [address]                Office [phone]  Fax [phone]

## 2023-07-29 NOTE — Patient Instructions (Addendum)

## 2023-08-01 ENCOUNTER — Encounter (HOSPITAL_COMMUNITY): Payer: Self-pay

## 2023-08-01 ENCOUNTER — Other Ambulatory Visit: Payer: Self-pay

## 2023-08-01 ENCOUNTER — Emergency Department (HOSPITAL_COMMUNITY)
Admission: EM | Admit: 2023-08-01 | Discharge: 2023-08-02 | Discharge status: Against Medical Advice | Payer: Commercial Managed Care - HMO | Attending: Emergency Medicine | Admitting: Emergency Medicine

## 2023-08-01 DIAGNOSIS — R519 Headache, unspecified: Secondary | ICD-10-CM | POA: Insufficient documentation

## 2023-08-01 DIAGNOSIS — Z5321 Procedure and treatment not carried out due to patient leaving prior to being seen by health care provider: Secondary | ICD-10-CM | POA: Insufficient documentation

## 2023-08-01 DIAGNOSIS — R059 Cough, unspecified: Secondary | ICD-10-CM | POA: Diagnosis not present

## 2023-08-01 DIAGNOSIS — R072 Precordial pain: Secondary | ICD-10-CM | POA: Diagnosis present

## 2023-08-01 NOTE — ED Triage Notes (Signed)
Pt c/o non radiating midsternal chest pain and HA started yesterday. Pt c/o dry coughx3d.

## 2023-08-01 NOTE — ED Notes (Signed)
Pt has not responded to X-ray and Vital signs called. And was called several times by both.

## 2023-08-01 NOTE — ED Provider Triage Note (Signed)
Emergency Medicine Provider Triage Evaluation Note  [name] , a 47 y.o. female  was evaluated in triage.  Pt complains of chest pain and headache w/cold/flu symptoms x4 days.  Review of Systems  Positive: Headache, cough Negative: fevers  Physical Exam  BP (!) 165/98 (BP Location: Right Arm)   Pulse 86   Temp 98.9 F (37.2 C)   Resp 18   Ht 5\' 3"  (1.6 m)   Wt 74.3 kg   SpO2 100%   BMI 29.02 kg/m  Gen:   Awake, no distress   Resp:  Normal effort  MSK:   Moves extremities without difficulty    Medical Decision Making  Medically screening exam initiated at 5:17 PM.  Appropriate orders placed.  [name] was informed that the remainder of the evaluation will be completed by another provider, this initial triage assessment does not replace that evaluation, and the importance of remaining in the ED until their evaluation is complete.    Pete Pelt, [place] 08/01/23 [phone]

## 2023-08-04 ENCOUNTER — Ambulatory Visit: Payer: Commercial Managed Care - HMO | Admitting: Gastroenterology

## 2023-08-04 NOTE — Progress Notes (Deleted)
 HPI : [name] is a 48 y.o. female with diabetes, bipolar disorder, hidradenitis, tobacco abuse and depression who is referred to us  by Theophilus Andrews, Estel*for further evaluation of iron deficiency anemia.  Review of the patient's labs show that she has had a microcytosis for at least 9 years, with MCV 70.  She has had an anemia since 2022, with hemoglobin 11 in August 2022, most recently 10.  An iron panel from August of this year showed severe iron deficiency, with ferritin 5, saturation 8% and serum iron level 36.  She does not have any history of overt GI bleeding.  She does have a history of heavy menses.  She underwent a hysteroscopy and labial cyst removal by gynecology in October this year.  A myomectomy was performed.     Past Medical History:  Diagnosis Date   Back pain    Bipolar 1 disorder (HCC)    Bronchitis    Depressive disorder, not elsewhere classified    DM (diabetes mellitus), type 2 (HCC)    Gestational diabetes    Hidradenitis suppurativa    Obesity    Tobacco abuse      Past Surgical History:  Procedure Laterality Date   HYSTEROSCOPY WITH D & C N/A 05/23/2023   Procedure: DILATATION AND CURETTAGE /HYSTEROSCOPY MYOSURE, LABIAL CYST REMOVAL;  Surgeon: Jeralyn Crutch, MD;  Location: [facility];  Service: Gynecology;  Laterality: N/A;   TUBAL LIGATION     Family History  Problem Relation Age of Onset   Hypertension Mother    Schizophrenia Mother    Hypertension Father    Diabetes Father    Social History   Tobacco Use   Smoking status: Every Day    Current packs/day: 1.00    Types: Cigarettes   Smokeless tobacco: Never  Substance Use Topics   Alcohol use: Yes    Alcohol/week: 2.0 standard drinks of alcohol    Types: 2 Shots of liquor per week   Drug use: Yes    Types: Marijuana    Comment: last marijuana use daily last dose 05/22/2023   Current Outpatient Medications  Medication Sig Dispense Refill   acetaminophen   (TYLENOL ) 500 MG tablet Take 500 mg by mouth every 6 (six) hours as needed.     acetaminophen -codeine  (TYLENOL  #3) 300-30 MG tablet Take 1 tablet by mouth every 6 (six) hours as needed for up to 20 doses for moderate pain (pain score 4-6). 20 tablet 0   albuterol  (VENTOLIN  HFA) 108 (90 Base) MCG/ACT inhaler Inhale 1-2 puffs into the lungs every 4 (four) hours as needed for wheezing or shortness of breath. 1 each 0   atorvastatin  (LIPITOR) 40 MG tablet Take 40 mg by mouth daily.     blood glucose meter kit and supplies KIT Dispense based on patient and insurance preference. Use up to four times daily as directed. 1 each 0   Blood Glucose Monitoring Suppl DEVI 1 each by Does not apply route in the morning, at noon, and at bedtime. May substitute to any manufacturer covered by patient's insurance. 1 each 0   Continuous Glucose Sensor (DEXCOM G7 SENSOR) MISC 1 Device by Does not apply route continuous. 9 each 0   ferrous sulfate 325 (65 FE) MG tablet Take 325 mg by mouth daily with breakfast.     Glucose Blood (BLOOD GLUCOSE TEST STRIPS) STRP 1 each by In Vitro route in the morning, at noon, and at bedtime. May substitute to any manufacturer covered  by patient's insurance. 100 each 3   hydrOXYzine  (VISTARIL ) 25 MG capsule Take 1 capsule (25 mg total) by mouth 3 (three) times daily as needed for anxiety. 90 capsule 0   Insulin  Pen Needle (PEN NEEDLES) 31G X 8 MM MISC 1 each by Does not apply route 3 (three) times daily. 90 each 0   Lancet Device MISC 1 each by Does not apply route in the morning, at noon, and at bedtime. May substitute to any manufacturer covered by patient's insurance. 1 each 0   Lancets (ONETOUCH ULTRASOFT) lancets Use as instructed. 100 each 12   Lancets Misc. MISC 1 each by Does not apply route in the morning, at noon, and at bedtime. May substitute to any manufacturer covered by patient's insurance. 100 each 3   LATUDA  40 MG TABS tablet TAKE 1 TABLET(40 MG) BY MOUTH DAILY WITH  BREAKFAST 30 tablet 0   LEVEMIR  FLEXPEN 100 UNIT/ML FlexPen ADMINISTER 12 UNITS UNDER THE SKIN TWICE DAILY (Patient taking differently: Inject 12 Units into the skin daily.) 3 mL 2   lisinopril  (ZESTRIL ) 10 MG tablet Take 1 tablet (10 mg total) by mouth daily. 90 tablet 1   metFORMIN  (GLUCOPHAGE -XR) 500 MG 24 hr tablet Take 1 tablet (500 mg total) by mouth 2 (two) times daily with a meal. 120 tablet 2   naproxen  sodium (ALEVE ) 220 MG tablet Take 220 mg by mouth 2 (two) times daily as needed.     ondansetron  (ZOFRAN ) 4 MG tablet TAKE 1 TABLET(4 MG) BY MOUTH EVERY 8 HOURS AS NEEDED FOR NAUSEA OR VOMITING 12 tablet 0   oxyCODONE  (OXY IR/ROXICODONE ) 5 MG immediate release tablet Take 1 tablet (5 mg total) by mouth every 4 (four) hours as needed for severe pain (pain score 7-10) or breakthrough pain. 6 tablet 0   No current facility-administered medications for this visit.   No Known Allergies   Review of Systems: All systems reviewed and negative except where noted in HPI.    No results found.  Physical Exam: There were no vitals taken for this visit. Constitutional: Pleasant,well-developed, ***female in no acute distress. HEENT: Normocephalic and atraumatic. Conjunctivae are normal. No scleral icterus. Neck supple.  Cardiovascular: Normal rate, regular rhythm.  Pulmonary/chest: Effort normal and breath sounds normal. No wheezing, rales or rhonchi. Abdominal: Soft, nondistended, nontender. Bowel sounds active throughout. There are no masses palpable. No hepatomegaly. Extremities: no edema Lymphadenopathy: No cervical adenopathy noted. Neurological: Alert and oriented to person place and time. Skin: Skin is warm and dry. No rashes noted. Psychiatric: Normal mood and affect. Behavior is normal.  CBC    Component Value Date/Time   WBC 8.4 02/25/2023 0943   RBC 5.06 02/25/2023 0943   HGB 10.4 (L) 02/25/2023 0943   HCT 32.0 (L) 02/25/2023 0943   PLT 540.0 (H) 02/25/2023 0943   MCV  63.3 Repeated and verified X2. (L) 02/25/2023 0943   MCH 20.0 (L) 02/13/2023 1300   MCHC 32.6 02/25/2023 0943   RDW 20.3 (H) 02/25/2023 0943   LYMPHSABS 2.6 02/25/2023 0943   MONOABS 0.7 02/25/2023 0943   EOSABS 0.2 02/25/2023 0943   BASOSABS 0.1 02/25/2023 0943    CMP     Component Value Date/Time   NA 137 07/12/2023 1052   K 4.5 07/12/2023 1052   CL 106 07/12/2023 1052   CO2 24 07/12/2023 1052   GLUCOSE 154 (H) 07/12/2023 1052   BUN 14 07/12/2023 1052   CREATININE 0.83 07/12/2023 1052   CALCIUM  9.1 07/12/2023 1052  PROT 6.8 07/08/2023 0906   ALBUMIN 4.1 02/25/2023 0943   AST 13 07/08/2023 0906   ALT 8 07/08/2023 0906   ALKPHOS 52 02/25/2023 0943   BILITOT 0.2 07/08/2023 0906   GFRNONAA >60 02/13/2023 1300   GFRAA >60 02/22/2019 1424       Latest Ref Rng & Units 02/25/2023    9:43 AM 02/13/2023    1:00 PM 03/03/2021    5:07 PM  CBC EXTENDED  WBC 4.0 - 10.5 K/uL 8.4  13.1  8.2   RBC 3.87 - 5.11 Mil/uL 5.06  5.10  5.37   Hemoglobin 12.0 - 15.0 g/dL 89.5  89.7  88.5   HCT 36.0 - 46.0 % 32.0  31.8  34.2   Platelets 150.0 - 400.0 K/uL 540.0  548  547   NEUT# 1.4 - 7.7 K/uL 4.9     Lymph# 0.7 - 4.0 K/uL 2.6         ASSESSMENT AND PLAN:  Theophilus Andrews, Estel*

## 2023-08-08 NOTE — Telephone Encounter (Signed)
 Pharmacy Patient Advocate Encounter  Received notification from [employer] that Prior Authorization for Dexcom G7 sensor has been AAPROVED through 07/18/2024   PA #/Case ID/Reference #: [id number]

## 2023-08-11 ENCOUNTER — Ambulatory Visit: Payer: Commercial Managed Care - HMO | Admitting: Podiatry

## 2023-08-16 ENCOUNTER — Ambulatory Visit: Payer: Commercial Managed Care - HMO | Admitting: "Endocrinology

## 2023-09-07 ENCOUNTER — Other Ambulatory Visit: Payer: Self-pay | Admitting: Internal Medicine

## 2023-10-02 ENCOUNTER — Other Ambulatory Visit: Payer: Self-pay | Admitting: Family Medicine

## 2023-10-06 ENCOUNTER — Ambulatory Visit (HOSPITAL_COMMUNITY): Admission: EM | Admit: 2023-10-06 | Discharge: 2023-10-06 | Disposition: A | Discharge status: Home / Self Care

## 2023-10-06 DIAGNOSIS — Z91148 Patient's other noncompliance with medication regimen for other reason: Secondary | ICD-10-CM

## 2023-10-06 DIAGNOSIS — G4709 Other insomnia: Secondary | ICD-10-CM

## 2023-10-06 DIAGNOSIS — F1290 Cannabis use, unspecified, uncomplicated: Secondary | ICD-10-CM

## 2023-10-06 NOTE — Progress Notes (Signed)
   10/06/23 0353  [facility] Triage Screening (Walk-ins at [facility] only)  How Did You Hear About [place]? Self  What Is the Reason for Your Visit/Call Today? Pt presents to [facility] as a voluntary walk-in, unaccompanied with complaint of paranoia and inability to sleep or eat. Pt reports that she is unable to function at work and has recently had to cut back on her hours because of her mental state. Pt reports that she is ruminating on situations and feel that she is out of control. Pt reports diagnosis of Bipolar Disorder and is currently prescribed Latuda (40mg ). Pt reports last dose of Latuda taken yesterday. Pt currently denies SI,HI,AVH.  How Long Has This Been Causing You Problems? 1-6 months  Have You Recently Had Any Thoughts About Hurting Yourself? No  Are You Planning to Commit Suicide/Harm Yourself At This time? No  Have you Recently Had Thoughts About Hurting Someone Karolee Ohs? No  Are You Planning To Harm Someone At This Time? No  Physical Abuse Denies  Verbal Abuse Denies  Sexual Abuse Denies  Exploitation of patient/patient's resources Denies  Self-Neglect Denies  Are you currently experiencing any auditory, visual or other hallucinations? No  Have You Used Any Alcohol or Drugs in the Past 24 Hours? Yes  What Did You Use and How Much? marijuana (unknown amount)  Do you have any current medical co-morbidities that require immediate attention? No (pt is diabetic)  Clinician description of patient physical appearance/behavior: tearful at times, cooperative. Observed with a blank expression/stare  What Do You Feel Would Help You the Most Today? Treatment for Depression or other mood problem  If access to [facility] was not available, would you have sought care in the Emergency Department? No  Determination of Need Urgent (48 hours)  Options For Referral Other: Comment;Outpatient Therapy;Medication Management;BH Urgent Care  Determination of Need filed? Yes

## 2023-10-06 NOTE — ED Provider Notes (Signed)
 Behavioral Health Urgent Care Medical Screening Exam  Patient Name: [name] MRN: [id number] Date of Evaluation: 10/06/23 Chief Complaint:  can't eat or not getting enough sleep Diagnosis:  Final diagnoses:  Other insomnia  Marijuana use  Hx of medication noncompliance    History of Present illness: [name] is a 48 y.o. female. With a history of bipolar dx., insomnia, presented to [facility], voluntarily.  Per the patient she is not eating or sleeping good.  Patient stated she does take sleep aid and supplements such as melatonin hydroxyzine but it really does not help.  According to the patient she is currently not seeing a psychiatrist or therapist and her primary care physician is writing her medicines for her bipolar disorder she is currently prescribed Latuda hydroxyzine but according to patient she only takes it every now and then.  Patient lives with family, currently employed.  And according to the patient she was hospitalized back in 2019 for bipolar disorder.  A face-to-face evaluation of patient, patient is alert and oriented x 4, speech is clear, maintaining eye contact.  Patient does appear to be anxious however patient answer questions appropriately.  Patient denies SI, HI, AVH.  Patient does state that she is paranoid when asked what Lorin Picket is seeing the paranoia she says her job but she did not go into details.  Patient stated that she also drinks alcohol every other day she will have a shot of liquor.  Patient reports she smoked marijuana on a regular.  Patient denies wanting to hurting herself or others denies access to guns at this time.  Patient stated she just need some help so she can get some good sleep.  Writer offered patient the option for admission however patient stated she could not stay because she has to go to work in a few hours.  However patient stated that she would come back probably Friday after she is off and try to get admitted for the weekend. Patient  was educated and advised to return to the clinic or call 911 should she experience any suicidal ideation/homicidal thoughts or hallucination.  Patient verbalized understanding.  Before patient was discharged patient was given the option of second time to be admitted however she stated that she has to go to work and she would come back when she is off.  At this time patient does not pose an immediate threat to herself does not seem to be in any immediate distress.     Recommend discharge as patient is requested and for patient to follow up with the clinic.  Flowsheet Row ED from 10/06/2023 in [facility] ED from 08/01/2023 in [facility] Emergency Department at [facility] Admission (Discharged) from 05/23/2023 in [facility]  C-SSRS RISK CATEGORY No Risk No Risk No Risk       Psychiatric Specialty Exam  Presentation  General Appearance:Casual  Eye Contact:Good  Speech:Clear and Coherent  Speech Volume:Normal  Handedness:Right   Mood and Affect  Mood: Anxious  Affect: Congruent   Thought Process  Thought Processes: Coherent  Descriptions of Associations:Intact  Orientation:Full (Time, Place and Person)  Thought Content:WDL    Hallucinations:None  Ideas of Reference:Paranoia  Suicidal Thoughts:No  Homicidal Thoughts:No   Sensorium  Memory: Immediate Fair  Judgment: Fair  Insight: Fair   Art therapist  Concentration: Fair  Attention Span: Fair  Recall: Good  Fund of Knowledge: Good  Language: Good   Psychomotor Activity  Psychomotor Activity: Normal   Assets  Assets: Communication Skills   Sleep  Sleep: Poor  Number of hours:  3   Physical Exam: Physical Exam HENT:     Head: Normocephalic.     Nose: Nose normal.  Eyes:     Pupils: Pupils are equal, round, and reactive to light.  Pulmonary:     Effort: Pulmonary effort is normal.  Musculoskeletal:        General: Normal  range of motion.     Cervical back: Normal range of motion.  Neurological:     General: No focal deficit present.     Mental Status: She is alert.  Psychiatric:        Mood and Affect: Mood normal.    Review of Systems  Constitutional: Negative.   HENT: Negative.    Eyes: Negative.   Respiratory: Negative.    Cardiovascular: Negative.   Gastrointestinal: Negative.   Genitourinary: Negative.   Musculoskeletal: Negative.   Skin: Negative.   Neurological: Negative.   Psychiatric/Behavioral:  Positive for substance abuse. The patient is nervous/anxious and has insomnia.    Blood pressure (!) 169/84, pulse 77, temperature 98 F (36.7 C), temperature source Oral, resp. rate 20, SpO2 98%. There is no height or weight on file to calculate BMI.  Musculoskeletal: Strength & Muscle Tone: within normal limits Gait & Station: normal Patient leans: N/A   [facility] MSE Discharge Disposition for Follow up and Recommendations: Based on my evaluation the patient does not appear to have an emergency medical condition and can be discharged with resources and follow up care in outpatient services for Medication Management and Individual Therapy   Sindy Guadeloupe, NP 10/06/2023, 5:42 AM

## 2023-10-06 NOTE — Discharge Instructions (Addendum)
 F/u with PCP.

## 2023-10-10 ENCOUNTER — Ambulatory Visit (HOSPITAL_COMMUNITY): Payer: MEDICAID | Admitting: Licensed Clinical Social Worker

## 2023-10-10 ENCOUNTER — Encounter (HOSPITAL_COMMUNITY): Payer: Self-pay

## 2023-10-12 ENCOUNTER — Other Ambulatory Visit: Payer: Self-pay

## 2023-10-12 ENCOUNTER — Emergency Department (HOSPITAL_COMMUNITY)
Admission: EM | Admit: 2023-10-12 | Discharge: 2023-10-13 | Disposition: A | Discharge status: Other Acute Inpatient Hospital | Payer: MEDICAID | Attending: Emergency Medicine | Admitting: Emergency Medicine

## 2023-10-12 ENCOUNTER — Encounter (HOSPITAL_COMMUNITY): Payer: Self-pay

## 2023-10-12 DIAGNOSIS — I10 Essential (primary) hypertension: Secondary | ICD-10-CM | POA: Insufficient documentation

## 2023-10-12 DIAGNOSIS — F1210 Cannabis abuse, uncomplicated: Secondary | ICD-10-CM | POA: Diagnosis not present

## 2023-10-12 DIAGNOSIS — Z7984 Long term (current) use of oral hypoglycemic drugs: Secondary | ICD-10-CM | POA: Diagnosis not present

## 2023-10-12 DIAGNOSIS — E876 Hypokalemia: Secondary | ICD-10-CM | POA: Diagnosis not present

## 2023-10-12 DIAGNOSIS — Z720 Tobacco use: Secondary | ICD-10-CM | POA: Diagnosis not present

## 2023-10-12 DIAGNOSIS — Y900 Blood alcohol level of less than 20 mg/100 ml: Secondary | ICD-10-CM | POA: Diagnosis not present

## 2023-10-12 DIAGNOSIS — R000 Tachycardia, unspecified: Secondary | ICD-10-CM | POA: Diagnosis not present

## 2023-10-12 DIAGNOSIS — G4700 Insomnia, unspecified: Secondary | ICD-10-CM | POA: Diagnosis not present

## 2023-10-12 DIAGNOSIS — F22 Delusional disorders: Secondary | ICD-10-CM | POA: Insufficient documentation

## 2023-10-12 DIAGNOSIS — Z794 Long term (current) use of insulin: Secondary | ICD-10-CM | POA: Insufficient documentation

## 2023-10-12 DIAGNOSIS — E1165 Type 2 diabetes mellitus with hyperglycemia: Secondary | ICD-10-CM | POA: Insufficient documentation

## 2023-10-12 DIAGNOSIS — F29 Unspecified psychosis not due to a substance or known physiological condition: Secondary | ICD-10-CM | POA: Diagnosis not present

## 2023-10-12 DIAGNOSIS — F319 Bipolar disorder, unspecified: Secondary | ICD-10-CM | POA: Diagnosis present

## 2023-10-12 DIAGNOSIS — Z79899 Other long term (current) drug therapy: Secondary | ICD-10-CM | POA: Insufficient documentation

## 2023-10-12 DIAGNOSIS — F309 Manic episode, unspecified: Secondary | ICD-10-CM

## 2023-10-12 LAB — COMPREHENSIVE METABOLIC PANEL
ALT: 18 U/L (ref 0–44)
AST: 28 U/L (ref 15–41)
Albumin: 4.3 g/dL (ref 3.5–5.0)
Alkaline Phosphatase: 60 U/L (ref 38–126)
Anion gap: 11 (ref 5–15)
BUN: 13 mg/dL (ref 6–20)
CO2: 25 mmol/L (ref 22–32)
Calcium: 9.4 mg/dL (ref 8.9–10.3)
Chloride: 99 mmol/L (ref 98–111)
Creatinine, Ser: 0.94 mg/dL (ref 0.44–1.00)
GFR, Estimated: >60 mL/min (ref >60)
Glucose, Bld: 197 mg/dL — ABNORMAL HIGH (ref 70–99)
Potassium: 3.2 mmol/L — ABNORMAL LOW (ref 3.5–5.1)
Sodium: 135 mmol/L (ref 135–145)
Total Bilirubin: 0.7 mg/dL (ref 0.0–1.2)
Total Protein: 8.5 g/dL — ABNORMAL HIGH (ref 6.5–8.1)

## 2023-10-12 LAB — RAPID URINE DRUG SCREEN, HOSP PERFORMED
Amphetamines: NOT DETECTED
Barbiturates: NOT DETECTED
Benzodiazepines: NOT DETECTED
Cocaine: NOT DETECTED
Opiates: NOT DETECTED
Tetrahydrocannabinol: POSITIVE — AB

## 2023-10-12 LAB — URINALYSIS, ROUTINE W REFLEX MICROSCOPIC
Bacteria, UA: NONE SEEN
Bilirubin Urine: NEGATIVE
Glucose, UA: 50 mg/dL — AB
Ketones, ur: 5 mg/dL — AB
Leukocytes,Ua: NEGATIVE
Nitrite: NEGATIVE
Protein, ur: >=300 mg/dL — AB
Specific Gravity, Urine: 1.025 (ref 1.005–1.030)
pH: 5 (ref 5.0–8.0)

## 2023-10-12 LAB — CBC
HCT: 36.3 % (ref 36.0–46.0)
Hemoglobin: 12.3 g/dL (ref 12.0–15.0)
MCH: 22.1 pg — ABNORMAL LOW (ref 26.0–34.0)
MCHC: 33.9 g/dL (ref 30.0–36.0)
MCV: 65.3 fL — ABNORMAL LOW (ref 80.0–100.0)
Platelets: 548 10*3/uL — ABNORMAL HIGH (ref 150–400)
RBC: 5.56 MIL/uL — ABNORMAL HIGH (ref 3.87–5.11)
RDW: 21.1 % — ABNORMAL HIGH (ref 11.5–15.5)
WBC: 8 10*3/uL (ref 4.0–10.5)
nRBC: 0 % (ref 0.0–0.2)

## 2023-10-12 LAB — CBG MONITORING, ED: Glucose-Capillary: 225 mg/dL — ABNORMAL HIGH (ref 70–99)

## 2023-10-12 LAB — SALICYLATE LEVEL: Salicylate Lvl: <7 mg/dL — ABNORMAL LOW (ref 7.0–30.0)

## 2023-10-12 LAB — HCG, SERUM, QUALITATIVE: Preg, Serum: NEGATIVE

## 2023-10-12 LAB — ETHANOL: Alcohol, Ethyl (B): <10 mg/dL (ref <10)

## 2023-10-12 LAB — ACETAMINOPHEN LEVEL: Acetaminophen (Tylenol), Serum: <10 ug/mL — ABNORMAL LOW (ref 10–30)

## 2023-10-12 MED ORDER — INSULIN GLARGINE 100 UNIT/ML ~~LOC~~ SOLN
12.0000 [IU] | Freq: Every day | SUBCUTANEOUS | Status: DC
Start: 2023-10-12 — End: 2023-10-13
  Administered 2023-10-13: 12 [IU] via SUBCUTANEOUS
  Filled 2023-10-12 (×2): qty 0.12

## 2023-10-12 MED ORDER — POTASSIUM CHLORIDE CRYS ER 20 MEQ PO TBCR
40.0000 meq | EXTENDED_RELEASE_TABLET | Freq: Once | ORAL | Status: DC
  Filled 2023-10-12: qty 2

## 2023-10-12 MED ORDER — METFORMIN HCL ER 500 MG PO TB24
500.0000 mg | ORAL_TABLET | Freq: Two times a day (BID) | ORAL | Status: DC
  Administered 2023-10-13: 500 mg via ORAL
  Filled 2023-10-12 (×3): qty 1

## 2023-10-12 MED ORDER — LORAZEPAM 1 MG PO TABS
1.0000 mg | ORAL_TABLET | ORAL | Status: DC | PRN
  Filled 2023-10-12: qty 1

## 2023-10-12 MED ORDER — STERILE WATER FOR INJECTION IJ SOLN
INTRAMUSCULAR | Status: AC
  Filled 2023-10-12: qty 10

## 2023-10-12 MED ORDER — ATORVASTATIN CALCIUM 40 MG PO TABS
40.0000 mg | ORAL_TABLET | Freq: Every day | ORAL | Status: DC
  Administered 2023-10-13: 40 mg via ORAL
  Filled 2023-10-12 (×2): qty 1

## 2023-10-12 MED ORDER — LURASIDONE HCL 40 MG PO TABS: 40.0000 mg | ORAL_TABLET | Freq: Every day | ORAL | Status: DC

## 2023-10-12 MED ORDER — ACETAMINOPHEN 500 MG PO TABS
500.0000 mg | ORAL_TABLET | Freq: Four times a day (QID) | ORAL | Status: DC | PRN
  Administered 2023-10-12: 500 mg via ORAL
  Filled 2023-10-12: qty 1

## 2023-10-12 MED ORDER — HYDROXYZINE HCL 10 MG PO TABS
25.0000 mg | ORAL_TABLET | Freq: Three times a day (TID) | ORAL | Status: DC | PRN
  Administered 2023-10-13: 25 mg via ORAL
  Filled 2023-10-12: qty 3

## 2023-10-12 MED ORDER — OLANZAPINE 10 MG PO TBDP
10.0000 mg | ORAL_TABLET | Freq: Three times a day (TID) | ORAL | Status: DC | PRN
  Filled 2023-10-12 (×2): qty 1

## 2023-10-12 MED ORDER — ZIPRASIDONE MESYLATE 20 MG IM SOLR
20.0000 mg | INTRAMUSCULAR | Status: AC | PRN
  Administered 2023-10-12: 20 mg via INTRAMUSCULAR
  Filled 2023-10-12: qty 20

## 2023-10-12 MED ORDER — ZIPRASIDONE MESYLATE 20 MG IM SOLR
20.0000 mg | Freq: Once | INTRAMUSCULAR | Status: AC
  Administered 2023-10-12: 20 mg via INTRAMUSCULAR
  Filled 2023-10-12: qty 20

## 2023-10-12 MED ORDER — LISINOPRIL 10 MG PO TABS
10.0000 mg | ORAL_TABLET | Freq: Every day | ORAL | Status: DC
  Administered 2023-10-13: 10 mg via ORAL
  Filled 2023-10-12 (×2): qty 1

## 2023-10-12 MED ORDER — INSULIN DETEMIR 100 UNIT/ML FLEXPEN: 12.0000 [IU] | PEN_INJECTOR | Freq: Every day | SUBCUTANEOUS | Status: DC

## 2023-10-12 MED ORDER — ALBUTEROL SULFATE HFA 108 (90 BASE) MCG/ACT IN AERS: 1.0000 | INHALATION_SPRAY | RESPIRATORY_TRACT | Status: DC | PRN

## 2023-10-12 MED ORDER — DIVALPROEX SODIUM ER 500 MG PO TB24
500.0000 mg | ORAL_TABLET | Freq: Every day | ORAL | Status: DC
  Filled 2023-10-12: qty 1

## 2023-10-12 MED ORDER — ALBUTEROL SULFATE (2.5 MG/3ML) 0.083% IN NEBU: 2.5000 mg | INHALATION_SOLUTION | RESPIRATORY_TRACT | Status: DC | PRN

## 2023-10-12 MED ORDER — STERILE WATER FOR INJECTION IJ SOLN
INTRAMUSCULAR | Status: AC
  Administered 2023-10-12: 1.2 mL via INTRAMUSCULAR
  Filled 2023-10-12: qty 10

## 2023-10-12 MED ORDER — ARIPIPRAZOLE 5 MG PO TABS
5.0000 mg | ORAL_TABLET | Freq: Every day | ORAL | Status: DC
  Administered 2023-10-12: 5 mg via ORAL
  Filled 2023-10-12: qty 1

## 2023-10-12 NOTE — ED Notes (Addendum)
 Security and Research officer, trade [employer] assisted with IM medication administration. Pt complained of pain in her shoulder after the shot, and PRN tylenol given for such. Pt continued to pace but became less boisterous.

## 2023-10-12 NOTE — ED Provider Notes (Signed)
 [place] [facility] AT [facility] Provider Note   CSN: [id number] Arrival date & time: 10/12/23  1244     History  Chief Complaint  Patient presents with  . Psychiatric Evaluation    [name] is a 48 y.o. female.  Patient is a 48 year old female with a past medical history of hypertension, diabetes and bipolar disorder presenting to the emergency department requesting a psychiatry evaluation.  The patient states that she has not been able to sleep for the past 5 days.  She states that she has been feeling more anxious recently.  She states she occasionally naps but has not been able to get good rest and feels like she needs help with her mental health.  She states that she does not feel like her home medications are working for her.  She denies any SI, HI or hallucinations.  Her mother reports that she has been more agitated and getting into fights with people recently.  Patient otherwise denies any physical complaints.  The history is provided by the patient and a relative.       Home Medications Prior to Admission medications   Medication Sig Start Date End Date Taking? Authorizing Provider  acetaminophen (TYLENOL) 500 MG tablet Take 500 mg by mouth every 6 (six) hours as needed.    [provider]  acetaminophen-codeine (TYLENOL #3) 300-30 MG tablet Take 1 tablet by mouth every 6 (six) hours as needed for up to 20 doses for moderate pain (pain score 4-6). 07/28/23   Barbaraann Share, DPM  albuterol (VENTOLIN HFA) 108 (90 Base) MCG/ACT inhaler Inhale 1-2 puffs into the lungs every 4 (four) hours as needed for wheezing or shortness of breath. 05/04/23   Nelwyn Salisbury, MD  atorvastatin (LIPITOR) 40 MG tablet TAKE 1 TABLET(40 MG) BY MOUTH DAILY 09/07/23   Philip Aspen, Limmie Patricia, MD  blood glucose meter kit and supplies KIT Dispense based on patient and insurance preference. Use up to four times daily as directed. 02/22/21   Rushie Chestnut, PA-C   Blood Glucose Monitoring Suppl DEVI 1 each by Does not apply route in the morning, at noon, and at bedtime. May substitute to any manufacturer covered by patient's insurance. 07/12/23   Motwani, Carin Hock, MD  Continuous Glucose Sensor (DEXCOM G7 SENSOR) MISC 1 Device by Does not apply route continuous. 07/12/23   Motwani, Carin Hock, MD  ferrous sulfate 325 (65 FE) MG tablet Take 325 mg by mouth daily with breakfast.    [provider]  Glucose Blood (BLOOD GLUCOSE TEST STRIPS) STRP 1 each by In Vitro route in the morning, at noon, and at bedtime. May substitute to any manufacturer covered by patient's insurance. 07/12/23 11/22/23  Altamese Shackelford, MD  hydrOXYzine (VISTARIL) 25 MG capsule TAKE 1 CAPSULE(25 MG) BY MOUTH THREE TIMES DAILY AS NEEDED FOR ANXIETY 10/04/23   Philip Aspen, Limmie Patricia, MD  Insulin Pen Needle (PEN NEEDLES) 31G X 8 MM MISC 1 each by Does not apply route 3 (three) times daily. 01/17/21   Rushie Chestnut, PA-C  Lancets [facility] ULTRASOFT) lancets Use as instructed. 06/18/14   Charlane Ferretti, MD  LATUDA 40 MG TABS tablet TAKE 1 TABLET(40 MG) BY MOUTH DAILY WITH BREAKFAST 06/02/23   Philip Aspen, Limmie Patricia, MD  LEVEMIR FLEXPEN 100 UNIT/ML FlexPen ADMINISTER 12 UNITS UNDER THE SKIN TWICE DAILY Patient taking differently: Inject 12 Units into the skin daily. 06/20/23   Philip Aspen, Limmie Patricia, MD  lisinopril (ZESTRIL)  10 MG tablet Take 1 tablet (10 mg total) by mouth daily. 03/03/23   Philip Aspen, Limmie Patricia, MD  metFORMIN (GLUCOPHAGE-XR) 500 MG 24 hr tablet Take 1 tablet (500 mg total) by mouth 2 (two) times daily with a meal. 07/19/23   Motwani, Komal, MD  naproxen sodium (ALEVE) 220 MG tablet Take 220 mg by mouth 2 (two) times daily as needed.    [provider]  ondansetron (ZOFRAN) 4 MG tablet TAKE 1 TABLET(4 MG) BY MOUTH EVERY 8 HOURS AS NEEDED FOR NAUSEA OR VOMITING 04/12/23   Philip Aspen, Limmie Patricia, MD  oxyCODONE (OXY IR/ROXICODONE) 5 MG immediate  release tablet Take 1 tablet (5 mg total) by mouth every 4 (four) hours as needed for severe pain (pain score 7-10) or breakthrough pain. 05/23/23   Lorriane Shire, MD  glipiZIDE (GLUCOTROL) 10 MG tablet Take 1 tablet (10 mg total) by mouth 2 (two) times daily before a meal. Patient not taking: Reported on 02/22/2019 07/26/17 12/23/19  Derwood Kaplan, MD  lamoTRIgine (LAMICTAL) 200 MG tablet Take 1 tablet (200 mg total) by mouth daily. Patient not taking: Reported on 02/22/2019 08/16/15 12/23/19  Oneta Rack, NP  risperiDONE (RISPERDAL M-TABS) 3 MG disintegrating tablet Take 1 tablet (3 mg total) by mouth at bedtime. Patient not taking: Reported on 10/25/2015 08/16/15 12/23/19  Oneta Rack, NP      Allergies    Patient has no known allergies.    Review of Systems   Review of Systems  Physical Exam Updated Vital Signs BP (!) 170/80 (BP Location: Right Arm)   Pulse (!) 104   Temp 98.4 F (36.9 C) (Oral)   Resp 18   Ht 5\' 3"  (1.6 m)   Wt 74.3 kg   SpO2 100%   BMI 29.02 kg/m  Physical Exam Vitals and nursing note reviewed.  Constitutional:      General: She is not in acute distress.    Appearance: Normal appearance.  HENT:     Head: Normocephalic and atraumatic.     Nose: Nose normal.     Mouth/Throat:     Mouth: Mucous membranes are moist.     Pharynx: Oropharynx is clear.  Eyes:     Extraocular Movements: Extraocular movements intact.     Conjunctiva/sclera: Conjunctivae normal.  Cardiovascular:     Rate and Rhythm: Regular rhythm. Tachycardia present.     Heart sounds: Normal heart sounds.  Pulmonary:     Effort: Pulmonary effort is normal.     Breath sounds: Normal breath sounds.  Abdominal:     General: Abdomen is flat.     Palpations: Abdomen is soft.     Tenderness: There is no abdominal tenderness.  Musculoskeletal:        General: Normal range of motion.     Cervical back: Normal range of motion.  Skin:    General: Skin is warm and dry.  Neurological:      General: No focal deficit present.     Mental Status: She is alert and oriented to person, place, and time.  Psychiatric:        Mood and Affect: Mood normal.        Behavior: Behavior normal.     ED Results / Procedures / Treatments   Labs (all labs ordered are listed, but only abnormal results are displayed) Labs Reviewed  COMPREHENSIVE METABOLIC PANEL - Abnormal; Notable for the following components:      Result Value   Potassium 3.2 (*)  Glucose, Bld 197 (*)    Total Protein 8.5 (*)    All other components within normal limits  SALICYLATE LEVEL - Abnormal; Notable for the following components:   Salicylate Lvl <7.0 (*)    All other components within normal limits  ACETAMINOPHEN LEVEL - Abnormal; Notable for the following components:   Acetaminophen (Tylenol), Serum <10 (*)    All other components within normal limits  CBC - Abnormal; Notable for the following components:   RBC 5.56 (*)    MCV 65.3 (*)    MCH 22.1 (*)    RDW 21.1 (*)    Platelets 548 (*)    All other components within normal limits  RAPID URINE DRUG SCREEN, HOSP PERFORMED - Abnormal; Notable for the following components:   Tetrahydrocannabinol POSITIVE (*)    All other components within normal limits  URINALYSIS, ROUTINE W REFLEX MICROSCOPIC - Abnormal; Notable for the following components:   Color, Urine AMBER (*)    APPearance HAZY (*)    Glucose, UA 50 (*)    Hgb urine dipstick SMALL (*)    Ketones, ur 5 (*)    Protein, ur >=300 (*)    All other components within normal limits  ETHANOL  HCG, SERUM, QUALITATIVE    EKG None  Radiology No results found.  Procedures Procedures    Medications Ordered in ED Medications  potassium chloride SA (KLOR-CON M) CR tablet 40 mEq (has no administration in time range)  metFORMIN (GLUCOPHAGE-XR) 24 hr tablet 500 mg (has no administration in time range)  acetaminophen (TYLENOL) tablet 500 mg (has no administration in time range)  albuterol  (VENTOLIN HFA) 108 (90 Base) MCG/ACT inhaler 1-2 puff (has no administration in time range)  atorvastatin (LIPITOR) tablet 40 mg (has no administration in time range)  hydrOXYzine (VISTARIL) capsule 25 mg (has no administration in time range)  lurasidone (LATUDA) tablet 40 mg (has no administration in time range)  lisinopril (ZESTRIL) tablet 10 mg (has no administration in time range)  insulin detemir (LEVEMIR) FlexPen 12 Units (has no administration in time range)    ED Course/ Medical Decision Making/ A&P Clinical Course as of 10/12/23 1504  Wed Oct 12, 2023  1504 Patient became acute agitated and fighting with staff. Will be given Geodon and IVC will be filed. [VK]    Clinical Course User Index [VK] Rexford Maus, DO                                 Medical Decision Making This patient presents to the ED with chief complaint(s) of requesting psych eval with pertinent past medical history of bipolar disorder, HTN, DM which further complicates the presenting complaint. The complaint involves an extensive differential diagnosis and also carries with it a high risk of complications and morbidity.    The differential diagnosis includes no evidence of SI/HI, mania, psychosis   Additional history obtained: Additional history obtained from family Records reviewed [facility] records  ED Course and Reassessment: On patient's arrival she is mildly tachycardic and hypertensive otherwise in no acute distress.  She denies any SI, HI or hallucinations and does not appear to be acutely psychotic to undergo IVC.  She would like to be voluntary at this time.  She had labs performed for medical clearance which did show mild hypokalemia that has been repleted, otherwise without acute disease.  Patient will have TTS eval and will be given her home  medications.  Independent labs interpretation:  The following labs were independently interpreted: Mild hypokalemia otherwise within normal  range  Independent visualization of imaging: - N/A  Consultation: - Consulted or discussed management/test interpretation w/ external professional: TTS   Amount and/or Complexity of Data Reviewed Labs: ordered.  Risk OTC drugs. Prescription drug management.          Final Clinical Impression(s) / ED Diagnoses Final diagnoses:  None    Rx / DC Orders ED Discharge Orders     None         Rexford Maus, DO 10/12/23 1425

## 2023-10-12 NOTE — ED Notes (Signed)
 Attempted to administer 4pm PO medication, pt refused. Patient stated that we are going to have to give her a shot because she's not taking any of the medication. She then became very irate and began ripping all of the doors (mats) off of the doorways and laid them on the floor. Patient was given IM Geodon with security standing by. Patient screamed threats and profanities afterwards. She is now eating her dinner tray on her bed.

## 2023-10-12 NOTE — ED Notes (Signed)
 Pt requested to talk to her aunt to ask to let her dogs out. Aunt # dialed for pt, and pt given the phone.

## 2023-10-12 NOTE — ED Notes (Signed)
 Pt family called and asked for pt status. Family acquired of visiting hours and policies, and family provided such. Pt family requested to be informed with updates on pt status and any inpatient updates.

## 2023-10-12 NOTE — ED Notes (Signed)
 Pt presents as agitated. Pt continues to pace between her room and the nurses desk. Pt requested some paper and something to write with. Pt given paper and crayons. Pt repeatedly comes to the desk to yell at staff that she wants a pen. Pt informed that per policy she could not have a pen. Pt also yelling about having a visitor. Pt informed of visitor policies and informed that her aunt, who called and she had spoken to previously, had also been informed of such policies.

## 2023-10-12 NOTE — ED Notes (Signed)
 Pt changed into burgundy scrubs. Pt belonging's placed in 2 bags and given to aunt to take home. Pt wanded by security.

## 2023-10-12 NOTE — BH Assessment (Addendum)
 LCSW Progress Note:   Analysse Quinonez MRN: [id number]  10/12/2023 11:43 am  The patient has been accepted to [facility] on 10/14/2023 with a bed assignment to [address]. The patient meets inpatient criteria per Phebe Colla, NP. The attending physician will be Jannifer Franklin, MD. Report can be called to [phone]. The patient can arrive tomorrow after 8 AM.  However, the bed offer was rescinded. Per nursing staff Carolyne Littles, RN, at 6:18 PM, "[address] just called and said there was a mistake and they only have a female bed, not a female bed." Therefore, the patient was faxed out to the following alternative hospitals for consideration of placement.  Destination  Service Provider   Address Phone Fax Patient Preferred  [facility]   [address] [place] [address] [phone] --  [facility]   [address] [place] [address] [phone] --  [facility]   [address] [place], [place] [address] [phone] --  [facility]   [address] [place] [address] [phone] --  [facility]   [address], [place] [address] [phone] --  [facility]   [address] [place] [address] [phone] --  [facility]   [address] [place] [address] [phone] --  [facility] EFAX   [address] [place], [place] [phone] --  [facility]   [address] [place], [place] [address] [phone] --  [facility]   [address], [place] [address] [phone] --  [facility] Patient Placement   [facility] [place], [place] [phone] --

## 2023-10-12 NOTE — ED Notes (Addendum)
 Patient is becoming very worked up in the hallway, taking off her wig, yelling at staff members and being very loud. Tried to redirect patient, patient unable to be redirected at this time. Patient moved to [facility] for patient and staff safety. Patient willingly walked to [facility] without incident. Aunt took patient's belongings home.

## 2023-10-12 NOTE — ED Notes (Signed)
 Pt alternating between lying in room 35 and room 36 bed.

## 2023-10-12 NOTE — Progress Notes (Signed)
 LCSW Progress Note  [id number]   [name]  10/12/2023  3:35 PM  Description:   Inpatient Psychiatric Referral  Patient was recommended inpatient per Sindy Guadeloupe NP). There are no available beds at [facility], per [facility] RN. Patient was referred to the following out of network facilities:   [facility] Provider Address Phone Fax  [facility] [address], [place] [address] [phone]  [facility] [address] [place] [address] [phone]  [facility] [address] [place], [place] [address] [phone]  [facility] [address] [place] [address] [phone]  [facility] [address] [place] [address] [phone]  [facility] [address], [place] [address] [phone]  [facility] [address], [place] [address] [phone]  [facility] EFAX [address] [place], [place] [phone]  [facility] [address] [place] [address] [phone]  [facility] [address] [place] [address] [phone]  [facility] Patient Placement [facility], [place] [phone]      Situation ongoing, CSW to continue following and update chart as more information becomes available.      [place] Psalm Schappell MSW, LCSW  10/12/2023 3:35 PM

## 2023-10-12 NOTE — Consult Note (Signed)
 [facility] Psychiatric Consult Initial  Patient Name: .[name]  MRN: [id number]  DOB: 23-Oct-1975  Consult Order details:  Orders (From admission, onward)     Start     Ordered   10/12/23 1418  CONSULT TO CALL ACT TEAM       Ordering Provider: Rexford Maus, DO  Provider:  (Not yet assigned)  Question:  Reason for Consult?  Answer:  Psych consult   10/12/23 1418             Mode of Visit: In person    Psychiatry Consult Evaluation  Service Date: October 12, 2023 LOS:  LOS: 0 days  Chief Complaint "My period just went off"  Primary Psychiatric Diagnoses  Bipolar disorder  2.  Psychosis  Assessment  [name] is a 48 y.o. female admitted: Presented to the EDfor 10/12/2023 12:47 PM for brought in by aunt for psychiatric evaluation due to not sleeping for 5 days. She carries the psychiatric diagnoses of bipolar 1 disorder, psychosis and schizoaffective disorder and has a past medical history of  DM2, asthma, hidradenitis suppurativa, obesity and uterine fibroid.   Her current presentation of psychosis is most consistent with decompensated schizoaffective disorder. She meets criteria for inpatient psychiatric hospitalization based on acute psychosis.  Current outpatient psychotropic medications include ashwagandha and latuda and historically she has had a poor response to these medications. She was not compliant with medications prior to admission as evidenced by patient report that she does not take the medication they way they are prescribed. On initial examination, patient is disorganized and unable to answer questions appropriately.. Please see plan below for detailed recommendations.   Diagnoses:  Active Hospital problems: Principal Problem:   Psychosis (HCC) Active Problems:   Bipolar 1 disorder (HCC)    Plan   ## Psychiatric Medication Recommendations:  Start --Abilify 5mg  PO Q HS --Depakote ER 500mg  PO Q HS  ## Medical Decision Making Capacity:  Not specifically addressed in this encounter  ## Further Work-up:  -- most recent EKG on 08/02/2023 had QtC of 401; pending updated EKG -- Pertinent labwork reviewed earlier this admission includes: CBC, CMP, acetaminophen, alcohol, salicylate, pregnancy, UA, and UDS   ## Disposition:-- We recommend inpatient psychiatric hospitalization when medically cleared. Patient is under voluntary admission status at this time; please IVC if attempts to leave hospital.  ## Behavioral / Environmental: -Utilize compassion and acknowledge the patient's experiences while setting clear and realistic expectations for care.    ## Safety and Observation Level:  - Based on my clinical evaluation, I estimate the patient to be at low risk of self harm in the current setting. - At this time, we recommend  routine. This decision is based on my review of the chart including patient's history and current presentation, interview of the patient, mental status examination, and consideration of suicide risk including evaluating suicidal ideation, plan, intent, suicidal or self-harm behaviors, risk factors, and protective factors. This judgment is based on our ability to directly address suicide risk, implement suicide prevention strategies, and develop a safety plan while the patient is in the clinical setting. Please contact our team if there is a concern that risk level has changed.  CSSR Risk Category:C-SSRS RISK CATEGORY: No Risk  Suicide Risk Assessment: Patient has following modifiable risk factors for suicide: medication noncompliance, which we are addressing by recommending inpatient psychiatric hospitalization. Patient has following non-modifiable or demographic risk factors for suicide: psychiatric hospitalization Patient has the following protective factors against  suicide: Access to outpatient mental health care and Supportive family  Thank you for this consult request. Recommendations have been communicated to  the primary team.  We will continue to follow at this time.   Thomes Lolling, NP       History of Present Illness  Relevant Aspects of Hospital ED Course:  Admitted on 10/12/2023 for  brought in by aunt for psychiatric evaluation due to not sleeping for 5 days. She carries the psychiatric diagnoses of bipolar 1 disorder, psychosis and schizoaffective disorder and has a past medical history of  DM2, asthma, hidradenitis suppurativa, obesity and uterine fibroid.   Patient Report:  [name], is seen face to face by this provider, consulted with Dr. Enedina Finner; and chart reviewed on 10/12/23.  On evaluation [name] reports "Period just went off. I'm gonna regulate back."  Unsure what patient was responding to when she said "they'll find their mom."  When asked what she means, patient said "It's cold. I know mine. They know theirs."  She is seen pacing up and down the hall and into her room and back.  She will intermittently curse, laugh and say non sensical phrases. Patient is unable to sit down for assessment.  Patient says that she does not have suicidal or homicidal ideation and she denies hallucinations.  She is unable to provide much detail regarding her history.  Patient is acutely psychotic and manic and requires inpatient stabilization.     During evaluation [name] is pacing in the hall and in her room in moderate distress. She is alert & oriented x 3 and manic.  Her mood is agitated with congruent affect.  She has non sensical speech, and manic behavior.  Objectively there is evidence of psychosis/mania and delusional thinking. Pt does appear to be responding to internal stimuli.  Patient is unable to converse coherently.  She  denies suicidal/self-harm/homicidal ideation, psychosis, and paranoia.    Psych ROS:  Depression: endorses Anxiety:  endorses Mania (lifetime and current): yes, current Psychosis: (lifetime and current): yes, current   Review of Systems   Psychiatric/Behavioral:  The patient has insomnia.   All other systems reviewed and are negative.    Psychiatric and Social History  Psychiatric History:  Information collected from patient and chart review  Prev Dx/Sx: bipolar 1 disorder, psychosis and schizoaffective disorder Current Psych Provider: none Home Meds (current): ashwaganda and latuda (patient is taking 10mg  PRN) Previous Med Trials: unknown Therapy: none noted  Prior Psych Hospitalization: yes  Prior Self Harm: unknown Prior Violence: unknown  Family Psych History: unknown Family Hx suicide: unknown  Social History:  Developmental Hx: WNL Educational Hx: "89 credits at [employer] college" Occupational Hx: Works at [employer] Hx: none noted Living Situation: lives alone  Spiritual Hx: unknown Access to weapons/lethal means: denies   Substance History Alcohol: denies  Tobacco: unknown Illicit drugs: UDS + THC Prescription drug abuse: denies Rehab hx: denies  Exam Findings  Physical Exam:  Vital Signs:  Temp:  [98.4 F (36.9 C)] 98.4 F (36.9 C) (03/12 1252) Pulse Rate:  [104] 104 (03/12 1252) Resp:  [18] 18 (03/12 1252) BP: (170)/(80) 170/80 (03/12 1252) SpO2:  [100 %] 100 % (03/12 1252) Weight:  [74.3 kg] 74.3 kg (03/12 1411) Blood pressure (!) 170/80, pulse (!) 104, temperature 98.4 F (36.9 C), temperature source Oral, resp. rate 18, height 5\' 3"  (1.6 m), weight 74.3 kg, SpO2 100%. Body mass index is 29.02 kg/m.  Physical Exam Vitals  and nursing note reviewed.  Constitutional:      Appearance: She is obese.  Eyes:     Pupils: Pupils are equal, round, and reactive to light.  Pulmonary:     Effort: Pulmonary effort is normal.  Skin:    General: Skin is dry.  Neurological:     Mental Status: She is alert and oriented to person, place, and time.  Psychiatric:        Speech: Speech is rapid and pressured.        Behavior: Behavior is agitated.        Thought Content: Thought content  is delusional.        Judgment: Judgment is impulsive.     Mental Status Exam: General Appearance: Casual  Orientation:  Full (Time, Place, and Person)  Memory:  Immediate;   Unable to assess Recent;    Unable to assess Remote;    Unable to assess  Concentration:  Concentration:  Unable to assess  Recall:    Unable to assess  Attention  Poor  Eye Contact:  Fair  Speech:  Pressured  Language:  Good  Volume:  Normal  Mood: agitated  Affect:  Congruent  Thought Process:  Disorganized  Thought Content:  Delusions  Suicidal Thoughts:  No  Homicidal Thoughts:  No  Judgement:  Impaired  Insight:  Lacking  Psychomotor Activity:  Increased and Restlessness  Akathisia:  No  Fund of Knowledge:  Good      Assets:  Housing Leisure Time Social Support  Cognition:  WNL  ADL's:  Intact  AIMS (if indicated):        Other History   These have been pulled in through the EMR, reviewed, and updated if appropriate.  Family History:  The patient's family history includes Diabetes in her father; Hypertension in her father and mother; Schizophrenia in her mother.  Medical History: Past Medical History:  Diagnosis Date  . Back pain   . Bipolar 1 disorder (HCC)   . Bronchitis   . Depressive disorder, not elsewhere classified   . DM (diabetes mellitus), type 2 (HCC)   . Gestational diabetes   . Hidradenitis suppurativa   . Obesity   . Tobacco abuse     Surgical History: Past Surgical History:  Procedure Laterality Date  . HYSTEROSCOPY WITH D & C N/A 05/23/2023   Procedure: DILATATION AND CURETTAGE /HYSTEROSCOPY MYOSURE, LABIAL CYST REMOVAL;  Surgeon: Lorriane Shire, MD;  Location: [facility];  Service: Gynecology;  Laterality: N/A;  . TUBAL LIGATION       Medications:   Current Facility-Administered Medications:  .  acetaminophen (TYLENOL) tablet 500 mg, 500 mg, Oral, Q6H PRN, Theresia Lo, Victoria K, DO .  albuterol (PROVENTIL) (2.5 MG/3ML) 0.083%  nebulizer solution 2.5 mg, 2.5 mg, Nebulization, Q4H PRN, Kingsley, Victoria K, DO .  atorvastatin (LIPITOR) tablet 40 mg, 40 mg, Oral, Daily, Kingsley, Victoria K, DO .  hydrOXYzine (ATARAX) tablet 25 mg, 25 mg, Oral, TID PRN, Kingsley, Victoria K, DO .  insulin glargine (LANTUS) injection 12 Units, 12 Units, Subcutaneous, Daily, Kingsley, Victoria K, DO .  lisinopril (ZESTRIL) tablet 10 mg, 10 mg, Oral, Daily, Kingsley, Victoria K, DO .  OLANZapine zydis (ZYPREXA) disintegrating tablet 10 mg, 10 mg, Oral, Q8H PRN **AND** LORazepam (ATIVAN) tablet 1 mg, 1 mg, Oral, PRN **AND** ziprasidone (GEODON) injection 20 mg, 20 mg, Intramuscular, PRN, Kingsley, Victoria K, DO .  [START ON 10/13/2023] lurasidone (LATUDA) tablet 40 mg, 40 mg, Oral, Q breakfast, Theresia Lo,  Cecile Sheerer, DO .  metFORMIN (GLUCOPHAGE-XR) 24 hr tablet 500 mg, 500 mg, Oral, BID WC, Kingsley, Victoria K, DO .  potassium chloride SA (KLOR-CON M) CR tablet 40 mEq, 40 mEq, Oral, Once, Kingsley, Victoria K, DO  Current Outpatient Medications:  .  acetaminophen (TYLENOL) 500 MG tablet, Take 500 mg by mouth every 6 (six) hours as needed for mild pain (pain score 1-3) or headache., Disp: , Rfl:  .  albuterol (VENTOLIN HFA) 108 (90 Base) MCG/ACT inhaler, Inhale 1-2 puffs into the lungs every 4 (four) hours as needed for wheezing or shortness of breath., Disp: 1 each, Rfl: 0 .  ascorbic acid (VITAMIN C) 500 MG tablet, Take 500-1,000 mg by mouth daily., Disp: , Rfl:  .  ASHWAGANDHA PO, Take 1-2 capsules by mouth daily., Disp: , Rfl:  .  ferrous sulfate 325 (65 FE) MG tablet, Take 325 mg by mouth daily with breakfast., Disp: , Rfl:  .  hydrOXYzine (VISTARIL) 25 MG capsule, TAKE 1 CAPSULE(25 MG) BY MOUTH THREE TIMES DAILY AS NEEDED FOR ANXIETY, Disp: 90 capsule, Rfl: 0 .  ibuprofen (ADVIL) 200 MG tablet, Take 200-400 mg by mouth every 6 (six) hours as needed for mild pain (pain score 1-3) or headache., Disp: , Rfl:  .  LATUDA 40 MG TABS tablet, TAKE  1 TABLET(40 MG) BY MOUTH DAILY WITH BREAKFAST (Patient taking differently: Take 10 mg by mouth daily as needed (as determined by the patient).), Disp: 30 tablet, Rfl: 0 .  LEVEMIR FLEXPEN 100 UNIT/ML FlexPen, ADMINISTER 12 UNITS UNDER THE SKIN TWICE DAILY (Patient taking differently: Inject 12 Units into the skin daily.), Disp: 3 mL, Rfl: 2 .  olopatadine (EYE ALLERGY ITCH/REDNESS REL) 0.1 % ophthalmic solution, Place 1 drop into both eyes 2 (two) times daily as needed (FOR SEASONAL ALLERGIES)., Disp: , Rfl:  .  ondansetron (ZOFRAN) 4 MG tablet, TAKE 1 TABLET(4 MG) BY MOUTH EVERY 8 HOURS AS NEEDED FOR NAUSEA OR VOMITING, Disp: 12 tablet, Rfl: 0 .  acetaminophen-codeine (TYLENOL #3) 300-30 MG tablet, Take 1 tablet by mouth every 6 (six) hours as needed for up to 20 doses for moderate pain (pain score 4-6). (Patient not taking: Reported on 10/12/2023), Disp: 20 tablet, Rfl: 0 .  atorvastatin (LIPITOR) 40 MG tablet, TAKE 1 TABLET(40 MG) BY MOUTH DAILY (Patient not taking: Reported on 10/12/2023), Disp: 90 tablet, Rfl: 1 .  blood glucose meter kit and supplies KIT, Dispense based on patient and insurance preference. Use up to four times daily as directed., Disp: 1 each, Rfl: 0 .  Blood Glucose Monitoring Suppl DEVI, 1 each by Does not apply route in the morning, at noon, and at bedtime. May substitute to any manufacturer covered by patient's insurance., Disp: 1 each, Rfl: 0 .  Continuous Glucose Sensor (DEXCOM G7 SENSOR) MISC, 1 Device by Does not apply route continuous. (Patient not taking: Reported on 10/12/2023), Disp: 9 each, Rfl: 0 .  Glucose Blood (BLOOD GLUCOSE TEST STRIPS) STRP, 1 each by In Vitro route in the morning, at noon, and at bedtime. May substitute to any manufacturer covered by patient's insurance., Disp: 100 each, Rfl: 3 .  Insulin Pen Needle (PEN NEEDLES) 31G X 8 MM MISC, 1 each by Does not apply route 3 (three) times daily., Disp: 90 each, Rfl: 0 .  Lancets (ONETOUCH ULTRASOFT) lancets, Use  as instructed., Disp: 100 each, Rfl: 12 .  lisinopril (ZESTRIL) 10 MG tablet, Take 1 tablet (10 mg total) by mouth daily. (Patient not taking:  Reported on 10/12/2023), Disp: 90 tablet, Rfl: 1 .  metFORMIN (GLUCOPHAGE-XR) 500 MG 24 hr tablet, Take 1 tablet (500 mg total) by mouth 2 (two) times daily with a meal. (Patient not taking: Reported on 10/12/2023), Disp: 120 tablet, Rfl: 2 .  oxyCODONE (OXY IR/ROXICODONE) 5 MG immediate release tablet, Take 1 tablet (5 mg total) by mouth every 4 (four) hours as needed for severe pain (pain score 7-10) or breakthrough pain. (Patient not taking: Reported on 10/12/2023), Disp: 6 tablet, Rfl: 0  Allergies: Allergies  Allergen Reactions  . Metformin And Related Nausea Only  . Other Other (See Comments)    Patient prefers to NOT be prescribed: Ozempic, Melatonin, or Trazodone    Thomes Lolling, NP

## 2023-10-12 NOTE — ED Provider Triage Note (Signed)
 Emergency Medicine Provider Triage Evaluation Note  [name] , a 48 y.o. female  was evaluated in triage.  Pt complains of AVH.  Review of Systems  Positive: AVH Negative: SI/HI, fever, CP, SHOB  Physical Exam  BP (!) 170/80 (BP Location: Right Arm)   Pulse (!) 104   Temp 98.4 F (36.9 C) (Oral)   Resp 18   SpO2 100%  Gen:   Awake, no distress   Resp:  Normal effort  MSK:   Moves extremities without difficulty  Other:    Medical Decision Making  Medically screening exam initiated at 1:02 PM.  Appropriate orders placed.  [name] was informed that the remainder of the evaluation will be completed by another provider, this initial triage assessment does not replace that evaluation, and the importance of remaining in the ED until their evaluation is complete.  Labs ordered   Dolphus Jenny, PA-C 10/12/23 1303

## 2023-10-12 NOTE — ED Triage Notes (Signed)
 Pt arrived with aunt reporting "I need help" not giving much information or answering questions.Aunt states has these episodes every now and then. Police were  called out to residence yesterday and patent refuse to go.

## 2023-10-12 NOTE — ED Notes (Addendum)
 Pt pulled the doors off of the bathroom and threw them in the floor. Continuing to yell.

## 2023-10-12 NOTE — Progress Notes (Signed)
 Pt has been accepted to [facility] on TOMORROW  10/14/2023 Bed assignment: Unit 700   Pt meets inpatient criteria per: Phebe Colla NP  Attending Physician will be: Jannifer Franklin MD   Report can be called to: [phone]  Pt can arrive TOMORROW AFTER 8 AM   Care Team Notified: Phebe Colla NP, Jacquelynn Cree NT, Elza Rafter RN  [place] Wilfrid Hyser LCSW-A   10/12/2023 3:50 PM

## 2023-10-12 NOTE — ED Notes (Signed)
 Pt refused oral medications and said "you are going to have to give me a shot if you want me to stop." "I know how this works."

## 2023-10-12 NOTE — ED Notes (Signed)
 Pt aunt at bedside, stated patient had not slept in 7 days.  Aunt is requesting placement on psych unit

## 2023-10-12 NOTE — ED Notes (Addendum)
 Pt continues to grow more agitated. Pt making verbal threats at staff and keeps yelling for them to "leave me the fuck alone." Pt taking the mats out of the doorframe and moving them around/sitting on them. Pt is not cooperative with staff and is not able to be redirected at this time. Provider notified, and medication ordered and administered as noted.

## 2023-10-12 NOTE — ED Notes (Signed)
 Pt belonging's (2 bags) placed in cabinet Hall B while pt's aunt is at bedside. Pt's aunt will take pt's belongings when she leaves. Pt aunt made aware of 30 min visitation policy

## 2023-10-12 NOTE — ED Notes (Signed)
 Pt is talking about someone killing her son. Cussing and yelling.

## 2023-10-13 LAB — CBG MONITORING, ED
Glucose-Capillary: 208 mg/dL — ABNORMAL HIGH (ref 70–99)
Glucose-Capillary: 235 mg/dL — ABNORMAL HIGH (ref 70–99)

## 2023-10-13 MED ORDER — DIPHENHYDRAMINE HCL 50 MG/ML IJ SOLN
50.0000 mg | Freq: Once | INTRAMUSCULAR | Status: AC
  Administered 2023-10-13: 50 mg via INTRAMUSCULAR
  Filled 2023-10-13: qty 1

## 2023-10-13 MED ORDER — HALOPERIDOL LACTATE 5 MG/ML IJ SOLN
5.0000 mg | Freq: Once | INTRAMUSCULAR | Status: AC
  Administered 2023-10-13: 5 mg via INTRAMUSCULAR
  Filled 2023-10-13: qty 1

## 2023-10-13 MED ORDER — LORAZEPAM 2 MG/ML IJ SOLN
2.0000 mg | Freq: Once | INTRAMUSCULAR | Status: AC
  Administered 2023-10-13: 2 mg via INTRAMUSCULAR
  Filled 2023-10-13: qty 1

## 2023-10-13 MED ORDER — POTASSIUM CHLORIDE CRYS ER 20 MEQ PO TBCR
20.0000 meq | EXTENDED_RELEASE_TABLET | Freq: Once | ORAL | Status: AC
  Administered 2023-10-13: 20 meq via ORAL
  Filled 2023-10-13: qty 1

## 2023-10-13 MED ORDER — ERYTHROMYCIN 5 MG/GM OP OINT
TOPICAL_OINTMENT | Freq: Three times a day (TID) | OPHTHALMIC | Status: DC
  Filled 2023-10-13: qty 1

## 2023-10-13 NOTE — Progress Notes (Signed)
 LCSW Progress Note  [id number]   CHESNIE CAPELL  10/13/2023  10:27 AM  Description:   Inpatient Psychiatric Referral  Patient was recommended inpatient per Phebe Colla NP There are no available beds at [facility], per [facility] Malva Limes RN. Patient was referred to the following out of network facilities:   [facility] Provider Address Phone Fax  [facility] [address] [place] [address] [phone]  [facility] [address] [place] [address] [phone]  [facility] [address] [place], [place] [address] [phone]  [facility] [address] [place] [address] [phone]  [facility] [address] [place] [address] [phone]  [facility] [address], [place] [address] [phone]  [facility] [address] [place] [address] [phone]  [facility] E[facility] [address] [place], [place] [phone]  [facility] [address] [place] [address] [phone]  [facility] [address], [place] [address] [phone]  [facility] Patient Placement [facility], [place] [phone]      Situation ongoing, CSW to continue following and update chart as more information becomes available.      [place] Najai Waszak, MSW, LCSW  10/13/2023 10:27 AM

## 2023-10-13 NOTE — Progress Notes (Signed)
 10/13/2023 1140 Called sheriff [phone] to transport patient to [facility].

## 2023-10-13 NOTE — Progress Notes (Signed)
 10/13/2023  1040  [facility] and [facility] called to get updated report on patient. Spoke with both facilities update given.

## 2023-10-13 NOTE — ED Notes (Signed)
 Pt has been accepted to [facility] on 10/13/2023 Bed assignment: [facility] C UNIT   Pt meets inpatient criteria per: Phebe Colla NP  Attending Physician will be: Dr. Forrestine Him MD   Report can be called to: [phone]  Pt can arrive ASAP   Care Team Notified: Alona Bene, PMHNP, Vivi Ferns RN, Jacquelynn Cree NT  [place] LCSW-A

## 2023-10-13 NOTE — Progress Notes (Signed)
 Pt has been accepted to [facility] on 10/13/2023 Bed assignment: [facility] C UNIT   Pt meets inpatient criteria per: Phebe Colla NP  Attending Physician will be: Dr. Forrestine Him MD   Report can be called to: [phone]  Pt can arrive ASAP   Care Team Notified: Alona Bene, PMHNP, Vivi Ferns RN, Jacquelynn Cree NT  [place] Evamae Rowen LCSW-A   10/13/2023 11:13 AM

## 2023-10-13 NOTE — ED Notes (Addendum)
 Due to pt's manic behavior is why vitals have not been done, paramedic is aware as well

## 2023-10-13 NOTE — Progress Notes (Signed)
 10/13/2023  1138  Called [facility] [phone]. Report given to [facility].

## 2023-10-13 NOTE — ED Notes (Addendum)
 Pt woke up and came to nurses desk. NT asked how she could assist the pt, and pt started cussing her out. Pt said "You're gonna have to give me more fucking geodon cause I'm not gonna stop!" Pt continued to cuss at staff and started pounding the glass. EMT-P attempted to give pt PO PRN meds, and pt threw pills and cup back at EMT-P and security. MD notified and placed medication orders as noted.   RN Reita Cliche and security came and assisted with IM medication administration. Pt continued to alternate between fits of yelling and crying. Pt continues to cuss at staff and pace between the room and her desk.

## 2023-10-13 NOTE — ED Provider Notes (Signed)
 Emergency Medicine Observation Re-evaluation Note  [name] is a 48 y.o. female, seen on rounds today.  Pt initially presented to the ED for complaints of Psychiatric Evaluation Currently, the patient agitated.  Physical Exam  BP (!) 190/124 (BP Location: Left Arm)   Pulse 95   Temp 98.5 F (36.9 C) (Oral)   Resp (!) 23   Ht 5\' 3"  (1.6 m)   Wt 74.3 kg   SpO2 97%   BMI 29.02 kg/m  Physical Exam Vitals and nursing note reviewed.  Constitutional:      General: She is not in acute distress.    Appearance: She is well-developed.  HENT:     Head: Normocephalic and atraumatic.  Eyes:     Conjunctiva/sclera: Conjunctivae normal.  Pulmonary:     Effort: Pulmonary effort is normal. No respiratory distress.  Musculoskeletal:        General: No swelling.     Cervical back: Neck supple.  Skin:    General: Skin is warm and dry.     Capillary Refill: Capillary refill takes less than 2 seconds.  Neurological:     Mental Status: She is alert.  Psychiatric:        Mood and Affect: Mood normal.      ED Course / MDM  EKG:   I have reviewed the labs performed to date as well as medications administered while in observation.  Recent changes in the last 24 hours include heightened agitation overnight requiring chemical restraint.  Plan  Current plan is for psych placement.    Glendora Score, MD 10/13/23 [phone]

## 2023-10-24 ENCOUNTER — Ambulatory Visit (INDEPENDENT_AMBULATORY_CARE_PROVIDER_SITE_OTHER): Admitting: Psychiatry

## 2023-10-24 ENCOUNTER — Encounter: Payer: Self-pay | Admitting: Family Medicine

## 2023-10-24 ENCOUNTER — Encounter (HOSPITAL_COMMUNITY): Payer: Self-pay | Admitting: Psychiatry

## 2023-10-24 VITALS — BP 157/101 | HR 97 | Temp 98.6°F | Ht 63.0 in | Wt 155.2 lb

## 2023-10-24 DIAGNOSIS — F319 Bipolar disorder, unspecified: Secondary | ICD-10-CM

## 2023-10-24 DIAGNOSIS — F17210 Nicotine dependence, cigarettes, uncomplicated: Secondary | ICD-10-CM

## 2023-10-24 DIAGNOSIS — F411 Generalized anxiety disorder: Secondary | ICD-10-CM

## 2023-10-24 DIAGNOSIS — Z0000 Encounter for general adult medical examination without abnormal findings: Secondary | ICD-10-CM

## 2023-10-24 DIAGNOSIS — Z720 Tobacco use: Secondary | ICD-10-CM

## 2023-10-24 MED ORDER — LURASIDONE HCL 40 MG PO TABS: 40.0000 mg | ORAL_TABLET | Freq: Every day | ORAL | 3 refills | Status: DC

## 2023-10-24 MED ORDER — HYDROXYZINE PAMOATE 25 MG PO CAPS: 25.0000 mg | ORAL_CAPSULE | Freq: Three times a day (TID) | ORAL | 3 refills | Status: AC

## 2023-10-24 MED ORDER — NICOTINE 21 MG/24HR TD PT24: 21.0000 mg | MEDICATED_PATCH | Freq: Every day | TRANSDERMAL | 0 refills | Status: DC

## 2023-10-24 MED ORDER — BENZTROPINE MESYLATE 0.5 MG PO TABS: 0.5000 mg | ORAL_TABLET | Freq: Two times a day (BID) | ORAL | 3 refills | Status: AC

## 2023-10-24 NOTE — Progress Notes (Signed)
 Psychiatric Initial Adult Assessment   Patient Identification: [name] MRN:  [id number] Date of Evaluation:  10/24/2023 [facility]erral [facility]: [facility] Chief Complaint:  "My mania is improving" Visit Diagnosis:    ICD-10-CM   1. Well adult exam  Z00.00 Ambulatory referral to Internal Medicine    2. Tobacco abuse  Z72.0 nicotine (NICODERM CQ) 21 mg/24hr patch    3. Bipolar 1 disorder (HCC)  F31.9 lurasidone (LATUDA) 40 MG TABS tablet    benztropine (COGENTIN) 0.5 MG tablet    4. Generalized anxiety disorder  F41.1 hydrOXYzine (VISTARIL) 25 MG capsule      History of Present Illness: 48 year old female seen today for initial psychiatric evaluation.  She was referred to [facility] by [facility] where she presented on 10/13/2023 to 10/19/2023.  Per chart review patient was admitted after having a manic episode.  Currently she is managed on Latuda 40 mg daily and hydroxyzine 25 mg 3 times daily.  She reports her medications are effective in managing her psychiatric conditions.  Today she is well-groomed, pleasant, cooperative, engaged in conversation.  She informed Clinical research associate that her mania is improving.  Patient notes that she discontinued Latuda and hydroxyzine for a month or more.  She notes that she discontinued the medication because of family stressors.  She informed Clinical research associate that she is attempting to help her elderly grandmother (age 52) with securing land in her name as well as assisting her with ADLs.  She notes that her grandmother's children intern became disgruntled about this and committed her for suicidal ideation.  Patient notes that this was a false accusation and denies current suicidal ideation or past suicidal ideations.  Since her hospitalization and being back on Latuda she notes that her mania is improving.  She informed Clinical research associate that she would like to return to work to maintain her livelihood as some of her bills are behind.  Patient informed Clinical research associate that she is optimistic about life  and walks in her truth.  She notes that she is aware that she has a mental illnesses and has been coping with it for the last 8 years.  She notes that she had not been hospitalized for the last 7 or 8 years.  Patient reports that she has increased anxiety regarding the above.  Today provider conducted a GAD-7 and patient scored a 20.  Provider also conducted PHQ-9 and patient scored a 10.  For the last 2 days she notes that she has been sleeping more than 8 hours.  She does note however that prior to her hospitalization she was not sleeping for days.  Today she denies SI/HI/AVH.  At times she notes that she does feel paranoid.  She notes that her family members tell her that she is paranoid often.  Patient notes that she continues to be irritable, somewhat distractible, and reports that she impulsively spends money.  She does note that these behaviors are improving since restarting her Latuda.  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 She notes that she picks to her hair to cope with stressors. Provider recommended getting bipolar disorder under control prior to starting an antidepressant to help manage Trichotillomania.   Patient reports that the death of her 3 sons father 19 years ago was traumatic as she raised them as a single parent.  She she endorses flashbacks and nightmares.  She does denies avoidant behaviors.  Patient informed Clinical research associate that she has not used marijuana in almost 30 days.  She does Archivist that she smokes 7  to 8 cigarettes daily but would like to discontinue this as well.  Patient agreeable to starting Nicorette patches.  Patient's blood pressure elevated today at 157/101.  She does take antihypertensive medications but notes that she and her PCP are not in agreement about her medication regimen.  Patient referred to [facility] at [facility] for primary care. Patient appointment schedule for April 2nd with Dr. Clent Ridges.  Patient informed writer that her jaw locks after  taking Latuda.  Aims assessment was conducted today and patient scored a 1.  Patient started on Cogentin 0.5 mg twice daily.  She is also agreeable to starting Nicorette CQ 21 mg patches to help manage tobacco dependence.  She will continue other medications as prescribed.    No other concerns at this time.  Associated Signs/Symptoms: Depression Symptoms:  depressed mood, anhedonia, psychomotor agitation, fatigue, feelings of worthlessness/guilt, difficulty concentrating, suicidal attempt, anxiety, loss of energy/fatigue, decreased appetite, (Hypo) Manic Symptoms:  Distractibility, Elevated Mood, Financial Extravagance, Irritable Mood, Anxiety Symptoms:  Excessive Worry, Psychotic Symptoms:  Paranoia, PTSD Symptoms: Had a traumatic exposure:  Note that the death of her children father 19 years ago was traumatic  Past Psychiatric History: Mania, psychosis, tobacco dependence, marijuana dependence, anxiety, bipolar disorder  Previous Psychotropic Medications:  Haldol, Seroquel, Latuda, Zyprexa, Risperdal, Lamictal, Depakote, hydroxyzine, Prozac, Celexa, Lithium (GI UPSET WITH EVERYTHING EXCEPT LATUDA AND HYDROXYZINE)   Substance Abuse History in the last 12 months:  Yes.    Consequences of Substance Abuse: NA  Past Medical History:  Past Medical History:  Diagnosis Date   Back pain    Bipolar 1 disorder (HCC)    Bronchitis    Depressive disorder, not elsewhere classified    DM (diabetes mellitus), type 2 (HCC)    Gestational diabetes    Hidradenitis suppurativa    Obesity    Tobacco abuse     Past Surgical History:  Procedure Laterality Date   HYSTEROSCOPY WITH D & C N/A 05/23/2023   Procedure: DILATATION AND CURETTAGE /HYSTEROSCOPY MYOSURE, LABIAL CYST REMOVAL;  Surgeon: Lorriane Shire, MD;  Location: [facility];  Service: Gynecology;  Laterality: N/A;   TUBAL LIGATION      Family Psychiatric History: Mother schizophrenia, sibling undiagnosed  mental health issues, father undiagnosed mental health conditions, fathers family undiagnosed mental health conditions, two older son substance use (unprescribed pills)  Family History:  Family History  Problem Relation Age of Onset   Hypertension Mother    Schizophrenia Mother    Hypertension Father    Diabetes Father     Social History:   Social History   Socioeconomic History   Marital status: Single    Spouse name: Not on file   Number of children: Not on file   Years of education: Not on file   Highest education level: Some college, no degree  Occupational History   Not on file  Tobacco Use   Smoking status: Every Day    Current packs/day: 1.00    Types: Cigarettes   Smokeless tobacco: Never  Substance and Sexual Activity   Alcohol use: Yes    Alcohol/week: 2.0 standard drinks of alcohol    Types: 2 Shots of liquor per week   Drug use: Yes    Types: Marijuana    Comment: last marijuana use daily last dose 05/22/2023   Sexual activity: Yes  Other Topics Concern   Not on file  Social History Narrative   Not on file   Social Drivers of Health  Financial Resource Strain: High Risk (05/29/2023)   Overall Financial Resource Strain (CARDIA)    Difficulty of Paying Living Expenses: Hard  Food Insecurity: Food Insecurity Present (05/29/2023)   Hunger Vital Sign    Worried About Running Out of Food in the Last Year: Sometimes true    Ran Out of Food in the Last Year: Sometimes true  Transportation Needs: No Transportation Needs (05/29/2023)   PRAPARE - Administrator, Civil Service (Medical): No    Lack of Transportation (Non-Medical): No  Physical Activity: Insufficiently Active (05/29/2023)   Exercise Vital Sign    Days of Exercise per Week: 3 days    Minutes of Exercise per Session: 20 min  Stress: Stress Concern Present (05/29/2023)   [employer] of Occupational Health - Occupational Stress Questionnaire    Feeling of Stress : To some extent   Social Connections: Moderately Isolated (05/29/2023)   Social Connection and Isolation Panel [NHANES]    Frequency of Communication with Friends and Family: Three times a week    Frequency of Social Gatherings with Friends and Family: Once a week    Attends Religious Services: More than 4 times per year    Active Member of [employer] or Organizations: No    Attends Engineer, structural: Not on file    Marital Status: Never married    Additional Social History: Patient resides in [place]. She is single and has 3 sons (21,27,and 19). She smokes 7-8 cigarettes daily. She has been sober from marijuana for almost 30 days. She drinks alcohol socially. She works at [employer]   Allergies:   Allergies  Allergen Reactions   Metformin And Related Nausea Only   Other Other (See Comments)    Patient prefers to NOT be prescribed: Ozempic, Melatonin, or Trazodone    Metabolic Disorder Labs: Lab Results  Component Value Date   HGBA1C 8.1 (H) 07/08/2023   MPG 186 07/08/2023   MPG 148 02/22/2019   Lab Results  Component Value Date   PROLACTIN 92.8 (H) 08/16/2015   Lab Results  Component Value Date   CHOL 228 (H) 07/08/2023   TRIG 113 07/08/2023   HDL 77 07/08/2023   CHOLHDL 3.0 07/08/2023   VLDL 78.2 02/25/2023   LDLCALC 129 (H) 07/08/2023   LDLCALC 143 (H) 02/25/2023   Lab Results  Component Value Date   TSH 0.564 08/14/2015    Therapeutic Level Labs: No results found for: "LITHIUM" No results found for: "CBMZ" No results found for: "VALPROATE"  Current Medications: Current Outpatient Medications  Medication Sig Dispense Refill   benztropine (COGENTIN) 0.5 MG tablet Take 1 tablet (0.5 mg total) by mouth 2 (two) times daily. 60 tablet 3   nicotine (NICODERM CQ) 21 mg/24hr patch Place 1 patch (21 mg total) onto the skin daily. 28 patch 0   acetaminophen (TYLENOL) 500 MG tablet Take 500 mg by mouth every 6 (six) hours as needed for mild pain (pain score 1-3) or headache.      acetaminophen-codeine (TYLENOL #3) 300-30 MG tablet Take 1 tablet by mouth every 6 (six) hours as needed for up to 20 doses for moderate pain (pain score 4-6). (Patient not taking: Reported on 10/12/2023) 20 tablet 0   albuterol (VENTOLIN HFA) 108 (90 Base) MCG/ACT inhaler Inhale 1-2 puffs into the lungs every 4 (four) hours as needed for wheezing or shortness of breath. 1 each 0   ascorbic acid (VITAMIN C) 500 MG tablet Take 500-1,000 mg by mouth daily.  ASHWAGANDHA PO Take 1-2 capsules by mouth daily.     atorvastatin (LIPITOR) 40 MG tablet TAKE 1 TABLET(40 MG) BY MOUTH DAILY (Patient not taking: Reported on 10/12/2023) 90 tablet 1   blood glucose meter kit and supplies KIT Dispense based on patient and insurance preference. Use up to four times daily as directed. 1 each 0   Blood Glucose Monitoring Suppl DEVI 1 each by Does not apply route in the morning, at noon, and at bedtime. May substitute to any manufacturer covered by patient's insurance. 1 each 0   Continuous Glucose Sensor (DEXCOM G7 SENSOR) MISC 1 Device by Does not apply route continuous. (Patient not taking: Reported on 10/12/2023) 9 each 0   ferrous sulfate 325 (65 FE) MG tablet Take 325 mg by mouth daily with breakfast.     Glucose Blood (BLOOD GLUCOSE TEST STRIPS) STRP 1 each by In Vitro route in the morning, at noon, and at bedtime. May substitute to any manufacturer covered by patient's insurance. 100 each 3   hydrOXYzine (VISTARIL) 25 MG capsule Take 1 capsule (25 mg total) by mouth 3 (three) times daily. 90 capsule 3   ibuprofen (ADVIL) 200 MG tablet Take 200-400 mg by mouth every 6 (six) hours as needed for mild pain (pain score 1-3) or headache.     Insulin Pen Needle (PEN NEEDLES) 31G X 8 MM MISC 1 each by Does not apply route 3 (three) times daily. 90 each 0   Lancets (ONETOUCH ULTRASOFT) lancets Use as instructed. 100 each 12   LEVEMIR FLEXPEN 100 UNIT/ML FlexPen ADMINISTER 12 UNITS UNDER THE SKIN TWICE DAILY (Patient  taking differently: Inject 12 Units into the skin daily.) 3 mL 2   lisinopril (ZESTRIL) 10 MG tablet Take 1 tablet (10 mg total) by mouth daily. (Patient not taking: Reported on 10/12/2023) 90 tablet 1   lurasidone (LATUDA) 40 MG TABS tablet Take 1 tablet (40 mg total) by mouth daily with breakfast. TAKE 1 TABLET(40 MG) BY MOUTH DAILY WITH BREAKFAST 30 tablet 3   metFORMIN (GLUCOPHAGE-XR) 500 MG 24 hr tablet Take 1 tablet (500 mg total) by mouth 2 (two) times daily with a meal. (Patient not taking: Reported on 10/12/2023) 120 tablet 2   olopatadine (EYE ALLERGY ITCH/REDNESS REL) 0.1 % ophthalmic solution Place 1 drop into both eyes 2 (two) times daily as needed (FOR SEASONAL ALLERGIES).     ondansetron (ZOFRAN) 4 MG tablet TAKE 1 TABLET(4 MG) BY MOUTH EVERY 8 HOURS AS NEEDED FOR NAUSEA OR VOMITING 12 tablet 0   oxyCODONE (OXY IR/ROXICODONE) 5 MG immediate release tablet Take 1 tablet (5 mg total) by mouth every 4 (four) hours as needed for severe pain (pain score 7-10) or breakthrough pain. (Patient not taking: Reported on 10/12/2023) 6 tablet 0   No current facility-administered medications for this visit.    Musculoskeletal: Strength & Muscle Tone: within normal limits Gait & Station: normal Patient leans: N/A  Psychiatric Specialty Exam: Review of Systems  There were no vitals taken for this visit.There is no height or weight on file to calculate BMI.  General Appearance: Well Groomed  Eye Contact:  Good  Speech:  Clear and Coherent and Normal Rate  Volume:  Normal  Mood:  Anxious  Affect:  Appropriate and Congruent  Thought Process:  Coherent, Goal Directed, and Linear  Orientation:  Full (Time, Place, and Person)  Thought Content:  Logical and Paranoid Ideation  Suicidal Thoughts:  No  Homicidal Thoughts:  No  Memory:  Immediate;  Good Recent;   Good Remote;   Good  Judgement:  Good  Insight:  Good  Psychomotor Activity:  Normal  Concentration:  Concentration: Good and  Attention Span: Good  Recall:  Good  Fund of Knowledge:Good  Language: Good  Akathisia:  No  Handed:  Right  AIMS (if indicated):  done, 1  Assets:  Communication Skills Desire for Improvement Financial Resources/Insurance Housing Social Support Vocational/Educational  ADL's:  Intact  Cognition: WNL  Sleep:  Good   Screenings: AIMS    Flowsheet Row [facility]  AIMS Total Score 0      AUDIT    Flowsheet Row [facility]  Alcohol Use Disorder Identification Test Final Score (AUDIT) 0      GAD-7    Flowsheet Row Office Visit from 10/24/2023 in [facility] Office Visit from 06/02/2023 in [facility] Office Visit from 05/09/2023 in [facility] Office Visit from 04/21/2023 in [facility]  Total GAD-7 Score 20 7 8 11       [employer]    Flowsheet Row Office Visit from 10/24/2023 in [facility] Office Visit from 06/02/2023 in [facility] at [facility] Office Visit from 05/09/2023 in [place] [facility] Office Visit from 04/21/2023 in [facility] at [facility] Office Visit from 02/25/2023 in [facility] [place]  PHQ-2 Total Score 4 2 2 2 2   PHQ-9 Total Score 10 7 10 9 11       Flowsheet Row Office Visit from 10/24/2023 in [facility] ED from 10/12/2023 in [facility] ED from 10/06/2023 in [facility]  C-SSRS RISK CATEGORY No Risk No Risk No Risk       Assessment and Plan: Patient  notes that her mania has improved.  She also notes that her depression has improved but continues to be anxious.  Patient's blood pressure elevated today at 157/101.  She does take antihypertensive medications but notes that she and her PCP are not in agreement about her medication regimen.  Patient referred to [facility] at [facility] for primary care. Patient appointment schedule for April 2nd with Dr. Clent Ridges.  Malaise: Patient informed Clinical research associate that her jaw locks after taking Latuda.  Aims assessment was conducted today and patient scored a 1.  Patient started on Cogentin 0.5 mg twice daily.  She is also agreeable to starting Nicorette CQ 21 mg patches to help manage tobacco dependence.  She will continue other medications as prescribed.   1. Well adult exam (Primary)  - Ambulatory referral to Internal Medicine  2. Tobacco abuse  Start- nicotine (NICODERM CQ) 21 mg/24hr patch; Place 1 patch (21 mg total) onto the skin daily.  Dispense: 28 patch; Refill: 0  3. Bipolar 1 disorder (HCC)  Continue- lurasidone (LATUDA) 40 MG TABS tablet; Take 1 tablet (40 mg total) by mouth daily with breakfast. TAKE 1 TABLET(40 MG) BY MOUTH DAILY WITH BREAKFAST  Dispense: 30 tablet; Refill: 3 Start- benztropine (COGENTIN) 0.5 MG tablet; Take 1 tablet (0.5 mg total) by mouth 2 (two) times daily.  Dispense: 60 tablet; Refill: 3  4. Generalized anxiety disorder  Continue- hydrOXYzine (VISTARIL) 25 MG capsule; Take 1 capsule (25 mg total) by mouth 3 (three) times daily.  Dispense: 90 capsule; Refill: 3   Collaboration of Care: Other provider involved in patient's care AEB PCP  Patient/Guardian was advised Release of Information must be obtained prior to any record release in order to collaborate their care with an outside provider. Patient/Guardian was advised if they have not already done so to contact the registration department to sign all necessary forms in order for [place] to release information regarding their care.    Consent: Patient/Guardian gives verbal consent for treatment and assignment of benefits for services provided during this visit. Patient/Guardian expressed understanding and agreed to proceed.   Shanna Cisco, NP 3/24/202511:55 AM

## 2023-11-02 ENCOUNTER — Encounter: Payer: Self-pay | Admitting: Family Medicine

## 2023-11-02 ENCOUNTER — Ambulatory Visit (INDEPENDENT_AMBULATORY_CARE_PROVIDER_SITE_OTHER): Payer: MEDICAID | Admitting: Family Medicine

## 2023-11-02 VITALS — BP 110/62 | HR 66 | Temp 98.0°F | Wt 163.0 lb

## 2023-11-02 DIAGNOSIS — Z794 Long term (current) use of insulin: Secondary | ICD-10-CM | POA: Diagnosis not present

## 2023-11-02 DIAGNOSIS — E119 Type 2 diabetes mellitus without complications: Secondary | ICD-10-CM | POA: Diagnosis not present

## 2023-11-02 DIAGNOSIS — I10 Essential (primary) hypertension: Secondary | ICD-10-CM | POA: Diagnosis not present

## 2023-11-02 DIAGNOSIS — F319 Bipolar disorder, unspecified: Secondary | ICD-10-CM

## 2023-11-02 LAB — POCT GLYCOSYLATED HEMOGLOBIN (HGB A1C): Hemoglobin A1C: 9.2 % — AB (ref 4.0–5.6)

## 2023-11-02 MED ORDER — LEVEMIR FLEXPEN 100 UNIT/ML ~~LOC~~ SOPN: 12.0000 [IU] | PEN_INJECTOR | Freq: Every morning | SUBCUTANEOUS | Status: AC

## 2023-11-02 MED ORDER — LURASIDONE HCL 40 MG PO TABS: 20.0000 mg | ORAL_TABLET | Freq: Every day | ORAL | Status: AC

## 2023-11-02 NOTE — Progress Notes (Signed)
   Subjective:    Patient ID: [name], female    DOB: [birth year], 48 y.o.   MRN: [id number]  HPI Here to establish after transferring from Dr. Ardyth Harps. She sees Toy Cookey NP for psychiatric care. She sees Dr. Altamese Ritchey for her diabetes. Her last A1c in December was 8.1%. She had been taking Metformin, but she stopped this due to nausea. She is currently using Levemir once daily in the mornings. She had been taking Lisinopril for HTN, but she stopped this as well due to nausea. She says her mania has been well controlled lately by taking Latuda.    Review of Systems  Constitutional: Negative.   Respiratory: Negative.    Cardiovascular: Negative.   Gastrointestinal: Negative.   Genitourinary: Negative.   Neurological: Negative.   Psychiatric/Behavioral:  Negative for agitation, behavioral problems, confusion, decreased concentration, dysphoric mood, hallucinations, self-injury, sleep disturbance and suicidal ideas. The patient is not nervous/anxious.        Objective:   Physical Exam Constitutional:      Appearance: Normal appearance.  Cardiovascular:     Rate and Rhythm: Normal rate and regular rhythm.     Pulses: Normal pulses.     Heart sounds: Normal heart sounds.  Pulmonary:     Effort: Pulmonary effort is normal.     Breath sounds: Normal breath sounds.  Neurological:     Mental Status: She is alert and oriented to person, place, and time.  Psychiatric:        Mood and Affect: Mood normal.        Behavior: Behavior normal.        Thought Content: Thought content normal.           Assessment & Plan:  Her diabetes is not well controlled, so I advised her to see Dr. Roosevelt Locks ASAP. As for the HTN, her BP is stable now so we will simply observe this for now. Her bipolar disorder seems to be stable at this point. We spent a total of (34  ) minutes reviewing records and discussing these issues.  Gershon Crane, MD

## 2023-11-03 ENCOUNTER — Encounter: Payer: Self-pay | Admitting: "Endocrinology

## 2023-11-03 ENCOUNTER — Telehealth: Payer: Self-pay | Admitting: "Endocrinology

## 2023-11-04 NOTE — Telephone Encounter (Signed)
ERROR

## 2023-11-10 ENCOUNTER — Ambulatory Visit (INDEPENDENT_AMBULATORY_CARE_PROVIDER_SITE_OTHER): Payer: MEDICAID | Admitting: Licensed Clinical Social Worker

## 2023-11-10 DIAGNOSIS — F1220 Cannabis dependence, uncomplicated: Secondary | ICD-10-CM | POA: Diagnosis not present

## 2023-11-10 DIAGNOSIS — F633 Trichotillomania: Secondary | ICD-10-CM | POA: Diagnosis not present

## 2023-11-10 DIAGNOSIS — F411 Generalized anxiety disorder: Secondary | ICD-10-CM

## 2023-11-10 DIAGNOSIS — F319 Bipolar disorder, unspecified: Secondary | ICD-10-CM
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
## 2023-11-11 ENCOUNTER — Encounter: Payer: Self-pay | Admitting: "Endocrinology

## 2023-11-11 ENCOUNTER — Ambulatory Visit (INDEPENDENT_AMBULATORY_CARE_PROVIDER_SITE_OTHER): Payer: MEDICAID | Admitting: "Endocrinology

## 2023-11-11 VITALS — BP 124/80 | HR 109 | Ht 63.0 in | Wt 156.0 lb

## 2023-11-11 DIAGNOSIS — E7800 Pure hypercholesterolemia, unspecified: Secondary | ICD-10-CM

## 2023-11-11 DIAGNOSIS — Z794 Long term (current) use of insulin: Secondary | ICD-10-CM | POA: Diagnosis not present

## 2023-11-11 DIAGNOSIS — E1165 Type 2 diabetes mellitus with hyperglycemia: Secondary | ICD-10-CM

## 2023-11-11 MED ORDER — FREESTYLE LIBRE 3 PLUS SENSOR MISC: 3 refills | Status: AC

## 2023-11-11 MED ORDER — PIOGLITAZONE HCL 15 MG PO TABS: 15.0000 mg | ORAL_TABLET | Freq: Every day | ORAL | 1 refills | Status: AC

## 2023-11-11 NOTE — Progress Notes (Signed)
 Outpatient Endocrinology Note Julie Moorhead, MD  11/11/23   [name] 1975-10-27 [id number]  Referring Provider: Philip Aspen, Almira Bar* Primary Care Provider: Philip Aspen, Limmie Patricia, MD Reason for consultation: Subjective   Assessment & Plan  Diagnoses and all orders for this visit:  Uncontrolled type 2 diabetes mellitus with hyperglycemia (HCC)  Long-term insulin use (HCC)  Pure hypercholesterolemia  Other orders -     pioglitazone (ACTOS) 15 MG tablet; Take 1 tablet (15 mg total) by mouth daily.   Diabetes Type II complicated by neuropathy  Lab Results  Component Value Date   GFR 99.76 02/25/2023   Hba1c goal less than 7, current Hba1c is  Lab Results  Component Value Date   HGBA1C 9.2 (A) 11/02/2023   Will recommend the following: Start Actos 15 mg every day  Continue levemir 12 units every day. Increase by 1 unit every day until fasting blood sugar is less than 120. Stay on that dose.   DexCom not covered, ordered libre 3 + Check G tidac and bring log/meter  Does not check BG/did not bring meter   Couldn't do even pill pill of Metformin XR 500 mg every day  Tried ozempic but felt sick so requested to be put back on levemir   C-pep + Ordered DM education previously  Ordered podiatry referral, r/o fungal toes, appt, previously  Ordered labs  No known contraindications/side effects to any of above medications Glucagon discussed and prescribed with refills on 11/11/23  -Last LD and Tg are as follows: Lab Results  Component Value Date   LDLCALC 129 (H) 07/08/2023    Lab Results  Component Value Date   TRIG 113 07/08/2023   -on atorvastatin -Follow low fat diet and exercise   -Blood pressure goal <140/90 - Microalbumin/creatinine goal is < 30 -Last MA/Cr is as follows: Lab Results  Component Value Date   MICROALBUR 4.7 07/08/2023   -was on lisinopril 10 mg at one point -diet changes including salt restriction -limit eating  outside -counseled BP targets per standards of diabetes care -uncontrolled blood pressure can lead to retinopathy, nephropathy and cardiovascular and atherosclerotic heart disease  Reviewed and counseled on: -A1C target -Blood sugar targets -Complications of uncontrolled diabetes  -Checking blood sugar before meals and bedtime and bring log next visit -All medications with mechanism of action and side effects -Hypoglycemia management: rule of 15's, Glucagon Emergency Kit and medical alert ID -low-carb low-fat plate-method diet -At least 20 minutes of physical activity per day -Annual dilated retinal eye exam and foot exam -compliance and follow up needs -follow up as scheduled or earlier if problem gets worse  Call if blood sugar is less than 70 or consistently above 250    Take a 15 gm snack of carbohydrate at bedtime before you go to sleep if your blood sugar is less than 100.    If you are going to fast after midnight for a test or procedure, ask your physician for instructions on how to reduce/decrease your insulin dose.    Call if blood sugar is less than 70 or consistently above 250  -Treating a low sugar by rule of 15  (15 gms of sugar every 15 min until sugar is more than 70) If you feel your sugar is low, test your sugar to be sure If your sugar is low (less than 70), then take 15 grams of a fast acting Carbohydrate (3-4 glucose tablets or glucose gel or 4 ounces of juice or regular  soda) Recheck your sugar 15 min after treating low to make sure it is more than 70 If sugar is still less than 70, treat again with 15 grams of carbohydrate          Don't drive the hour of hypoglycemia  If unconscious/unable to eat or drink by mouth, use glucagon injection or nasal spray baqsimi and call 911. Can repeat again in 15 min if still unconscious.  Return in about 6 weeks (around 12/23/2023).   I have reviewed current medications, nurse's notes, allergies, vital signs, past medical  and surgical history, family medical history, and social history for this encounter. Counseled patient on symptoms, examination findings, lab findings, imaging results, treatment decisions and monitoring and prognosis. The patient understood the recommendations and agrees with the treatment plan. All questions regarding treatment plan were fully answered.  Julie Yukon, MD  11/11/23    History of Present Illness [name] is a 48 y.o. year old female who presents for evaluation of Type II diabetes mellitus.  [name] was first diagnosed around 2012.   Diabetes education +  Home diabetes regimen: Levemir 12 units qam  COMPLICATIONS -  MI/Stroke -  retinopathy + neuropathy + nephropathy/microalbuminuria   BLOOD SUGAR DATA  CGM interpretation: At today's visit, we reviewed her CGM downloads. The full report is scanned in the media. Reviewing the CGM trends, BG are well controlled throughout the day.  Physical Exam  BP 124/80   Pulse (!) 109   Ht 5\' 3"  (1.6 m)   Wt 156 lb (70.8 kg)   SpO2 99%   BMI 27.63 kg/m    Constitutional: well developed, well nourished Head: normocephalic, atraumatic Eyes: sclera anicteric, no redness Neck: supple Lungs: normal respiratory effort Neurology: alert and oriented Skin: dry, no appreciable rashes Musculoskeletal: no appreciable defects Psychiatric: normal mood and affect Diabetic Foot Exam - Simple   No data filed      Current Medications Patient's Medications  New Prescriptions   PIOGLITAZONE (ACTOS) 15 MG TABLET    Take 1 tablet (15 mg total) by mouth daily.  Previous Medications   ALBUTEROL (VENTOLIN HFA) 108 (90 BASE) MCG/ACT INHALER    Inhale 1-2 puffs into the lungs every 4 (four) hours as needed for wheezing or shortness of breath.   ATORVASTATIN (LIPITOR) 40 MG TABLET    TAKE 1 TABLET(40 MG) BY MOUTH DAILY   BENZTROPINE (COGENTIN) 0.5 MG TABLET    Take 1 tablet (0.5 mg total) by mouth 2 (two) times daily.    BLOOD GLUCOSE METER KIT AND SUPPLIES KIT    Dispense based on patient and insurance preference. Use up to four times daily as directed.   BLOOD GLUCOSE MONITORING SUPPL DEVI    1 each by Does not apply route in the morning, at noon, and at bedtime. May substitute to any manufacturer covered by patient's insurance.   CONTINUOUS GLUCOSE SENSOR (DEXCOM G7 SENSOR) MISC    1 Device by Does not apply route continuous.   GLUCOSE BLOOD (BLOOD GLUCOSE TEST STRIPS) STRP    1 each by In Vitro route in the morning, at noon, and at bedtime. May substitute to any manufacturer covered by patient's insurance.   HYDROXYZINE (VISTARIL) 25 MG CAPSULE    Take 1 capsule (25 mg total) by mouth 3 (three) times daily.   INSULIN PEN NEEDLE (PEN NEEDLES) 31G X 8 MM MISC    1 each by Does not apply route 3 (three) times daily.  LANCETS (ONETOUCH ULTRASOFT) LANCETS    Use as instructed.   LEVEMIR FLEXPEN 100 UNIT/ML FLEXPEN    Inject 12 Units into the skin every morning.   LURASIDONE (LATUDA) 40 MG TABS TABLET    Take 0.5 tablets (20 mg total) by mouth daily with breakfast.   NICOTINE (NICODERM CQ) 21 MG/24HR PATCH    Place 1 patch (21 mg total) onto the skin daily.  Modified Medications   No medications on file  Discontinued Medications   No medications on file    Allergies Allergies  Allergen Reactions   Metformin And Related Nausea Only   Other Other (See Comments)    Patient prefers to NOT be prescribed: Ozempic, Melatonin, or Trazodone    Past Medical History Past Medical History:  Diagnosis Date   Back pain    Bipolar 1 disorder (HCC)    Bronchitis    Depressive disorder, not elsewhere classified    DM (diabetes mellitus), type 2 (HCC)    Gestational diabetes    Hidradenitis suppurativa    Obesity    Tobacco abuse     Past Surgical History Past Surgical History:  Procedure Laterality Date   HYSTEROSCOPY WITH D & C N/A 05/23/2023   Procedure: DILATATION AND CURETTAGE /HYSTEROSCOPY MYOSURE, LABIAL  CYST REMOVAL;  Surgeon: Lorriane Shire, MD;  Location: [facility];  Service: Gynecology;  Laterality: N/A;   TUBAL LIGATION      Family History family history includes Diabetes in her father; Hypertension in her father and mother; Schizophrenia in her mother.  Social History Social History   Socioeconomic History   Marital status: Single    Spouse name: Not on file   Number of children: Not on file   Years of education: Not on file   Highest education level: Some college, no degree  Occupational History   Not on file  Tobacco Use   Smoking status: Every Day    Current packs/day: 1.00    Types: Cigarettes   Smokeless tobacco: Never  Substance and Sexual Activity   Alcohol use: Yes    Alcohol/week: 2.0 standard drinks of alcohol    Types: 2 Shots of liquor per week   Drug use: Yes    Types: Marijuana    Comment: last marijuana use daily last dose 05/22/2023   Sexual activity: Yes  Other Topics Concern   Not on file  Social History Narrative   Not on file   Social Drivers of Health   Financial Resource Strain: High Risk (11/01/2023)   Overall Financial Resource Strain (CARDIA)    Difficulty of Paying Living Expenses: Hard  Food Insecurity: Food Insecurity Present (11/01/2023)   Hunger Vital Sign    Worried About Running Out of Food in the Last Year: Sometimes true    Ran Out of Food in the Last Year: Never true  Transportation Needs: No Transportation Needs (11/01/2023)   PRAPARE - Administrator, Civil Service (Medical): No    Lack of Transportation (Non-Medical): No  Physical Activity: Insufficiently Active (11/01/2023)   Exercise Vital Sign    Days of Exercise per Week: 3 days    Minutes of Exercise per Session: 30 min  Stress: Stress Concern Present (11/01/2023)   [employer] of Occupational Health - Occupational Stress Questionnaire    Feeling of Stress : Rather much  Social Connections: Moderately Isolated (11/01/2023)   Social  Connection and Isolation Panel [NHANES]    Frequency of Communication with Friends and Family: More  than three times a week    Frequency of Social Gatherings with Friends and Family: Once a week    Attends Religious Services: More than 4 times per year    Active Member of Clubs or Organizations: No    Attends Banker Meetings: Not on file    Marital Status: Never married  Intimate Partner Violence: Not on file    Lab Results  Component Value Date   HGBA1C 9.2 (A) 11/02/2023   HGBA1C 8.1 (H) 07/08/2023   HGBA1C 8.9 (A) 02/25/2023   Lab Results  Component Value Date   CHOL 228 (H) 07/08/2023   Lab Results  Component Value Date   HDL 77 07/08/2023   Lab Results  Component Value Date   LDLCALC 129 (H) 07/08/2023   Lab Results  Component Value Date   TRIG 113 07/08/2023   Lab Results  Component Value Date   CHOLHDL 3.0 07/08/2023   Lab Results  Component Value Date   CREATININE 0.94 10/12/2023   Lab Results  Component Value Date   GFR 99.76 02/25/2023   Lab Results  Component Value Date   MICROALBUR 4.7 07/08/2023      Component Value Date/Time   NA 135 10/12/2023 1316   K 3.2 (L) 10/12/2023 1316   CL 99 10/12/2023 1316   CO2 25 10/12/2023 1316   GLUCOSE 197 (H) 10/12/2023 1316   BUN 13 10/12/2023 1316   CREATININE 0.94 10/12/2023 1316   CREATININE 0.83 07/12/2023 1052   CALCIUM 9.4 10/12/2023 1316   PROT 8.5 (H) 10/12/2023 1316   ALBUMIN 4.3 10/12/2023 1316   AST 28 10/12/2023 1316   ALT 18 10/12/2023 1316   ALKPHOS 60 10/12/2023 1316   BILITOT 0.7 10/12/2023 1316   GFRNONAA >60 10/12/2023 1316   GFRAA >60 02/22/2019 1424      Latest Ref Rng & Units 10/12/2023    1:16 PM 07/12/2023   10:52 AM 07/08/2023    9:06 AM  BMP  Glucose 70 - 99 mg/dL 409  811  914   BUN 6 - 20 mg/dL 13  14  12    Creatinine 0.44 - 1.00 mg/dL 7.82  9.56  2.13   BUN/Creat Ratio 6 - 22 (calc)  SEE NOTE:  SEE NOTE:   Sodium 135 - 145 mmol/L 135  137  138    Potassium 3.5 - 5.1 mmol/L 3.2  4.5  4.2   Chloride 98 - 111 mmol/L 99  106  106   CO2 22 - 32 mmol/L 25  24  24    Calcium 8.9 - 10.3 mg/dL 9.4  9.1  9.5        Component Value Date/Time   WBC 8.0 10/12/2023 1316   RBC 5.56 (H) 10/12/2023 1316   HGB 12.3 10/12/2023 1316   HCT 36.3 10/12/2023 1316   PLT 548 (H) 10/12/2023 1316   MCV 65.3 (L) 10/12/2023 1316   MCH 22.1 (L) 10/12/2023 1316   MCHC 33.9 10/12/2023 1316   RDW 21.1 (H) 10/12/2023 1316   LYMPHSABS 2.6 02/25/2023 0943   MONOABS 0.7 02/25/2023 0943   EOSABS 0.2 02/25/2023 0943   BASOSABS 0.1 02/25/2023 0943     Parts of this note may have been dictated using voice recognition software. There may be variances in spelling and vocabulary which are unintentional. Not all errors are proofread. Please notify the Thereasa Parkin if any discrepancies are noted or if the meaning of any statement is not clear.

## 2023-11-11 NOTE — Patient Instructions (Signed)

## 2023-11-17 ENCOUNTER — Ambulatory Visit: Admitting: "Endocrinology

## 2023-11-21 ENCOUNTER — Encounter (HOSPITAL_COMMUNITY): Payer: Self-pay

## 2023-11-21 ENCOUNTER — Ambulatory Visit (INDEPENDENT_AMBULATORY_CARE_PROVIDER_SITE_OTHER): Payer: MEDICAID | Admitting: Licensed Clinical Social Worker

## 2023-11-21 DIAGNOSIS — F319 Bipolar disorder, unspecified: Secondary | ICD-10-CM | POA: Diagnosis not present

## 2023-11-21 DIAGNOSIS — F411 Generalized anxiety disorder: Secondary | ICD-10-CM | POA: Diagnosis not present

## 2023-11-21 NOTE — Progress Notes (Signed)
 THERAPIST PROGRESS NOTE   Session Date: 11/21/2023  Session Time: 1400 - 1451 Virtual Visit via Video Note  I connected with [name] on 11/21/23 at  2:00 PM EDT by a video enabled telemedicine application and verified that I am speaking with the correct person using two identifiers.  Location: Patient: Home Provider: Home Office   I discussed the limitations of evaluation and management by telemedicine and the availability of in person appointments. The patient expressed understanding and agreed to proceed.  The patient was advised to call back or seek an in-person evaluation if the symptoms worsen or if the condition fails to improve as anticipated.  I provided 51 minutes of non-face-to-face time during this encounter.  Participation Level: Active  Behavioral Response: CasualAlertEuthymic  Type of Therapy: Individual Therapy  Treatment Goals addressed:  Anxiety   LTG: "Improve abilities in implementing learned skills to better manage anxious symptoms" (Initial)        BH CCP BIPOLAR DISORDER-MANIA/HYPOMANIA    LTG: "To be a better me, and in challenging moments, be able to better control my temper"  (Initial)        OP Depression    LTG: "To stop overthinking about everyday life challenges and improve perspectives"  (Initial)        Progress Towards Goals: Initial  Interventions: CBT, Motivational Interviewing, Solution Focused, and Supportive  Summary: Julie "[name]" is a 48 y.o. female with past psych history of Bipolar I and GAD, presenting for follow-up therapy session in efforts to improve management of bipolar related symptoms.   Patient actively engaged in session, presenting in overall pleasant moods and congruent affect throughout duration.  Actively engaged in introductory check-in, sharing details of recent events since initial intake visit, noting of maintained improvements in managing symptoms.  Engage patient in reflection of initial visit,  processing abilities at identifying individualized treatment goals and barriers preventing patient from doing so.  Further engage patient and reflection of symptoms experienced over the past 2 weeks via PHQ-9 and GAD-7, reflecting an reduction of both depressive and anxious symptoms as well as observances of various symptoms across settings.  Actively engage patient in exploration of overall desired outcomes in relation to presenting bipolar symptoms, and patient's perceived secondary depressive and anxious symptoms.  Patient openly engaged in identifying individual goals for each presenting area of concern.  Patient responded well to interventions. Patient continues to meet criteria for Bipolar I and GAD. Patient will continue to benefit from engagement in outpatient therapy due to being the least restrictive service to meet presenting needs.      11/21/2023    2:06 PM 10/24/2023   11:22 AM 06/02/2023    5:24 PM 05/09/2023    5:22 PM  GAD 7 : Generalized Anxiety Score  Nervous, Anxious, on Edge 1 3 1 2   Control/stop worrying 0 3 1 1   Worry too much - different things 1 3 1 1   Trouble relaxing 1 2 1 1   Restless 1 3 1 1   Easily annoyed or irritable 1 3 1 1   Afraid - awful might happen 0 3 1 1   Total GAD 7 Score 5 20 7 8   Anxiety Difficulty Somewhat difficult Extremely difficult        11/21/2023    2:12 PM 10/24/2023   11:20 AM 06/02/2023    5:24 PM 05/09/2023    5:22 PM 04/21/2023   11:13 AM  Depression screen PHQ 2/9  Decreased Interest 0 2 1 1  1  Down, Depressed, Hopeless 0 2 1 1 1   PHQ - 2 Score 0 4 2 2 2   Altered sleeping 0 3 2 1 1   Tired, decreased energy 3 0 1 1 1   Change in appetite 1 2 1 1 3   Feeling bad or failure about yourself  0 0 1 2 1   Trouble concentrating 0 1 0 1 1  Moving slowly or fidgety/restless 3 0 0 1 0  Suicidal thoughts 0 0 0 1 0  PHQ-9 Score 7 10 7 10 9   Difficult doing work/chores Not difficult at all Not difficult at all      [facility] Visit  from 10/24/2023 in [facility] ED from 10/12/2023 in [facility] Department [facility] ED from 10/06/2023 in [facility]  C-SSRS RISK CATEGORY No Risk No Risk No Risk        Suicidal/Homicidal: Nowithout intent/plan  Therapist Response: Clinician utilized CBT, MI, and solution focused interventions to address pt reported presenting challenges. Clinician actively engaged pt in check-in, assessing mood and affect.  Clinician actively greeted patient at initiation of visit, engaging in introductory check-in, assessing presenting moods and affect and further evoking patient details of daily events and factors contributing to presenting moods.  Engage patient in reflection of initial visit, supporting patient in processing efforts and/or challenges in identifying individualized treatment goals specific to the management of bipolar 1, depression, and anxiety.  Actively engaged pt in re-assessing presenting depressive and anxious symptoms via PHQ-9 and GAD-7, further involving in processing of scores and exploration of observed sxs.  Utilized open-ended questions to further elicit patient's thoughts and perspectives in relation to appropriate individualized treatment goals that will support patient in improving abilities at managing experience symptoms.  Clinician reassessed severity of presenting sxs, and presence of any safety concerns. Clinician provided support and empathy to patient during session.  Plan: Return again in 2 weeks.  Diagnosis:  Encounter Diagnoses  Name Primary?   Bipolar 1 disorder (HCC) Yes   Generalized anxiety disorder     Collaboration of Care: Psychiatrist AEB provider notes available in EHR.  Patient/Guardian was advised Release of Information must be obtained prior to any record release in order to collaborate their care with an outside provider. Patient/Guardian was advised if they have not  already done so to contact the registration department to sign all necessary forms in order for us  to release information regarding their care.   Consent: Patient/Guardian gives verbal consent for treatment and assignment of benefits for services provided during this visit. Patient/Guardian expressed understanding and agreed to proceed.   [name], MSW, LCSW 11/21/2023,  2:51 PM

## 2023-11-28 ENCOUNTER — Encounter (HOSPITAL_COMMUNITY): Payer: Self-pay

## 2023-11-28 ENCOUNTER — Ambulatory Visit (HOSPITAL_COMMUNITY): Admitting: Family

## 2023-11-29 ENCOUNTER — Telehealth: Payer: Self-pay

## 2023-11-29 NOTE — Telephone Encounter (Signed)
 Pharmacy Patient Advocate Encounter   Received notification from CoverMyMeds that prior authorization for Freestyle libre 3 plus is required/requested.   Insurance verification completed.   The patient is insured through [facility] .   Per test claim: PA required; PA submitted to above mentioned insurance via CoverMyMeds Key/confirmation #/EOC [id number] Status is pending

## 2023-11-29 NOTE — Telephone Encounter (Signed)
 Pt needs PA done for libre 3 plus.

## 2023-11-30 NOTE — Telephone Encounter (Signed)
 Pharmacy Patient Advocate Encounter  Received notification from [facility] that Prior Authorization for [facility] 3 plus has been APPROVED through 05/30/2024   PA #/Case ID/Reference #: [id number]

## 2023-12-08 ENCOUNTER — Ambulatory Visit (INDEPENDENT_AMBULATORY_CARE_PROVIDER_SITE_OTHER): Payer: MEDICAID | Admitting: Licensed Clinical Social Worker

## 2023-12-08 DIAGNOSIS — Z91199 Patient's noncompliance with other medical treatment and regimen due to unspecified reason: Secondary | ICD-10-CM

## 2023-12-08 NOTE — Progress Notes (Signed)
 THERAPIST PROGRESS NOTE   Session Date: 12/08/2023  Session Time: 1100  Patient no-showed today's appointment; appointment was for follow up therapy visit.   Patsi Boots, MSW, LCSW 12/08/2023,  11:21 AM

## 2023-12-19 ENCOUNTER — Ambulatory Visit (INDEPENDENT_AMBULATORY_CARE_PROVIDER_SITE_OTHER): Payer: MEDICAID | Admitting: Licensed Clinical Social Worker

## 2023-12-19 ENCOUNTER — Encounter (HOSPITAL_COMMUNITY): Payer: Self-pay | Admitting: Licensed Clinical Social Worker

## 2023-12-19 ENCOUNTER — Encounter (HOSPITAL_COMMUNITY): Payer: Self-pay

## 2023-12-19 DIAGNOSIS — Z91199 Patient's noncompliance with other medical treatment and regimen due to unspecified reason: Secondary | ICD-10-CM

## 2023-12-19 NOTE — Progress Notes (Signed)
 THERAPIST PROGRESS NOTE   Session Date: 12/19/2023  Session Time: 1100  [name]    Clinician attempted to connect with patient for scheduled appointment via Caregility video, sending text request x3 with no response.     Attempt 1: Text: 1105   Attempt 2: Text: 1110    Attempt 3: Text: 1113   Disconnected video visit at:  1115     Per [name] policy, after multiple attempts to reach patient unsuccessfully at appointed time, visit will be coded as a no show.   Today's visit is pt's 2nd consecutive no-show with provider (12/08/23 and 12/19/23), and 3rd no-show with practice, no-show for medication management on 11/28/23, since establishing care on 11/10/23.  All future appt's will be canceled and pt will be released from services. Office will provide formal notice via mail.   Patsi Boots, MSW, LCSW 12/19/2023,  11:14 AM

## 2023-12-20 ENCOUNTER — Ambulatory Visit (HOSPITAL_COMMUNITY)
Admission: RE | Admit: 2023-12-20 | Discharge: 2023-12-20 | Disposition: A | Discharge status: Home / Self Care | Payer: MEDICAID | Source: Ambulatory Visit | Attending: Emergency Medicine | Admitting: Emergency Medicine

## 2023-12-20 ENCOUNTER — Encounter (HOSPITAL_COMMUNITY): Payer: Self-pay

## 2023-12-20 VITALS — BP 150/79 | HR 64 | Temp 98.4°F | Resp 16

## 2023-12-20 DIAGNOSIS — Z3202 Encounter for pregnancy test, result negative: Secondary | ICD-10-CM

## 2023-12-20 DIAGNOSIS — R1032 Left lower quadrant pain: Secondary | ICD-10-CM

## 2023-12-20 DIAGNOSIS — N898 Other specified noninflammatory disorders of vagina: Secondary | ICD-10-CM | POA: Diagnosis present

## 2023-12-20 DIAGNOSIS — R300 Dysuria: Secondary | ICD-10-CM | POA: Diagnosis present

## 2023-12-20 LAB — POCT URINALYSIS DIP (MANUAL ENTRY)
Bilirubin, UA: NEGATIVE
Glucose, UA: NEGATIVE mg/dL
Ketones, POC UA: NEGATIVE mg/dL
Nitrite, UA: NEGATIVE
Protein Ur, POC: 100 mg/dL — AB
Spec Grav, UA: 1.025
Urobilinogen, UA: 0.2 U/dL
pH, UA: 6

## 2023-12-20 LAB — POCT URINE PREGNANCY: Preg Test, Ur: NEGATIVE

## 2023-12-20 MED ORDER — CEPHALEXIN 500 MG PO CAPS: 500.0000 mg | ORAL_CAPSULE | Freq: Four times a day (QID) | ORAL | 0 refills | Status: AC

## 2023-12-20 NOTE — Discharge Instructions (Addendum)
 We are treating you for UTI with keflex , culture is pending, take antibiotic as directed. Avoid taking other person(s) meds as you could have an allergic reaction. Check my chart for results. Avoid sexual activity until results,treatment known and completed. Safe sex with all future sexual activity. We have sent testing for sexually transmitted infections. We will notify you of any positive results once they are received. If required, we will prescribe any medications you might need.  If you have worsening abdominal pain, fever, unable to take antibiotics as directed, go to Er for further evaluation.

## 2023-12-20 NOTE — ED Triage Notes (Addendum)
 Pt states burning with urination for the past 2 days. States she has  been taking AZO at home.

## 2023-12-20 NOTE — ED Provider Notes (Signed)
 [facility]    CSN: [id number] Arrival date & time: 12/20/23  3244      History   Chief Complaint Chief Complaint  Patient presents with   Dysuria    HPI [name] is a 48 y.o. female.   48 year old female, [name], presents to urgent care for evaluation of burning with urination for 5 days.  Patient states she has been taking Azo at home as well as friends "green antibiotic capsule" x 1 day, patient requesting STD testing as well. Pt also having LLQ pain, "history of fibroids". Pt denies N,V,D.  Pt denies new partner, used protection ,but unsure and wants to get checked out for STI  The history is provided by the patient. No language interpreter was used.    Past Medical History:  Diagnosis Date   Back pain    Bipolar 1 disorder (HCC)    Bronchitis    Depressive disorder, not elsewhere classified    DM (diabetes mellitus), type 2 (HCC)    Gestational diabetes    Hidradenitis suppurativa    Obesity    Tobacco abuse     Patient Active Problem List   Diagnosis Date Noted   Dysuria 12/20/2023   Vaginal discharge 12/20/2023   LLQ pain 12/20/2023   HTN (hypertension) 11/02/2023   Cyst of vulva 05/23/2023   Fibroid 05/23/2023   Microalbuminuria due to type 2 diabetes mellitus (HCC) 03/03/2023   Microcytic anemia 03/03/2023   Bipolar 1 disorder (HCC)    DM (diabetes mellitus), type 2 (HCC)    Cannabis use disorder, severe, dependence (HCC) 08/13/2015   Psychosis (HCC) 08/13/2015   Yeast vaginitis 11/07/2014   Vulvovaginal candidiasis 10/16/2014   Night sweats 02/22/2014   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   Asthma, chronic 03/12/2013   Hyperlipidemia associated with type 2 diabetes mellitus (HCC) 12/10/2011   DM (diabetes mellitus), type 2, uncontrolled 12/07/2011   HIDRADENITIS SUPPURATIVA 06/12/2009   OBESITY, UNSPECIFIED 10/31/2008   BACK PAIN, LUMBAR 10/31/2008   Tobacco abuse 09/29/2006   PAPANICOLAOU SMEAR, ABNORMAL 09/29/2006    Past  Surgical History:  Procedure Laterality Date   HYSTEROSCOPY WITH D & C N/A 05/23/2023   Procedure: DILATATION AND CURETTAGE /HYSTEROSCOPY MYOSURE, LABIAL CYST REMOVAL;  Surgeon: Kiki Pelton, MD;  Location: [facility];  Service: Gynecology;  Laterality: N/A;   TUBAL LIGATION      OB History     Gravida  5   Para  3   Term  3   Preterm      AB  2   Living  3      SAB      IAB      Ectopic      Multiple      Live Births  3            Home Medications    Prior to Admission medications   Medication Sig Start Date End Date Taking? Authorizing Provider  cephALEXin  (KEFLEX ) 500 MG capsule Take 1 capsule (500 mg total) by mouth 4 (four) times daily for 7 days. 12/20/23 12/27/23 Yes Eeshan Verbrugge, Eveleen Hinds, NP  albuterol  (VENTOLIN  HFA) 108 (90 Base) MCG/ACT inhaler Inhale 1-2 puffs into the lungs every 4 (four) hours as needed for wheezing or shortness of breath. 05/04/23   Donley Furth, MD  atorvastatin  (LIPITOR) 40 MG tablet TAKE 1 TABLET(40 MG) BY MOUTH DAILY 09/07/23   Zilphia Hilt, Charyl Coppersmith, MD  benztropine  (COGENTIN ) 0.5 MG tablet Take 1 tablet (  0.5 mg total) by mouth 2 (two) times daily. 10/24/23   Arlyne Bering, NP  blood glucose meter kit and supplies KIT Dispense based on patient and insurance preference. Use up to four times daily as directed. 02/22/21   Sharla Davis, PA-C  Blood Glucose Monitoring Suppl DEVI 1 each by Does not apply route in the morning, at noon, and at bedtime. May substitute to any manufacturer covered by patient's insurance. 07/12/23   Motwani, Komal, MD  Continuous Glucose Sensor (DEXCOM G7 SENSOR) MISC 1 Device by Does not apply route continuous. 07/12/23   Motwani, Komal, MD  Continuous Glucose Sensor (FREESTYLE LIBRE 3 PLUS SENSOR) MISC Change sensor every 15 days. 11/11/23   Motwani, Komal, MD  hydrOXYzine  (VISTARIL ) 25 MG capsule Take 1 capsule (25 mg total) by mouth 3 (three) times daily. 10/24/23   Arlyne Bering, NP  Insulin  Pen Needle (PEN NEEDLES) 31G X 8 MM MISC 1 each by Does not apply route 3 (three) times daily. 01/17/21   Sharla Davis, PA-C  Lancets [facility] ULTRASOFT) lancets Use as instructed. 06/18/14   Angel Barba, MD  LEVEMIR  FLEXPEN 100 UNIT/ML FlexPen Inject 12 Units into the skin every morning. 11/02/23   Donley Furth, MD  lurasidone  (LATUDA ) 40 MG TABS tablet Take 0.5 tablets (20 mg total) by mouth daily with breakfast. 11/02/23   Donley Furth, MD  nicotine  (NICODERM CQ ) 21 mg/24hr patch Place 1 patch (21 mg total) onto the skin daily. 10/24/23   Arlyne Bering, NP  pioglitazone  (ACTOS ) 15 MG tablet Take 1 tablet (15 mg total) by mouth daily. 11/11/23   Motwani, Komal, MD  glipiZIDE  (GLUCOTROL ) 10 MG tablet Take 1 tablet (10 mg total) by mouth 2 (two) times daily before a meal. Patient not taking: Reported on 02/22/2019 07/26/17 12/23/19  Nanavati, Ankit, MD  lamoTRIgine  (LAMICTAL ) 200 MG tablet Take 1 tablet (200 mg total) by mouth daily. Patient not taking: Reported on 02/22/2019 08/16/15 12/23/19  Levester Reagin, NP  risperiDONE  (RISPERDAL  M-TABS) 3 MG disintegrating tablet Take 1 tablet (3 mg total) by mouth at bedtime. Patient not taking: Reported on 10/25/2015 08/16/15 12/23/19  Levester Reagin, NP    Family History Family History  Problem Relation Age of Onset   Hypertension Mother    Schizophrenia Mother    Hypertension Father    Diabetes Father     Social History Social History   Tobacco Use   Smoking status: Every Day    Current packs/day: 1.00    Types: Cigarettes   Smokeless tobacco: Never  Substance Use Topics   Alcohol use: Yes    Alcohol/week: 2.0 standard drinks of alcohol    Types: 2 Shots of liquor per week   Drug use: Yes    Types: Marijuana    Comment: last marijuana use daily last dose 05/22/2023     Allergies   Metformin  and related and Other   Review of Systems Review of Systems  Constitutional:  Negative for fever.   Gastrointestinal:  Positive for abdominal pain. Negative for diarrhea, nausea and vomiting.  Genitourinary:  Positive for dysuria, frequency and vaginal discharge.  All other systems reviewed and are negative.    Physical Exam Triage Vital Signs ED Triage Vitals  Encounter Vitals Group     BP 12/20/23 1016 (!) 150/79     Systolic BP Percentile --      Diastolic BP Percentile --      Pulse Rate 12/20/23  1014 64     Resp 12/20/23 1014 16     Temp 12/20/23 1014 98.4 F (36.9 C)     Temp Source 12/20/23 1014 Oral     SpO2 12/20/23 1014 96 %     Weight --      Height --      Head Circumference --      Peak Flow --      Pain Score 12/20/23 1015 0     Pain Loc --      Pain Education --      Exclude from Growth Chart --    No data found.  Updated Vital Signs BP (!) 150/79 (BP Location: Right Arm)   Pulse 64   Temp 98.4 F (36.9 C) (Oral)   Resp 16   LMP 12/01/2023 (Approximate)   SpO2 96%   Visual Acuity Right Eye Distance:   Left Eye Distance:   Bilateral Distance:    Right Eye Near:   Left Eye Near:    Bilateral Near:     Physical Exam Vitals and nursing note reviewed.  Constitutional:      Appearance: Normal appearance. She is well-developed and well-groomed.  HENT:     Head: Normocephalic.  Cardiovascular:     Rate and Rhythm: Normal rate.  Pulmonary:     Effort: Pulmonary effort is normal.  Abdominal:     General: Bowel sounds are normal.     Palpations: Abdomen is soft.     Tenderness: There is abdominal tenderness in the left lower quadrant. There is no guarding or rebound.  Genitourinary:    Comments: Deferred pt self swabbed Neurological:     General: No focal deficit present.     Mental Status: She is alert and oriented to person, place, and time.     GCS: GCS eye subscore is 4. GCS verbal subscore is 5. GCS motor subscore is 6.     Cranial Nerves: No cranial nerve deficit.     Sensory: Sensory deficit present.  Psychiatric:        Behavior:  Behavior is cooperative.      UC Treatments / Results  Labs (all labs ordered are listed, but only abnormal results are displayed) Labs Reviewed  POCT URINALYSIS DIP (MANUAL ENTRY) - Abnormal; Notable for the following components:      Result Value   Clarity, UA cloudy (*)    Blood, UA trace-intact (*)    Protein Ur, POC =100 (*)    Leukocytes, UA Trace (*)    All other components within normal limits  POCT URINE PREGNANCY - Normal  URINE CULTURE  CERVICOVAGINAL ANCILLARY ONLY    EKG   Radiology No results found.  Procedures Procedures (including critical care time)  Medications Ordered in UC Medications - No data to display  Initial Impression / Assessment and Plan / UC Course  I have reviewed the triage vital signs and the nursing notes.  Pertinent labs & imaging results that were available during my care of the patient were reviewed by me and considered in my medical decision making (see chart for details).  Clinical Course as of 12/20/23 1123  Tue Dec 20, 2023  1107 Preg test negative, will treat UTI with keflex  since already taking, urine culture pending, pt aware of test results pending and to check my chart, if additional treatment is indicated we will notify and send in scripts.  Pt verbalized understanding to this provider. [JD]    Clinical Course User Index [  JD] Gredmarie Delange, Eveleen Hinds, NP   Discussed exam findings and plan of care with patient, strict go to ER precautions given.   Patient verbalized understanding to this provider.  Ddx: Dysuria, vaginal discharge, left lower quadrant pain, UTI, pyelonephritis, fibroids, pregnancy Final Clinical Impressions(s) / UC Diagnoses   Final diagnoses:  Dysuria  Vaginal discharge  LLQ pain     Discharge Instructions      We are treating you for UTI with keflex , culture is pending, take antibiotic as directed. Avoid taking other person(s) meds as you could have an allergic reaction. Check my chart for results.  Avoid sexual activity until results,treatment known and completed. Safe sex with all future sexual activity. We have sent testing for sexually transmitted infections. We will notify you of any positive results once they are received. If required, we will prescribe any medications you might need.  If you have worsening abdominal pain, fever, unable to take antibiotics as directed, go to Er for further evaluation.   ED Prescriptions     Medication Sig Dispense Auth. Provider   cephALEXin  (KEFLEX ) 500 MG capsule Take 1 capsule (500 mg total) by mouth 4 (four) times daily for 7 days. 28 capsule Khadir Roam, NP      PDMP not reviewed this encounter.   Peter Brands, NP 12/20/23 1123

## 2023-12-21 LAB — URINE CULTURE
Culture: NO GROWTH
Special Requests: NORMAL

## 2023-12-21 LAB — CERVICOVAGINAL ANCILLARY ONLY
Comment: NEGATIVE
Comment: NEGATIVE
Comment: NEGATIVE
Comment: NEGATIVE
Comment: NEGATIVE
Comment: NORMAL

## 2023-12-22 ENCOUNTER — Ambulatory Visit (HOSPITAL_COMMUNITY): Payer: Self-pay

## 2023-12-28 ENCOUNTER — Ambulatory Visit: Admitting: "Endocrinology

## 2024-01-05 ENCOUNTER — Ambulatory Visit (HOSPITAL_COMMUNITY): Admitting: Licensed Clinical Social Worker

## 2024-01-25 ENCOUNTER — Encounter: Payer: Self-pay | Admitting: Obstetrics and Gynecology

## 2024-01-25 ENCOUNTER — Ambulatory Visit: Payer: MEDICAID | Admitting: Obstetrics and Gynecology

## 2024-01-25 ENCOUNTER — Other Ambulatory Visit: Payer: Self-pay

## 2024-01-25 VITALS — BP 127/80 | HR 90 | Wt 163.7 lb

## 2024-01-25 DIAGNOSIS — N939 Abnormal uterine and vaginal bleeding, unspecified: Secondary | ICD-10-CM | POA: Diagnosis not present

## 2024-01-25 DIAGNOSIS — Z1331 Encounter for screening for depression: Secondary | ICD-10-CM

## 2024-01-25 NOTE — Progress Notes (Signed)
 GYNECOLOGY VISIT  Patient name: [name] MRN [id number]  Date of birth: Nov 09, 1975 Chief Complaint:   Gynecologic Exam   History:  [name] is a 48 y.o. [id number] being seen today for worsened pain that is intermittent and prolonged bleeding.    Discussed the use of AI scribe software for clinical note transcription with the patient, who gave verbal consent to proceed.  History of Present Illness [name] is a 48 year old female who presents with prolonged menstrual bleeding and pelvic pain.  She experiences prolonged menstrual bleeding lasting approximately fifteen days, which has been increasing in duration over time. She is concerned about the possibility of fibroids, as she has had a surgical procedure in the past to remove them.  She describes significant pelvic pain that occurs both with and without bleeding. The pain is sharp, lasting five to ten minutes, and occurs monthly or every other month. Following the sharp pain, she experiences a dull aching pain that persists. She manages the pain with Aleve  and Advil  due it being labeled as 'dual action'.   She has noticed a potential link between her diet, specifically the consumption of red meat, and the exacerbation of her pain. She works third shift and does not engage in strenuous movement, but she is trying to identify dietary triggers for her symptoms.  No changes in bowel habits. She is sexually active and occasionally experiences pain with intercourse, which is similar to the dull pain she feels.   Past Medical History:  Diagnosis Date   Back pain    Bipolar 1 disorder (HCC)    Bronchitis    Depressive disorder, not elsewhere classified    DM (diabetes mellitus), type 2 (HCC)    Gestational diabetes    Hidradenitis suppurativa    Obesity    Tobacco abuse     Past Surgical History:  Procedure Laterality Date   HYSTEROSCOPY WITH D & C N/A 05/23/2023   Procedure: DILATATION AND CURETTAGE  /HYSTEROSCOPY MYOSURE, LABIAL CYST REMOVAL;  Surgeon: Jeralyn Crutch, MD;  Location: [facility];  Service: Gynecology;  Laterality: N/A;   TUBAL LIGATION      The following portions of the patient's history were reviewed and updated as appropriate: allergies, current medications, past family history, past medical history, past social history, past surgical history and problem list.   Health Maintenance:   Last pap     Component Value Date/Time   DIAGPAP  04/21/2023 1049    - Negative for intraepithelial lesion or malignancy (NILM)   HPVHIGH Negative 04/21/2023 1049   ADEQPAP  04/21/2023 1049    Satisfactory for evaluation; transformation zone component ABSENT.     Last mammogram: 01/2023  incomplete   Review of Systems:  Pertinent items are noted in HPI. Comprehensive review of systems was otherwise negative.   Objective:  Physical Exam BP 127/80   Pulse 90   Wt 163 lb 11.2 oz (74.3 kg)   LMP 01/01/2024 (Approximate)   BMI 29.00 kg/m    Physical Exam Vitals and nursing note reviewed.  Constitutional:      Appearance: Normal appearance.  HENT:     Head: Normocephalic and atraumatic.  Pulmonary:     Effort: Pulmonary effort is normal.   Skin:    General: Skin is warm and dry.   Neurological:     General: No focal deficit present.     Mental Status: She is alert.   Psychiatric:  Mood and Affect: Mood normal.        Behavior: Behavior normal.        Thought Content: Thought content normal.        Judgment: Judgment normal.       Assessment & Plan:   Assessment & Plan Uterine Fibroids Prolonged menstrual bleeding likely due to residual fibroid tissue. Ultrasound recommended to evaluate fibroid status. Noted incomplete resection at time of hysteroscopy and therefore probability of descent of remaining fibroid into cavity causing prolonged bleeding.   Pelvic Pain Sharp pain possibly related to ovulation or ovarian cysts based on  monthly occurence; dull pain may be due to pelvic floor muscle irritation. Currently using 2 different NSAIDs - recommend switching to singular NSAID use and can add on tylenol . Tylenol  considered as alternative pending further consultation. - Advise to choose either Aleve  or Advil , not both, to avoid kidney damage. - Discuss potential use of Tylenol  with primary care provider or specialist. - Plan for pelvic floor examination after ultrasound results to assess for myalgia; suspect as result of significant ovulatory pain  - noted that pain related to red meat consumption may be GI in origin and to continue to monitor/track symptoms as it relates to food/consumption   Routine preventative health maintenance measures emphasized.  Carter Quarry, MD Minimally Invasive [facility] for [facility], [facility]

## 2024-02-06 ENCOUNTER — Ambulatory Visit (HOSPITAL_COMMUNITY)
Admission: RE | Admit: 2024-02-06 | Discharge: 2024-02-06 | Disposition: A | Discharge status: Home / Self Care | Payer: MEDICAID | Source: Ambulatory Visit | Attending: Obstetrics and Gynecology | Admitting: Obstetrics and Gynecology

## 2024-02-06 DIAGNOSIS — N939 Abnormal uterine and vaginal bleeding, unspecified: Secondary | ICD-10-CM | POA: Diagnosis present

## 2024-02-07 ENCOUNTER — Ambulatory Visit: Payer: Self-pay | Admitting: Obstetrics and Gynecology

## 2024-02-20 ENCOUNTER — Other Ambulatory Visit (HOSPITAL_COMMUNITY): Payer: Self-pay

## 2024-02-20 ENCOUNTER — Telehealth: Payer: Self-pay

## 2024-02-20 NOTE — Telephone Encounter (Signed)
 Pharmacy Patient Advocate Encounter   Received notification from CoverMyMeds that prior authorization for Ozempic  2 is required/requested.   Insurance verification completed.   The patient is insured through [facility] .   Per test claim: patient has current approved PA for Ozempic . Per chart noes, patient has ozempic  allergy. Ran test claim for [employer] and it requires a PA. Please advise

## 2024-03-01 ENCOUNTER — Other Ambulatory Visit: Payer: Self-pay

## 2024-03-01 ENCOUNTER — Ambulatory Visit: Payer: MEDICAID | Admitting: Obstetrics and Gynecology

## 2024-03-01 ENCOUNTER — Encounter: Payer: Self-pay | Admitting: Obstetrics and Gynecology

## 2024-03-01 VITALS — BP 134/79 | HR 57 | Ht 62.0 in | Wt 159.0 lb

## 2024-03-01 DIAGNOSIS — D219 Benign neoplasm of connective and other soft tissue, unspecified: Secondary | ICD-10-CM

## 2024-03-01 DIAGNOSIS — N939 Abnormal uterine and vaginal bleeding, unspecified: Secondary | ICD-10-CM

## 2024-03-01 MED ORDER — NORETHINDRONE ACETATE 5 MG PO TABS: 5.0000 mg | ORAL_TABLET | Freq: Every day | ORAL | 4 refills | Status: AC

## 2024-03-01 NOTE — Progress Notes (Signed)
 GYNECOLOGY VISIT  Patient name: [name] MRN [id number]  Date of birth: 07-01-1976 Chief Complaint:   follow up after US  results  History:  VRINDA HECKSTALL is a 48 y.o. [id number] being seen today for follow up of AUB. Has been taking aleve  for pain, which has been helping. Here to review US  results and discuss management options.    Past Medical History:  Diagnosis Date   Back pain    Bipolar 1 disorder (HCC)    Bronchitis    Depressive disorder, not elsewhere classified    DM (diabetes mellitus), type 2 (HCC)    Gestational diabetes    Hidradenitis suppurativa    Obesity    Tobacco abuse     Past Surgical History:  Procedure Laterality Date   HYSTEROSCOPY WITH D & C N/A 05/23/2023   Procedure: DILATATION AND CURETTAGE /HYSTEROSCOPY MYOSURE, LABIAL CYST REMOVAL;  Surgeon: Jeralyn Crutch, MD;  Location: [facility];  Service: Gynecology;  Laterality: N/A;   TUBAL LIGATION      The following portions of the patient's history were reviewed and updated as appropriate: allergies, current medications, past family history, past medical history, past social history, past surgical history and problem list.   Health Maintenance:   Last pap     Component Value Date/Time   DIAGPAP  04/21/2023 1049    - Negative for intraepithelial lesion or malignancy (NILM)   HPVHIGH Negative 04/21/2023 1049   ADEQPAP  04/21/2023 1049    Satisfactory for evaluation; transformation zone component ABSENT.    Last mammogram: 2024 BIRADS 0   Review of Systems:  Pertinent items are noted in HPI. Comprehensive review of systems was otherwise negative.   Objective:  Physical Exam BP (!) 154/74   Pulse 62   Ht 5' 2 (1.575 m)   Wt 159 lb (72.1 kg)   LMP 02/23/2024 (Exact Date)   BMI 29.08 kg/m    Physical Exam Vitals and nursing note reviewed.  Constitutional:      Appearance: Normal appearance.  HENT:     Head: Normocephalic and atraumatic.  Pulmonary:      Effort: Pulmonary effort is normal.  Skin:    General: Skin is warm and dry.  Neurological:     General: No focal deficit present.     Mental Status: She is alert.  Psychiatric:        Mood and Affect: Mood normal.        Behavior: Behavior normal.        Thought Content: Thought content normal.        Judgment: Judgment normal.      Labs and Imaging US  PELVIC COMPLETE WITH TRANSVAGINAL Result Date: 02/06/2024 CLINICAL DATA:  pelvic pain, AUB, fibroids EXAM: TRANSABDOMINAL AND TRANSVAGINAL ULTRASOUND OF PELVIS TECHNIQUE: Both transabdominal and transvaginal ultrasound examinations of the pelvis were performed. Transabdominal technique was performed for global imaging of the pelvis including uterus, ovaries, adnexal regions, and pelvic cul-de-sac. It was necessary to proceed with endovaginal exam following the transabdominal exam to visualize the uterus, endometrium, and ovaries. COMPARISON:  February 13, 2023 FINDINGS: Uterus Measurements: 12.2 x 7.5 x 8 cm = volume: 385 mL. Small subserosal or intramural fibroid along the lower uterine segment, measuring 3.3 x 2.5 x 2.3 cm. Endometrium Heterogeneously thickened measuring 32 mm. No focal abnormality visualized. Right ovary Measurements: 2.9 x 2.1 x 2.6 cm cm = volume: 8.6 mL. Normal appearance/no adnexal mass. Dominant follicle measuring 13 mm. Left ovary Measurements:  3.1 x 2.1 x 2.2 cm = volume: 7.4 mL. Normal appearance/no adnexal mass. Other findings No abnormal free fluid. IMPRESSION: 1. Similar heterogeneous thickening of the endometrial complex measuring 32 mm thick. This could represent changes from endometrial hyperplasia, a large endometrial polyp, or a pedunculated submucosal fibroid. A saline infusion sonography versus pelvic MRI with IV contrast could be considered for further characterization. 2. Small subserosal or intramural fibroid along the left lower uterine segment, measuring 3.3 x 2.5 x 2.3 cm, unchanged. These results will be called  to the ordering clinician or representative by the Radiologist Assistant and communication documented in the PACS or [employer]. Electronically Signed   By: Rogelia Myers M.D.   On: 02/06/2024 18:45       Assessment & Plan:   1. Abnormal uterine bleeding (AUB) (Primary) 2. Fibroid Reviewed US  and noted that it is likely that remaining fibroid that could not be resected during prior hysteroscopy has descended into the cavity and causing bleeding. Discussed management options for bleeding. Will start with aygestin  to slow down bleeding. Reviewed surgical options for bleeding. Will plan for hysteroscopic myomectomy with sonata for any residual intramural portion of the myoma.  - Ambulatory Referral For Surgery Scheduling - norethindrone  (AYGESTIN ) 5 MG tablet; Take 1 tablet (5 mg total) by mouth daily.  Dispense: 30 tablet; Refill: 4   Patient desires surgical management with hysteroscopy and sonata.  The risks of surgery were discussed in detail with the patient including but not limited to: bleeding which may require transfusion or reoperation; infection which may require prolonged hospitalization or re-hospitalization and antibiotic therapy; injury to bowel, bladder, ureters and major vessels or other surrounding organs which may lead to other procedures; formation of adhesions; need for additional procedures including laparotomy or subsequent procedures secondary to intraoperative injury or abnormal pathology; thromboembolic phenomenon; incisional problems and other postoperative or anesthesia complications.  Patient was told that the likelihood that her condition and symptoms will be treated effectively with this surgical management was moderate to high; the postoperative expectations were also discussed in detail. The patient also understands the alternative treatment options which were discussed in full. All questions were answered.    Routine preventative health maintenance measures  emphasized.  Carter Quarry, MD Minimally Invasive Gynecologic Surgery Center for [facility], [facility]

## 2024-03-23 ENCOUNTER — Telehealth: Payer: Self-pay

## 2024-03-23 NOTE — Telephone Encounter (Signed)
 I called patient to schedule surgery w/ Dr. Jeralyn on 05/07/24 at [facility] at 1 pm. Patient agreed to surgery date. Pre-op instruction and surgery details were provided by phone.

## 2024-04-19 ENCOUNTER — Telehealth: Payer: Self-pay | Admitting: Obstetrics and Gynecology

## 2024-05-03 ENCOUNTER — Other Ambulatory Visit: Payer: Self-pay

## 2024-05-03 ENCOUNTER — Encounter (HOSPITAL_COMMUNITY): Payer: Self-pay | Admitting: Obstetrics and Gynecology

## 2024-05-03 NOTE — Progress Notes (Addendum)
 PCP - Theophilus Andrews, Tully GRADE, MD  Cardiologist -   PPM/ICD - denies Device Orders - n/a Rep Notified - n/a  Chest x-ray - denies EKG - 08-01-23 Stress Test - denies ECHO - denies Cardiac Cath - denies  CPAP - denies  GLP-1 -denies  Fasting Blood Sugar - Per patient she does not check blood sugar. Per patient it makes her nervous to check blood sugar and has a freestyle libre   Blood Thinner Instructions: denies Aspirin  Instructions: denies  ERAS Protcol - clear liquids until 9:20 am  COVID TEST- n/a  Anesthesia review: yes, patient has hx of DM, bipolar and stated she recently stopped all her medications.  She stated it was to much for her body.  Patient verbally denies any shortness of breath, fever, cough and chest pain during phone call   -------------  SDW INSTRUCTIONS given:  Your procedure is scheduled on May 07, 2024.  Report to [facility] Main Entrance A at 9:50 A.M., and check in at the Admitting office.  Call this number if you have problems the morning of surgery:  [phone]   Remember:  Do not eat after midnight the night before your surgery  You may drink clear liquids until 9:20 the morning of your surgery.   Clear liquids allowed are: Water , Non-Citrus Juices (without pulp), Carbonated Beverages, Clear Tea, Black Coffee Only, and Gatorade    Take these medicines the morning of surgery with A SIP OF WATER   albuterol  (VENTOLIN  HFA)   As of today, STOP taking any Aspirin  (unless otherwise instructed by your surgeon) Aleve , Naproxen , Ibuprofen , Motrin , Advil , Goody's, BC's, all herbal medications, fish oil, and all vitamins.                     Do not wear jewelry, make up, or nail polish            Do not wear lotions, powders, perfumes/colognes, or deodorant.            Do not shave 48 hours prior to surgery.  Men may shave face and neck.            Do not bring valuables to the hospital.            [facility] is not responsible for  any belongings or valuables.  Do NOT Smoke (Tobacco/Vaping) 24 hours prior to your procedure If you use a CPAP at night, you may bring all equipment for your overnight stay.   Contacts, glasses, dentures or bridgework may not be worn into surgery.      For patients admitted to the hospital, discharge time will be determined by your treatment team.   Patients discharged the day of surgery will not be allowed to drive home, and someone needs to stay with them for 24 hours.    Special instructions:   [place]- Preparing For Surgery  Before surgery, you can play an important role. Because skin is not sterile, your skin needs to be as free of germs as possible. You can reduce the number of germs on your skin by washing with CHG (chlorahexidine gluconate) Soap before surgery.  CHG is an antiseptic cleaner which kills germs and bonds with the skin to continue killing germs even after washing.    Oral Hygiene is also important to reduce your risk of infection.  Remember - BRUSH YOUR TEETH THE MORNING OF SURGERY WITH YOUR REGULAR TOOTHPASTE  Please do not use if you have an  allergy to CHG or antibacterial soaps. If your skin becomes reddened/irritated stop using the CHG.  Do not shave (including legs and underarms) for at least 48 hours prior to first CHG shower. It is OK to shave your face.  Please follow these instructions carefully.   Shower the NIGHT BEFORE SURGERY and the MORNING OF SURGERY with DIAL Soap.   Pat yourself dry with a CLEAN TOWEL.  Wear CLEAN PAJAMAS to bed the night before surgery  Place CLEAN SHEETS on your bed the night of your first shower and DO NOT SLEEP WITH PETS.   Day of Surgery: Please shower morning of surgery  Wear Clean/Comfortable clothing the morning of surgery Do not apply any deodorants/lotions.   Remember to brush your teeth WITH YOUR REGULAR TOOTHPASTE.   Questions were answered. Patient verbalized understanding of instructions.

## 2024-05-04 NOTE — Anesthesia Preprocedure Evaluation (Addendum)
 Anesthesia Evaluation  Patient identified by MRN, date of birth, ID band Patient awake    Reviewed: Allergy & Precautions, H&P , NPO status , Patient's Chart, lab work & pertinent test results  History of Anesthesia Complications Negative for: history of anesthetic complications  Airway Mallampati: I  TM Distance: >3 FB Neck ROM: Full    Dental  (+) Loose, Poor Dentition,    Pulmonary asthma , neg sleep apnea, neg COPD, Current SmokerPatient did not abstain from smoking.   Pulmonary exam normal breath sounds clear to auscultation       Cardiovascular Exercise Tolerance: Good METShypertension, Pt. on medications (-) CAD and (-) Past MI Normal cardiovascular exam(-) dysrhythmias  Rhythm:Regular Rate:Normal - Systolic murmurs    Neuro/Psych  PSYCHIATRIC DISORDERS  Depression Bipolar Disorder   negative neurological ROS     GI/Hepatic negative GI ROS, Neg liver ROS,neg GERD  ,,  Endo/Other  diabetes, Type 2  Was taking insulin  up to a month ago, stopped it herself. Blood glucose today in the 150s.  Renal/GU negative Renal ROS  negative genitourinary   Musculoskeletal negative musculoskeletal ROS (+)    Abdominal Normal abdominal exam  (+)   Peds negative pediatric ROS (+)  Hematology   Anesthesia Other Findings Past Medical History: No date: Back pain No date: Bipolar 1 disorder (HCC) No date: Bronchitis No date: Bronchitis No date: Depressive disorder, not elsewhere classified No date: DM (diabetes mellitus), type 2 (HCC) No date: Gestational diabetes No date: Hidradenitis suppurativa No date: Obesity No date: Tobacco abuse  Reproductive/Obstetrics negative OB ROS                              Anesthesia Physical Anesthesia Plan  ASA: 3  Anesthesia Plan: General   Post-op Pain Management: Tylenol  PO (pre-op)*   Induction: Intravenous  PONV Risk Score and Plan: 3 and  Ondansetron , Midazolam , Treatment may vary due to age or medical condition and Dexamethasone   Airway Management Planned: LMA  Additional Equipment: None  Intra-op Plan:   Post-operative Plan: Extubation in OR  Informed Consent: I have reviewed the patients History and Physical, chart, labs and discussed the procedure including the risks, benefits and alternatives for the proposed anesthesia with the patient or authorized representative who has indicated his/her understanding and acceptance.     Dental advisory given  Plan Discussed with: CRNA  Anesthesia Plan Comments: (Discussed risks of anesthesia with patient, including PONV, sore throat, lip/dental/eye damage. Rare risks discussed as well, such as cardiorespiratory and neurological sequelae, and allergic reactions. Discussed the role of CRNA in patient's perioperative care. Patient understands. Patient counseled on benefits of smoking cessation, and increased perioperative risks associated with continued smoking. )        Anesthesia Quick Evaluation

## 2024-05-04 NOTE — Progress Notes (Signed)
 Anesthesia Chart Review: Same day workup  48 year old female current smoker with pertinent history including bipolar 1 disorder, IDDM 2, asthma.  On preop phone call patient reported that she discontinued all of her medications of her own volition several months ago.  This was not done at the direction of any treating provider.  She is not taking insulin  and not checking her blood sugar.  Last A1c was 9.2 on 11/02/2023.  I discussed this with anesthesiologist Dr. Keneth.  He advised that she is at risk for cancellation on day of surgery if her blood sugar is markedly uncontrolled.    Dr. Ritchie office advised that she is out of the office until 05/07/2024.  I sent a staff message explaining the situation to her inbox which is being covered by Cleotilde Ronal RAMAN, MD; Wenceslao Browning, RN.   Patient will need day of surgery labs and evaluation.   Lynwood Geofm RIGGERS [facility] Short Stay Center/Anesthesiology Phone [phone] 05/04/2024 4:16 PM

## 2024-05-07 ENCOUNTER — Ambulatory Visit (HOSPITAL_COMMUNITY): Payer: MEDICAID | Admitting: Physician Assistant

## 2024-05-07 ENCOUNTER — Ambulatory Visit (HOSPITAL_COMMUNITY)
Admission: RE | Admit: 2024-05-07 | Discharge: 2024-05-07 | Disposition: A | Discharge status: Home / Self Care | Payer: MEDICAID | Attending: Obstetrics and Gynecology | Admitting: Obstetrics and Gynecology

## 2024-05-07 ENCOUNTER — Other Ambulatory Visit (HOSPITAL_COMMUNITY): Payer: Self-pay

## 2024-05-07 ENCOUNTER — Encounter (HOSPITAL_COMMUNITY)
Admission: RE | Disposition: A | Discharge status: Home / Self Care | Payer: Self-pay | Source: Home / Self Care | Attending: Obstetrics and Gynecology

## 2024-05-07 ENCOUNTER — Other Ambulatory Visit: Payer: Self-pay

## 2024-05-07 ENCOUNTER — Encounter (HOSPITAL_COMMUNITY): Payer: Self-pay | Admitting: Obstetrics and Gynecology

## 2024-05-07 DIAGNOSIS — E119 Type 2 diabetes mellitus without complications: Secondary | ICD-10-CM | POA: Insufficient documentation

## 2024-05-07 DIAGNOSIS — J45909 Unspecified asthma, uncomplicated: Secondary | ICD-10-CM | POA: Insufficient documentation

## 2024-05-07 DIAGNOSIS — N938 Other specified abnormal uterine and vaginal bleeding: Secondary | ICD-10-CM | POA: Diagnosis present

## 2024-05-07 DIAGNOSIS — D259 Leiomyoma of uterus, unspecified: Secondary | ICD-10-CM

## 2024-05-07 DIAGNOSIS — D252 Subserosal leiomyoma of uterus: Secondary | ICD-10-CM | POA: Diagnosis present

## 2024-05-07 DIAGNOSIS — F17210 Nicotine dependence, cigarettes, uncomplicated: Secondary | ICD-10-CM

## 2024-05-07 DIAGNOSIS — D219 Benign neoplasm of connective and other soft tissue, unspecified: Secondary | ICD-10-CM

## 2024-05-07 DIAGNOSIS — D251 Intramural leiomyoma of uterus: Secondary | ICD-10-CM | POA: Diagnosis present

## 2024-05-07 DIAGNOSIS — Z79899 Other long term (current) drug therapy: Secondary | ICD-10-CM | POA: Insufficient documentation

## 2024-05-07 DIAGNOSIS — I10 Essential (primary) hypertension: Secondary | ICD-10-CM | POA: Insufficient documentation

## 2024-05-07 DIAGNOSIS — N939 Abnormal uterine and vaginal bleeding, unspecified: Secondary | ICD-10-CM

## 2024-05-07 DIAGNOSIS — F319 Bipolar disorder, unspecified: Secondary | ICD-10-CM | POA: Insufficient documentation

## 2024-05-07 HISTORY — PX: HYSTEROSCOPY WITH MYOMECTOMY: SHX7591

## 2024-05-07 HISTORY — PX: RADIOFREQUENCY ABLATION, LEIOMYOMA, UTERUS, TRANSCERVICAL APPROACH, WITH US GUIDANCE: SHX7624

## 2024-05-07 LAB — COMPREHENSIVE METABOLIC PANEL WITH GFR
ALT: 11 U/L (ref 0–44)
AST: 17 U/L (ref 15–41)
Albumin: 3.3 g/dL — ABNORMAL LOW (ref 3.5–5.0)
Alkaline Phosphatase: 46 U/L (ref 38–126)
Anion gap: 8 (ref 5–15)
BUN: 9 mg/dL (ref 6–20)
CO2: 21 mmol/L — ABNORMAL LOW (ref 22–32)
Calcium: 8.5 mg/dL — ABNORMAL LOW (ref 8.9–10.3)
Chloride: 105 mmol/L (ref 98–111)
Creatinine, Ser: 0.72 mg/dL (ref 0.44–1.00)
GFR, Estimated: >60 mL/min (ref >60)
Glucose, Bld: 137 mg/dL — ABNORMAL HIGH (ref 70–99)
Potassium: 4 mmol/L (ref 3.5–5.1)
Sodium: 134 mmol/L — ABNORMAL LOW (ref 135–145)
Total Bilirubin: 0.4 mg/dL (ref 0.0–1.2)
Total Protein: 6.3 g/dL — ABNORMAL LOW (ref 6.5–8.1)

## 2024-05-07 LAB — CBC
HCT: 31.1 % — ABNORMAL LOW (ref 36.0–46.0)
Hemoglobin: 10.2 g/dL — ABNORMAL LOW (ref 12.0–15.0)
MCH: 21.8 pg — ABNORMAL LOW (ref 26.0–34.0)
MCHC: 32.8 g/dL (ref 30.0–36.0)
MCV: 66.5 fL — ABNORMAL LOW (ref 80.0–100.0)
Platelets: 460 K/uL — ABNORMAL HIGH (ref 150–400)
RBC: 4.68 MIL/uL (ref 3.87–5.11)
RDW: 18.3 % — ABNORMAL HIGH (ref 11.5–15.5)
WBC: 7.7 K/uL (ref 4.0–10.5)
nRBC: 0 % (ref 0.0–0.2)

## 2024-05-07 LAB — GLUCOSE, CAPILLARY
Glucose-Capillary: 151 mg/dL — ABNORMAL HIGH (ref 70–99)
Glucose-Capillary: 128 mg/dL — ABNORMAL HIGH (ref 70–99)
Glucose-Capillary: 153 mg/dL — ABNORMAL HIGH (ref 70–99)

## 2024-05-07 LAB — POCT PREGNANCY, URINE: Preg Test, Ur: NEGATIVE

## 2024-05-07 SURGERY — HYSTEROSCOPY WITH MYOMECTOMY
Anesthesia: General | Site: Uterus

## 2024-05-07 MED ORDER — VASOPRESSIN 20 UNIT/ML IV SOLN
INTRAVENOUS | Status: AC
  Filled 2024-05-07: qty 1

## 2024-05-07 MED ORDER — PROPOFOL 10 MG/ML IV BOLUS
INTRAVENOUS | Status: AC
  Filled 2024-05-07: qty 20

## 2024-05-07 MED ORDER — EPHEDRINE 5 MG/ML INJ
INTRAVENOUS | Status: AC
  Filled 2024-05-07: qty 5

## 2024-05-07 MED ORDER — FENTANYL CITRATE (PF) 250 MCG/5ML IJ SOLN
INTRAMUSCULAR | Status: DC | PRN
  Administered 2024-05-07 (×3): 50 ug via INTRAVENOUS

## 2024-05-07 MED ORDER — ACETAMINOPHEN 500 MG PO TABS
1000.0000 mg | ORAL_TABLET | ORAL | Status: AC
  Administered 2024-05-07: 1000 mg via ORAL
  Filled 2024-05-07: qty 2

## 2024-05-07 MED ORDER — OXYCODONE HCL 5 MG PO TABS: 5.0000 mg | ORAL_TABLET | Freq: Once | ORAL | Status: DC | PRN

## 2024-05-07 MED ORDER — ORAL CARE MOUTH RINSE: 15.0000 mL | Freq: Once | OROMUCOSAL | Status: AC

## 2024-05-07 MED ORDER — FENTANYL CITRATE (PF) 250 MCG/5ML IJ SOLN
INTRAMUSCULAR | Status: AC
  Filled 2024-05-07: qty 5

## 2024-05-07 MED ORDER — LIDOCAINE 2% (20 MG/ML) 5 ML SYRINGE
INTRAMUSCULAR | Status: DC | PRN
  Administered 2024-05-07: 40 mg via INTRAVENOUS

## 2024-05-07 MED ORDER — CHLORHEXIDINE GLUCONATE 0.12 % MT SOLN
15.0000 mL | Freq: Once | OROMUCOSAL | Status: AC
  Administered 2024-05-07: 15 mL via OROMUCOSAL
  Filled 2024-05-07: qty 15

## 2024-05-07 MED ORDER — INSULIN ASPART 100 UNIT/ML IJ SOLN: 0.0000 [IU] | INTRAMUSCULAR | Status: DC | PRN

## 2024-05-07 MED ORDER — BUPIVACAINE HCL (PF) 0.5 % IJ SOLN
INTRAMUSCULAR | Status: DC | PRN
  Administered 2024-05-07: 20 mL

## 2024-05-07 MED ORDER — ACETAMINOPHEN 500 MG PO TABS: 1000.0000 mg | ORAL_TABLET | Freq: Once | ORAL | Status: DC

## 2024-05-07 MED ORDER — LACTATED RINGERS IV SOLN: INTRAVENOUS | Status: DC

## 2024-05-07 MED ORDER — IBUPROFEN 600 MG PO TABS
600.0000 mg | ORAL_TABLET | Freq: Four times a day (QID) | ORAL | 0 refills | Status: AC | PRN
  Filled 2024-05-07: qty 90, 23d supply, fill #0

## 2024-05-07 MED ORDER — SODIUM CHLORIDE (PF) 0.9 % IJ SOLN
INTRAMUSCULAR | Status: AC
Start: 2024-05-07 — End: 2024-05-07
  Filled 2024-05-07: qty 100

## 2024-05-07 MED ORDER — ONDANSETRON HCL 4 MG/2ML IJ SOLN
INTRAMUSCULAR | Status: AC
  Filled 2024-05-07: qty 2

## 2024-05-07 MED ORDER — ACETAMINOPHEN 500 MG PO TABS
1000.0000 mg | ORAL_TABLET | Freq: Four times a day (QID) | ORAL | 0 refills | Status: AC | PRN
  Filled 2024-05-07: qty 120, 15d supply, fill #0

## 2024-05-07 MED ORDER — PROPOFOL 10 MG/ML IV BOLUS
INTRAVENOUS | Status: DC | PRN
  Administered 2024-05-07: 200 mg via INTRAVENOUS

## 2024-05-07 MED ORDER — PHENYLEPHRINE 80 MCG/ML (10ML) SYRINGE FOR IV PUSH (FOR BLOOD PRESSURE SUPPORT)
PREFILLED_SYRINGE | INTRAVENOUS | Status: AC
  Filled 2024-05-07: qty 10

## 2024-05-07 MED ORDER — DEXAMETHASONE SODIUM PHOSPHATE 10 MG/ML IJ SOLN
INTRAMUSCULAR | Status: AC
  Filled 2024-05-07: qty 1

## 2024-05-07 MED ORDER — OXYCODONE HCL 5 MG/5ML PO SOLN: 5.0000 mg | Freq: Once | ORAL | Status: DC | PRN

## 2024-05-07 MED ORDER — KETOROLAC TROMETHAMINE 30 MG/ML IJ SOLN
INTRAMUSCULAR | Status: AC
  Filled 2024-05-07: qty 1

## 2024-05-07 MED ORDER — EPHEDRINE SULFATE-NACL 50-0.9 MG/10ML-% IV SOSY
PREFILLED_SYRINGE | INTRAVENOUS | Status: DC | PRN
Start: 2024-05-07 — End: 2024-05-07
  Administered 2024-05-07 (×2): 2.5 mg via INTRAVENOUS

## 2024-05-07 MED ORDER — SODIUM CHLORIDE 0.9 % IR SOLN
Status: DC | PRN
  Administered 2024-05-07 (×9): 3000 mL

## 2024-05-07 MED ORDER — VASOPRESSIN 20 UNIT/ML IV SOLN
INTRAVENOUS | Status: DC | PRN
  Administered 2024-05-07: 20 mL via INTRAMUSCULAR

## 2024-05-07 MED ORDER — ONDANSETRON HCL 4 MG/2ML IJ SOLN
INTRAMUSCULAR | Status: DC | PRN
Start: 2024-05-07 — End: 2024-05-07
  Administered 2024-05-07: 4 mg via INTRAVENOUS

## 2024-05-07 MED ORDER — FENTANYL CITRATE (PF) 100 MCG/2ML IJ SOLN: 25.0000 ug | INTRAMUSCULAR | Status: DC | PRN

## 2024-05-07 MED ORDER — LIDOCAINE 2% (20 MG/ML) 5 ML SYRINGE
INTRAMUSCULAR | Status: AC
  Filled 2024-05-07: qty 5

## 2024-05-07 MED ORDER — DEXAMETHASONE SODIUM PHOSPHATE 10 MG/ML IJ SOLN
INTRAMUSCULAR | Status: DC | PRN
Start: 2024-05-07 — End: 2024-05-07
  Administered 2024-05-07: 10 mg via INTRAVENOUS

## 2024-05-07 MED ORDER — DROPERIDOL 2.5 MG/ML IJ SOLN: 0.6250 mg | Freq: Once | INTRAMUSCULAR | Status: DC | PRN

## 2024-05-07 MED ORDER — MIDAZOLAM HCL 2 MG/2ML IJ SOLN
INTRAMUSCULAR | Status: DC | PRN
  Administered 2024-05-07: 2 mg via INTRAVENOUS

## 2024-05-07 MED ORDER — MIDAZOLAM HCL 2 MG/2ML IJ SOLN
INTRAMUSCULAR | Status: AC
  Filled 2024-05-07: qty 2

## 2024-05-07 MED ORDER — KETOROLAC TROMETHAMINE 30 MG/ML IJ SOLN
INTRAMUSCULAR | Status: DC | PRN
Start: 2024-05-07 — End: 2024-05-07
  Administered 2024-05-07: 30 mg via INTRAVENOUS

## 2024-05-07 SURGICAL SUPPLY — 31 items
BAG WASTE FLUENT 6L (MISCELLANEOUS) IMPLANT
CATH ROBINSON RED A/P 16FR (CATHETERS) IMPLANT
COVER MAYO STAND STRL (DRAPES) ×1 IMPLANT
DEVICE MYOSURE LITE (MISCELLANEOUS) IMPLANT
DEVICE MYOSURE REACH (MISCELLANEOUS) IMPLANT
ELECT DISPERSIVE SONATA (ELECTRODE) ×2 IMPLANT
ELECTRODE REM PT RTRN 9FT ADLT (ELECTROSURGICAL) ×1 IMPLANT
GAUZE 4X4 16PLY ~~LOC~~+RFID DBL (SPONGE) ×1 IMPLANT
GLOVE BIO SURGEON STRL SZ7 (GLOVE) ×1 IMPLANT
GLOVE BIOGEL PI IND STRL 7.0 (GLOVE) ×1 IMPLANT
GOWN STRL REUS W/ TWL XL LVL3 (GOWN DISPOSABLE) ×1 IMPLANT
HANDPIECE RFA SONATA (MISCELLANEOUS) ×1 IMPLANT
KIT PROCED FLUENT PRO FLT212S (KITS) IMPLANT
KIT PROCEDURE FLUENT (KITS) ×1 IMPLANT
KIT TURNOVER KIT B (KITS) ×1 IMPLANT
NDL ASPIRATION 22 (NEEDLE) IMPLANT
NEEDLE ASPIRATION 22 (NEEDLE) ×1 IMPLANT
PACK VAGINAL MINOR WOMEN LF (CUSTOM PROCEDURE TRAY) ×1 IMPLANT
PAD OB MATERNITY 11 LF (PERSONAL CARE ITEMS) ×1 IMPLANT
SEAL CERVICAL OMNI LOK (ABLATOR) IMPLANT
SEAL ROD LENS SCOPE MYOSURE (ABLATOR) ×1 IMPLANT
SOCKS TISSUE FLUENT (MISCELLANEOUS) IMPLANT
SOL .9 NS 3000ML IRR UROMATIC (IV SOLUTION) IMPLANT
SOLN STERILE WATER 1000 ML (IV SOLUTION) ×1 IMPLANT
SOLN STERILE WATER BTL 1000 ML (IV SOLUTION) ×1 IMPLANT
SYR 50ML LL SCALE MARK (SYRINGE) ×1 IMPLANT
SYR CONTROL 10ML LL (SYRINGE) IMPLANT
SYSTEM TISS REMOVAL MYOSURE XL (MISCELLANEOUS) IMPLANT
TOWEL GREEN STERILE FF (TOWEL DISPOSABLE) ×1 IMPLANT
TRAY FOLEY W/BAG SLVR 14FR (SET/KITS/TRAYS/PACK) ×1 IMPLANT
UNDERPAD 30X36 HEAVY ABSORB (UNDERPADS AND DIAPERS) ×1 IMPLANT

## 2024-05-07 NOTE — Brief Op Note (Signed)
 05/07/2024  2:10 PM  PATIENT:  [name]  48 y.o. female  PRE-OPERATIVE DIAGNOSIS:  Fibroids AUB  POST-OPERATIVE DIAGNOSIS:  FibroidsAUB  PROCEDURE:  Procedure(s) with comments: HYSTEROSCOPY WITH MYOMECTOMY (N/A) - rep will be here RADIOFREQUENCY ABLATION, LEIOMYOMA, UTERUS, TRANSCERVICAL APPROACH, WITH US  GUIDANCE (N/A)  SURGEON:  Surgeons and Role:    DEWAINE Jeralyn Crutch, MD - Primary  PHYSICIAN ASSISTANT: n/a  ASSISTANTS: none   ANESTHESIA:   general and paracervical block  EBL:  20 mL   BLOOD ADMINISTERED:none  DRAINS: none   LOCAL MEDICATIONS USED:  BUPIVICAINE   SPECIMEN:  Source of Specimen:  myoma  DISPOSITION OF SPECIMEN:  PATHOLOGY  COUNTS:  YES  TOURNIQUET:  * No tourniquets in log *  DICTATION: .Note written in EPIC  PLAN OF CARE: Discharge to home after PACU  PATIENT DISPOSITION:  PACU - hemodynamically stable.   Delay start of Pharmacological VTE agent (>24hrs) due to surgical blood loss or risk of bleeding: not applicable

## 2024-05-07 NOTE — Anesthesia Procedure Notes (Addendum)
 Procedure Name: LMA Insertion Date/Time: 05/07/2024 12:38 PM  Performed by: Viviana Almarie DASEN, CRNAPre-anesthesia Checklist: Patient identified, Emergency Drugs available, Suction available, Patient being monitored and Timeout performed Patient Re-evaluated:Patient Re-evaluated prior to induction Oxygen Delivery Method: Circle system utilized Preoxygenation: Pre-oxygenation with 100% oxygen Induction Type: IV induction LMA: LMA inserted LMA Size: 4.0 Number of attempts: 1 Placement Confirmation: breath sounds checked- equal and bilateral and positive ETCO2 Tube secured with: Tape Dental Injury: Teeth and Oropharynx as per pre-operative assessment

## 2024-05-07 NOTE — Discharge Instructions (Signed)
 Post-surgical Instructions, Outpatient Surgery  You may expect to feel dizzy, weak, and drowsy for as long as 24 hours after receiving the medicine that made you sleep (anesthetic). For the first 24 hours after your surgery:   Do not drive a car, ride a bicycle, participate in physical activities, or take public transportation until you are done taking narcotic pain medicines or as directed by Dr. Jeralyn.  Do not drink alcohol or take tranquilizers.  Do not take medicine that has not been prescribed by your physicians.  Do not sign important papers or make important decisions while on narcotic pain medicines.  Have a responsible person with you.   CARE OF INCISION If you have a bandage, you may remove it in one day.  If there are steri-strips or dermabond, just let this loosen on its own.  You may shower on the first day after your surgery.  Do not sit in a tub bath for one week. Avoid heavy lifting (more than 10 pounds/4.5 kilograms), pushing, or pulling.  Avoid activities that may risk injury to your incisions.   PAIN MANAGEMENT Ibuprofen  800mg .  (This is the same as 4-200mg  over the counter tablets of Motrin  or ibuprofen .)  Take this every 6 hours or as needed for cramping.   Acetaminophen  1000mg  (This is the same as 2-500mg  over the counter extra strength tylenol ). Take this every 6 hours for the first 3 days or as needed afterwards for pain  DO'S AND DON'T'S Do not take a tub bath for 4 weeks.  You may shower on the first day after your surgery Do not do any heavy lifting for one to two weeks.  This increases the chance of bleeding. Do move around as you feel able.  Stairs are fine.  You may begin to exercise again as you feel able.  Do not lift any weights for two weeks. Do not put anything in the vagina for two weeks--no tampons, intercourse, or douching.    REGULAR MEDIATIONS/VITAMINS: You may restart all of your regular medications as prescribed. You may restart all of your  vitamins as you normally take them.    PLEASE CALL OR SEEK MEDICAL CARE IF: You have persistent nausea and vomiting.  You have trouble eating or drinking.  You have an oral temperature above 100.5.  You have constipation that is not helped by adjusting diet or increasing fluid intake. Pain medicines are a common cause of constipation.  You have heavy vaginal bleeding You have redness or drainage from your incision(s) or there is increasing pain or tenderness near or in the surgical site.

## 2024-05-07 NOTE — Anesthesia Postprocedure Evaluation (Signed)
 Anesthesia Post Note  Patient: MOANI WEIPERT  Procedure(s) Performed: HYSTEROSCOPY WITH MYOMECTOMY (Uterus) RADIOFREQUENCY ABLATION, LEIOMYOMA, UTERUS, TRANSCERVICAL APPROACH, WITH US  GUIDANCE (Uterus)     Patient location during evaluation: PACU Anesthesia Type: General Level of consciousness: awake and alert Pain management: pain level controlled Vital Signs Assessment: post-procedure vital signs reviewed and stable Respiratory status: spontaneous breathing, nonlabored ventilation and respiratory function stable Cardiovascular status: blood pressure returned to baseline and stable Postop Assessment: no apparent nausea or vomiting Anesthetic complications: no   No notable events documented.  Last Vitals:  Vitals:   05/07/24 1500 05/07/24 1505  BP: (!) 151/75 (!) 150/72  Pulse: 68 74  Resp: 12 20  Temp:  36.4 C  SpO2: 100% 100%    Last Pain:  Vitals:   05/07/24 1505  TempSrc:   PainSc: 3                  Butler Levander Pinal

## 2024-05-07 NOTE — Op Note (Signed)
 [name] PROCEDURE DATE: 05/07/2024  PREOPERATIVE DIAGNOSIS: abnormal uterine bleeding, fibroid  POSTOPERATIVE DIAGNOSIS: abnormal uterine bleeding, fibroid PROCEDURE:    hysteroscopic myomectomy, transcervical radiofrequency ablation of myoma, dilation and curettage SURGEON: Carter Quarry, MD ASSISTANT:  n/a  INDICATIONS: 48 y.o. [id number] with AUB, FIBROID.  Risks of surgery were discussed with the patient including but not limited to: bleeding which may require transfusion; infection which may require antibiotics; injury to surrounding organs; need for additional procedures including laparotomy;  and other postoperative/anesthesia complications. Written informed consent was obtained.    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  PROCEDURE DETAILS:  After administering anesthesia, the patient was identified and a time out was performed and procedure was confirmed.  The patient was placed in dorsal lithotomy position and an exam under anesthesia revealed a mobile fibroid uterus and normal adnexa.  A speculum was placed, cervix was grasped with a tenaculum. The operative hysteroscope was inserted and the uterine cavity was visualized in its entirety.  Both tubal ostia were noted to be normal. The uterine cavity was notable for a fundal myoma with calcified/nectrotic surface. 20cc of dilute vasopressin were injected into the myoma and base. The myosure XL was used to resect as much of the myoma as possible while avoiding perforation. Once resected, the hysteroscope was removed.   The Sonata handpiece was then inserted and a complete  uterine survey was performed with the ultrasound.  Sonata fibroid ablation was then performed.  Fibroid 1 ~3 cm, anterior lateral lower uterine segment Ablation     size          time 1              2.0 x 1.3     1:00 2              1.7 x 1.2     0:30 3              1.9 x 1.3     0:50  Fibroid/Adenomyoma 2  ~1.5cm fundal  Ablation     size           time 1              1.9 x 1.3     0:50  All cycles were carried out under direct ultrasound intrauterine guidance and visualization with the ablation guide noted to be within the serosa at all times.  The fibroids appeared ablated with u/s guidance and outgassing noted by U/S appearance. Hysteroscopy was performed which demonstrated a normal appearing cavity with bilateral ostia visualized. Sharp curettage of endometrial cavity was completed.   Sponge, needle, and instrument account was correct x 2.  All instruments were removed from the vagina.  Hemostasis was assured.  The patient was then awakened and taken to the recovery room I stable condition, tolerating the procedure well.   Carter Quarry, MD Minimally Invasive Gynecologic Surgery  Obstetrics and Gynecology, [facility] for [facility], [facility] 05/07/2024

## 2024-05-07 NOTE — Transfer of Care (Signed)
 Immediate Anesthesia Transfer of Care Note  Patient: [name]  Procedure(s) Performed: HYSTEROSCOPY WITH MYOMECTOMY (Uterus) RADIOFREQUENCY ABLATION, LEIOMYOMA, UTERUS, TRANSCERVICAL APPROACH, WITH US  GUIDANCE (Uterus)  Patient Location: PACU  Anesthesia Type:General  Level of Consciousness: oriented, drowsy, and patient cooperative  Airway & Oxygen Therapy: Patient Spontanous Breathing  Post-op Assessment: Report given to RN, Post -op Vital signs reviewed and stable, Patient moving all extremities X 4, and Patient able to stick tongue midline  Post vital signs: Reviewed and stable  Last Vitals:  Vitals Value Taken Time  BP 145/78 05/07/24 14:20  Temp 97.7   Pulse 79 05/07/24 14:22  Resp 17 05/07/24 14:22  SpO2 100 % 05/07/24 14:22  Vitals shown include unfiled device data.  Last Pain:  Vitals:   05/07/24 1057  TempSrc:   PainSc: 2       Patients Stated Pain Goal: 4 (05/07/24 1057)  Complications: No notable events documented.

## 2024-05-07 NOTE — H&P (Signed)
 OB/GYN Pre-Op History and Physical  [name] is a 48 y.o. [id number] presenting for surgical management of AUB and fibroids.       Past Medical History:  Diagnosis Date   Back pain    Bipolar 1 disorder (HCC)    Bronchitis    Bronchitis    Depressive disorder, not elsewhere classified    DM (diabetes mellitus), type 2 (HCC)    Gestational diabetes    Hidradenitis suppurativa    Obesity    Tobacco abuse     Past Surgical History:  Procedure Laterality Date   HYSTEROSCOPY WITH D & C N/A 05/23/2023   Procedure: DILATATION AND CURETTAGE /HYSTEROSCOPY MYOSURE, LABIAL CYST REMOVAL;  Surgeon: Jeralyn Crutch, MD;  Location: [facility];  Service: Gynecology;  Laterality: N/A;   TUBAL LIGATION      OB History  Gravida Para Term Preterm AB Living  5 3 3  2 3   SAB IAB Ectopic Multiple Live Births      3    # Outcome Date GA Lbr Len/2nd Weight Sex Type Anes PTL Lv  5 Term 2005 [redacted]w[redacted]d       LIV  4 Term 1998 [redacted]w[redacted]d       LIV  3 Term 1993 [redacted]w[redacted]d       LIV  2 AB           1 AB             Social History   Socioeconomic History   Marital status: Single    Spouse name: Not on file   Number of children: Not on file   Years of education: Not on file   Highest education level: Some college, no degree  Occupational History   Not on file  Tobacco Use   Smoking status: Every Day    Current packs/day: 1.00    Types: Cigarettes   Smokeless tobacco: Never  Vaping Use   Vaping status: Never Used  Substance and Sexual Activity   Alcohol use: Not Currently    Alcohol/week: 2.0 standard drinks of alcohol    Types: 2 Shots of liquor per week   Drug use: Yes    Types: Marijuana    Comment: last time using 04-28-24   Sexual activity: Yes  Other Topics Concern   Not on file  Social History Narrative   Not on file   Social Drivers of Health   Financial Resource Strain: High Risk (11/01/2023)   Overall Financial Resource Strain (CARDIA)    Difficulty of  Paying Living Expenses: Hard  Food Insecurity: Food Insecurity Present (11/01/2023)   Hunger Vital Sign    Worried About Running Out of Food in the Last Year: Sometimes true    Ran Out of Food in the Last Year: Never true  Transportation Needs: No Transportation Needs (11/01/2023)   PRAPARE - Administrator, Civil Service (Medical): No    Lack of Transportation (Non-Medical): No  Physical Activity: Insufficiently Active (11/01/2023)   Exercise Vital Sign    Days of Exercise per Week: 3 days    Minutes of Exercise per Session: 30 min  Stress: Stress Concern Present (11/01/2023)   [employer] of Occupational Health - Occupational Stress Questionnaire    Feeling of Stress : Rather much  Social Connections: Moderately Isolated (11/01/2023)   Social Connection and Isolation Panel    Frequency of Communication with Friends and Family: More than three times a week    Frequency of  Social Gatherings with Friends and Family: Once a week    Attends Religious Services: More than 4 times per year    Active Member of [employer] or Organizations: No    Attends Engineer, structural: Not on file    Marital Status: Never married    Family History  Problem Relation Age of Onset   Hypertension Mother    Schizophrenia Mother    Hypertension Father    Diabetes Father     Medications Prior to Admission  Medication Sig Dispense Refill Last Dose/Taking   ascorbic acid (VITAMIN C) 500 MG tablet Take 250 mg by mouth daily as needed.   Past Week   lurasidone  (LATUDA ) 40 MG TABS tablet Take 0.5 tablets (20 mg total) by mouth daily with breakfast. (Patient taking differently: Take 20 mg by mouth daily as needed.)   Past Month   vitamin B-12 (CYANOCOBALAMIN) 500 MCG tablet Take 250 mcg by mouth daily as needed.   Past Month   albuterol  (VENTOLIN  HFA) 108 (90 Base) MCG/ACT inhaler Inhale 1-2 puffs into the lungs every 4 (four) hours as needed for wheezing or shortness of breath. 1 each 0 More than  a month   atorvastatin  (LIPITOR) 40 MG tablet TAKE 1 TABLET(40 MG) BY MOUTH DAILY (Patient not taking: Reported on 05/03/2024) 90 tablet 1 More than a month   benztropine  (COGENTIN ) 0.5 MG tablet Take 1 tablet (0.5 mg total) by mouth 2 (two) times daily. (Patient not taking: Reported on 05/03/2024) 60 tablet 3 More than a month   blood glucose meter kit and supplies KIT Dispense based on patient and insurance preference. Use up to four times daily as directed. 1 each 0    Blood Glucose Monitoring Suppl DEVI 1 each by Does not apply route in the morning, at noon, and at bedtime. May substitute to any manufacturer covered by patient's insurance. 1 each 0    Continuous Glucose Sensor (DEXCOM G7 SENSOR) MISC 1 Device by Does not apply route continuous. 9 each 0    Continuous Glucose Sensor (FREESTYLE LIBRE 3 PLUS SENSOR) MISC Change sensor every 15 days. 6 each 3    cyclobenzaprine  (FLEXERIL ) 5 MG tablet Take 5 mg by mouth 2 (two) times daily as needed. (Patient not taking: Reported on 05/03/2024)   More than a month   hydrOXYzine  (VISTARIL ) 25 MG capsule Take 1 capsule (25 mg total) by mouth 3 (three) times daily. (Patient not taking: Reported on 05/03/2024) 90 capsule 3 More than a month   Insulin  Pen Needle (PEN NEEDLES) 31G X 8 MM MISC 1 each by Does not apply route 3 (three) times daily. 90 each 0    Lancets (ONETOUCH ULTRASOFT) lancets Use as instructed. 100 each 12    LEVEMIR  FLEXPEN 100 UNIT/ML FlexPen Inject 12 Units into the skin every morning. (Patient taking differently: Inject 12 Units into the skin daily as needed (if sugar).)   More than a month   norethindrone  (AYGESTIN ) 5 MG tablet Take 1 tablet (5 mg total) by mouth daily. (Patient not taking: Reported on 05/03/2024) 30 tablet 4 More than a month   pioglitazone  (ACTOS ) 15 MG tablet Take 1 tablet (15 mg total) by mouth daily. (Patient not taking: Reported on 05/03/2024) 90 tablet 1 More than a month    Allergies  Allergen Reactions    Metformin  And Related Nausea Only   Other Other (See Comments)    Patient prefers to NOT be prescribed: Ozempic , Melatonin, or Trazodone  Review of Systems: Negative except for what is mentioned in HPI.     Physical Exam: BP 122/81   Pulse 63   Temp 98.8 F (37.1 C) (Oral)   Resp 20   Ht 5' 3 (1.6 m)   Wt 72.6 kg   LMP 04/30/2024   SpO2 100%   BMI 28.34 kg/m  CONSTITUTIONAL: Well-developed, well-nourished and in no acute distress.  HENT:  Normocephalic, atraumatic, External right and left ear normal. Oropharynx is clear and moist EYES: Conjunctivae and EOM are normal. Pupils are equal, round, and reactive to light. No scleral icterus.  NECK: Normal range of motion, supple, no masses SKIN: Skin is warm and dry. No rash noted. Not diaphoretic. No erythema. No pallor. NEUROLGIC: Alert and oriented to person, place, and time. Normal reflexes, muscle tone coordination. No cranial nerve deficit noted. PSYCHIATRIC: Normal mood and affect. Normal behavior. Normal judgment and thought content. RESPIRATORY: Normal effort PELVIC: Deferred   Pertinent Labs/Studies:   Results for orders placed or performed during the hospital encounter of 05/07/24 (from the past 72 hours)  CBC per protocol     Status: Abnormal   Collection Time: 05/07/24 10:06 AM  Result Value Ref Range   WBC 7.7 4.0 - 10.5 K/uL   RBC 4.68 3.87 - 5.11 MIL/uL   Hemoglobin 10.2 (L) 12.0 - 15.0 g/dL    Comment: REPEATED TO VERIFY   HCT 31.1 (L) 36.0 - 46.0 %   MCV 66.5 (L) 80.0 - 100.0 fL   MCH 21.8 (L) 26.0 - 34.0 pg   MCHC 32.8 30.0 - 36.0 g/dL   RDW 81.6 (H) 88.4 - 84.4 %   Platelets 460 (H) 150 - 400 K/uL    Comment: REPEATED TO VERIFY   nRBC 0.0 0.0 - 0.2 %    Comment: Performed at [facility], [address], [place], [place] [address]  Comprehensive metabolic panel per protocol     Status: Abnormal   Collection Time: 05/07/24 10:06 AM  Result Value Ref Range   Sodium 134 (L) 135 - 145 mmol/L    Potassium 4.0 3.5 - 5.1 mmol/L   Chloride 105 98 - 111 mmol/L   CO2 21 (L) 22 - 32 mmol/L   Glucose, Bld 137 (H) 70 - 99 mg/dL    Comment: Glucose reference range applies only to samples taken after fasting for at least 8 hours.   BUN 9 6 - 20 mg/dL   Creatinine, Ser 9.27 0.44 - 1.00 mg/dL   Calcium  8.5 (L) 8.9 - 10.3 mg/dL   Total Protein 6.3 (L) 6.5 - 8.1 g/dL   Albumin 3.3 (L) 3.5 - 5.0 g/dL   AST 17 15 - 41 U/L   ALT 11 0 - 44 U/L   Alkaline Phosphatase 46 38 - 126 U/L   Total Bilirubin 0.4 0.0 - 1.2 mg/dL   GFR, Estimated >39 >39 mL/min    Comment: (NOTE) Calculated using the CKD-EPI Creatinine Equation (2021)    Anion gap 8 5 - 15    Comment: Performed at [facility], [address], [place], [place] [address]  Glucose, capillary     Status: Abnormal   Collection Time: 05/07/24 10:30 AM  Result Value Ref Range   Glucose-Capillary 151 (H) 70 - 99 mg/dL    Comment: Glucose reference range applies only to samples taken after fasting for at least 8 hours.  Pregnancy, urine POC     Status: None   Collection Time: 05/07/24  10:31 AM  Result Value Ref Range   Preg Test, Ur NEGATIVE NEGATIVE    Comment:        THE SENSITIVITY OF THIS METHODOLOGY IS >20 mIU/mL.        Assessment and Plan :KAVITHA LANSDALE is a 48 y.o. [id number] here for surgical management of AUB and fibrodis.   Patient desires surgical management with hysteroscopic myomectomy and/or sonata.  The risks of surgery were discussed in detail with the patient including but not limited to: bleeding which may require transfusion or reoperation; infection which may require prolonged hospitalization or re-hospitalization and antibiotic therapy; injury to bowel, bladder, ureters and major vessels or other surrounding organs which may lead to other procedures; formation of adhesions; need for additional procedures including laparotomy or subsequent procedures secondary to intraoperative injury or abnormal pathology;  thromboembolic phenomenon; incisional problems and other postoperative or anesthesia complications.  Patient was told that the likelihood that her condition and symptoms will be treated effectively with this surgical management was moderate to high; the postoperative expectations were also discussed in detail. The patient also understands the alternative treatment options which were discussed in full. All questions were answered.    Jesyka Slaght, M.D. Minimally Invasive Gynecologic Surgery and Pelvic Pain Specialist Attending Obstetrician & Gynecologist, [facility] Practice [facility] for [employer], [facility]

## 2024-05-08 ENCOUNTER — Encounter (HOSPITAL_COMMUNITY): Payer: Self-pay | Admitting: Obstetrics and Gynecology

## 2024-05-09 LAB — SURGICAL PATHOLOGY

## 2024-05-11 ENCOUNTER — Ambulatory Visit: Payer: Self-pay | Admitting: Obstetrics and Gynecology

## 2024-05-11 DIAGNOSIS — E1169 Type 2 diabetes mellitus with other specified complication: Secondary | ICD-10-CM

## 2024-05-11 DIAGNOSIS — Z720 Tobacco use: Secondary | ICD-10-CM

## 2024-05-11 DIAGNOSIS — F319 Bipolar disorder, unspecified: Secondary | ICD-10-CM

## 2024-05-11 DIAGNOSIS — I10 Essential (primary) hypertension: Secondary | ICD-10-CM

## 2024-05-11 DIAGNOSIS — Z0000 Encounter for general adult medical examination without abnormal findings: Secondary | ICD-10-CM

## 2024-05-11 DIAGNOSIS — J4520 Mild intermittent asthma, uncomplicated: Secondary | ICD-10-CM

## 2024-05-11 DIAGNOSIS — E119 Type 2 diabetes mellitus without complications: Secondary | ICD-10-CM

## 2024-05-11 NOTE — Telephone Encounter (Signed)
 Called patient and confirmed ID x2. No cramping and no pain. No vomiting present. Continues to have bleeding but it is light. No issues with elimination. Has viewed pathology results. Reviewed pathology reports and findings and noted that they are benign and consistent with fibroids. Given resection and sonata, will take a few cycles to see response, will plan for follow up in about 2-3 months. Additionally, referral placed for new PCP/internal medicine to establish care as reported in preop that felt was not a good fit with prior PCP and combination of medications caused her to not feel good, so self discontinued. All questions answered.   FINAL MICROSCOPIC DIAGNOSIS:   A. MYOMA:  - Fragments of leiomyoma with degenerative changes.  - Background proliferative endometrium.

## 2024-05-23 ENCOUNTER — Other Ambulatory Visit (HOSPITAL_COMMUNITY): Payer: Self-pay

## 2024-05-23 ENCOUNTER — Telehealth: Payer: Self-pay | Admitting: Pharmacy Technician

## 2024-05-23 NOTE — Telephone Encounter (Signed)
 Pharmacy Patient Advocate Encounter   Received notification from CoverMyMeds that prior authorization for FreeStyle Libre 3 Plus Sensor  is due for renewal.   Insurance verification completed.   The patient is insured through [place] MEDICAID.  Action: PA required; PA submitted to above mentioned insurance via Latent Key/confirmation #/EOC [facility] Status is pending

## 2024-05-24 NOTE — Telephone Encounter (Signed)
 Pharmacy Patient Advocate Encounter  Received notification from [employer] [place] [employer] that Prior Authorization for FreeStyle Libre 3 Plus Sensor  has been DENIED.  Full denial letter will be uploaded to the media tab. See denial reason below.  **Patient needs a more recent appointment and chart notes showing that blood sugar has improved while using this CGM**      PA #/Case ID/Reference #: [id number]

## 2024-07-18 ENCOUNTER — Telehealth: Payer: MEDICAID | Admitting: Obstetrics and Gynecology

## 2024-07-18 DIAGNOSIS — N939 Abnormal uterine and vaginal bleeding, unspecified: Secondary | ICD-10-CM | POA: Diagnosis not present

## 2024-07-18 DIAGNOSIS — D219 Benign neoplasm of connective and other soft tissue, unspecified: Secondary | ICD-10-CM | POA: Diagnosis not present

## 2024-07-18 DIAGNOSIS — Z09 Encounter for follow-up examination after completed treatment for conditions other than malignant neoplasm: Secondary | ICD-10-CM

## 2024-07-18 NOTE — Progress Notes (Signed)
 GYNECOLOGY VIRTUAL VISIT ENCOUNTER NOTE  Provider location: Center for [facility]   Patient location: Home  I connected with [name] on 07/18/2024 at  1:35 PM EST by MyChart Video Encounter and verified that I am speaking with the correct person using two identifiers.   I discussed the limitations, risks, security and privacy concerns of performing an evaluation and management service virtually and the availability of in person appointments. I also discussed with the patient that there may be a patient responsible charge related to this service. The patient expressed understanding and agreed to proceed.   History:  [name] is a 48 y.o. [phone] female being evaluated today for follow up. Pain has diminished significantly. Periods now last about 5 days, every 28 days and the heaviest is normal heavy and not clotting. No problems since the procedure and     Past Medical History:  Diagnosis Date   Back pain    Bipolar 1 disorder (HCC)    Bronchitis    Bronchitis    Depressive disorder, not elsewhere classified    DM (diabetes mellitus), type 2 (HCC)    Gestational diabetes    Hidradenitis suppurativa    Obesity    Tobacco abuse    Past Surgical History:  Procedure Laterality Date   HYSTEROSCOPY WITH D & C N/A 05/23/2023   Procedure: DILATATION AND CURETTAGE /HYSTEROSCOPY MYOSURE, LABIAL CYST REMOVAL;  Surgeon: Jeralyn Crutch, MD;  Location: [facility];  Service: Gynecology;  Laterality: N/A;   HYSTEROSCOPY WITH MYOMECTOMY N/A 05/07/2024   Procedure: HYSTEROSCOPY WITH MYOMECTOMY;  Surgeon: Jeralyn Crutch, MD;  Location: [facility];  Service: Gynecology;  Laterality: N/A;  rep will be here   RADIOFREQUENCY ABLATION, LEIOMYOMA, UTERUS, TRANSCERVICAL APPROACH, WITH US  GUIDANCE N/A 05/07/2024   Procedure: RADIOFREQUENCY ABLATION, LEIOMYOMA, UTERUS, TRANSCERVICAL APPROACH, WITH US  GUIDANCE;  Surgeon: Jeralyn Crutch,  MD;  Location: [facility];  Service: Gynecology;  Laterality: N/A;   TUBAL LIGATION     The following portions of the patient's history were reviewed and updated as appropriate: allergies, current medications, past family history, past medical history, past social history, past surgical history and problem list.   Health Maintenance:      Component Value Date/Time   DIAGPAP  04/21/2023 1049    - Negative for intraepithelial lesion or malignancy (NILM)   HPVHIGH Negative 04/21/2023 1049   ADEQPAP  04/21/2023 1049    Satisfactory for evaluation; transformation zone component ABSENT.     Review of Systems:  Pertinent items noted in HPI and remainder of comprehensive ROS otherwise negative.  Physical Exam:   General:  Alert, oriented and cooperative. Patient appears to be in no acute distress.  Mental Status: Normal mood and affect. Normal behavior. Normal judgment and thought content.   Respiratory: Normal respiratory effort, no problems with respiration noted  Rest of physical exam deferred due to type of encounter  Labs and Imaging FINAL MICROSCOPIC DIAGNOSIS:   A. MYOMA:  - Fragments of leiomyoma with degenerative changes.  - Background proliferative endometrium.    Assessment and Plan:     1. Postop check (Primary) Doing well  2. Abnormal uterine bleeding (AUB) 3. Fibroid Resolved s/p hysteroscopic myomectomy and sonata. Menses are back to baseline. Continue observation and return as needed       I discussed the assessment and treatment plan with the patient. The patient was provided an opportunity to ask questions and all were answered. The patient  agreed with the plan and demonstrated an understanding of the instructions.   The patient was advised to call back or seek an in-person evaluation/go to the ED if the symptoms worsen or if the condition fails to improve as anticipated.  I provided 5 minutes of face-to-face time during this encounter. I also spent 3 minutes  dedicated to the care of this patient including pre-visit review of records, post visit ordering of medications and appropriate tests or procedures, coordinating care and documenting this visit encounter.    Carter Quarry, MD Center for [employer], [facility]
